# Patient Record
Sex: Male | Born: 1950 | Race: White | Hispanic: No | Marital: Married | Smoking: Never smoker
Health system: Southern US, Community
[De-identification: ages and names within clinical notes are randomized; demographics above are authoritative.]

## PROBLEM LIST (undated history)

## (undated) DIAGNOSIS — K4090 Unilateral inguinal hernia, without obstruction or gangrene, not specified as recurrent: Secondary | ICD-10-CM

## (undated) DIAGNOSIS — N401 Enlarged prostate with lower urinary tract symptoms: Secondary | ICD-10-CM

## (undated) DIAGNOSIS — H5213 Myopia, bilateral: Secondary | ICD-10-CM

## (undated) DIAGNOSIS — Z8669 Personal history of other diseases of the nervous system and sense organs: Secondary | ICD-10-CM

## (undated) DIAGNOSIS — F32A Depression, unspecified: Secondary | ICD-10-CM

## (undated) DIAGNOSIS — F329 Major depressive disorder, single episode, unspecified: Secondary | ICD-10-CM

## (undated) DIAGNOSIS — Z981 Arthrodesis status: Secondary | ICD-10-CM

## (undated) DIAGNOSIS — K635 Polyp of colon: Secondary | ICD-10-CM

## (undated) DIAGNOSIS — J309 Allergic rhinitis, unspecified: Secondary | ICD-10-CM

## (undated) DIAGNOSIS — K219 Gastro-esophageal reflux disease without esophagitis: Secondary | ICD-10-CM

## (undated) DIAGNOSIS — T7840XA Allergy, unspecified, initial encounter: Secondary | ICD-10-CM

## (undated) DIAGNOSIS — Z800 Family history of malignant neoplasm of digestive organs: Secondary | ICD-10-CM

## (undated) DIAGNOSIS — E785 Hyperlipidemia, unspecified: Secondary | ICD-10-CM

## (undated) DIAGNOSIS — K649 Unspecified hemorrhoids: Secondary | ICD-10-CM

## (undated) DIAGNOSIS — I219 Acute myocardial infarction, unspecified: Secondary | ICD-10-CM

## (undated) DIAGNOSIS — H52203 Unspecified astigmatism, bilateral: Secondary | ICD-10-CM

## (undated) DIAGNOSIS — I2510 Atherosclerotic heart disease of native coronary artery without angina pectoris: Secondary | ICD-10-CM

## (undated) DIAGNOSIS — Z8739 Personal history of other diseases of the musculoskeletal system and connective tissue: Secondary | ICD-10-CM

## (undated) DIAGNOSIS — I4510 Unspecified right bundle-branch block: Secondary | ICD-10-CM

## (undated) DIAGNOSIS — D649 Anemia, unspecified: Secondary | ICD-10-CM

## (undated) DIAGNOSIS — Z961 Presence of intraocular lens: Secondary | ICD-10-CM

## (undated) DIAGNOSIS — N138 Other obstructive and reflux uropathy: Secondary | ICD-10-CM

## (undated) DIAGNOSIS — R972 Elevated prostate specific antigen [PSA]: Secondary | ICD-10-CM

## (undated) DIAGNOSIS — M1990 Unspecified osteoarthritis, unspecified site: Secondary | ICD-10-CM

## (undated) DIAGNOSIS — H269 Unspecified cataract: Secondary | ICD-10-CM

## (undated) DIAGNOSIS — D689 Coagulation defect, unspecified: Secondary | ICD-10-CM

## (undated) DIAGNOSIS — G43909 Migraine, unspecified, not intractable, without status migrainosus: Secondary | ICD-10-CM

## (undated) DIAGNOSIS — R7303 Prediabetes: Secondary | ICD-10-CM

## (undated) HISTORY — DX: Hyperlipidemia, unspecified: E78.5

## (undated) HISTORY — DX: Personal history of other diseases of the musculoskeletal system and connective tissue: Z87.39

## (undated) HISTORY — DX: Benign prostatic hyperplasia with lower urinary tract symptoms: N40.1

## (undated) HISTORY — PX: EYE SURGERY: SHX253

## (undated) HISTORY — PX: HERNIA REPAIR: SHX51

## (undated) HISTORY — PX: COLONOSCOPY: SHX174

## (undated) HISTORY — DX: Presence of intraocular lens: Z96.1

## (undated) HISTORY — DX: Myopia, bilateral: H52.13

## (undated) HISTORY — DX: Unspecified hemorrhoids: K64.9

## (undated) HISTORY — DX: Polyp of colon: K63.5

## (undated) HISTORY — DX: Atherosclerotic heart disease of native coronary artery without angina pectoris: I25.10

## (undated) HISTORY — DX: Unspecified right bundle-branch block: I45.10

## (undated) HISTORY — DX: Arthrodesis status: Z98.1

## (undated) HISTORY — DX: Allergic rhinitis, unspecified: J30.9

## (undated) HISTORY — PX: CORONARY STENT PLACEMENT: SHX1402

## (undated) HISTORY — DX: Unspecified cataract: H26.9

## (undated) HISTORY — DX: Depression, unspecified: F32.A

## (undated) HISTORY — DX: Unspecified osteoarthritis, unspecified site: M19.90

## (undated) HISTORY — DX: Acute myocardial infarction, unspecified: I21.9

## (undated) HISTORY — DX: Unilateral inguinal hernia, without obstruction or gangrene, not specified as recurrent: K40.90

## (undated) HISTORY — PX: SPINE SURGERY: SHX786

## (undated) HISTORY — DX: Unspecified astigmatism, bilateral: H52.203

## (undated) HISTORY — PX: HEMORRHOID SURGERY: SHX153

## (undated) HISTORY — DX: Major depressive disorder, single episode, unspecified: F32.9

## (undated) HISTORY — PX: POLYPECTOMY: SHX149

## (undated) HISTORY — DX: Prediabetes: R73.03

## (undated) HISTORY — DX: Anemia, unspecified: D64.9

## (undated) HISTORY — DX: Allergy, unspecified, initial encounter: T78.40XA

## (undated) HISTORY — DX: Personal history of other diseases of the nervous system and sense organs: Z86.69

## (undated) HISTORY — DX: Benign prostatic hyperplasia with lower urinary tract symptoms: N13.8

## (undated) HISTORY — PX: PROSTATE BIOPSY: SHX241

## (undated) HISTORY — PX: CATARACT EXTRACTION, BILATERAL: SHX1313

## (undated) HISTORY — PX: COLON SURGERY: SHX602

## (undated) HISTORY — DX: Coagulation defect, unspecified: D68.9

## (undated) HISTORY — DX: Family history of malignant neoplasm of digestive organs: Z80.0

## (undated) HISTORY — DX: Migraine, unspecified, not intractable, without status migrainosus: G43.909

## (undated) HISTORY — DX: Elevated prostate specific antigen (PSA): R97.20

## (undated) HISTORY — PX: RETINAL DETACHMENT SURGERY: SHX105

---
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
## 2000-05-27 ENCOUNTER — Ambulatory Visit (HOSPITAL_COMMUNITY): Admission: RE | Admit: 2000-05-27 | Discharge: 2000-05-27 | Payer: Self-pay | Admitting: Gastroenterology

## 2001-11-15 ENCOUNTER — Encounter: Payer: Self-pay | Admitting: Cardiovascular Disease

## 2001-11-15 ENCOUNTER — Observation Stay (HOSPITAL_COMMUNITY): Admission: EM | Admit: 2001-11-15 | Discharge: 2001-11-15 | Payer: Self-pay | Admitting: Emergency Medicine

## 2001-11-15 ENCOUNTER — Encounter: Payer: Self-pay | Admitting: Emergency Medicine

## 2002-07-20 DIAGNOSIS — D689 Coagulation defect, unspecified: Secondary | ICD-10-CM

## 2002-07-20 HISTORY — DX: Coagulation defect, unspecified: D68.9

## 2002-07-20 HISTORY — PX: CORONARY ARTERY BYPASS GRAFT: SHX141

## 2002-07-20 HISTORY — PX: CHOLECYSTECTOMY: SHX55

## 2003-01-07 ENCOUNTER — Encounter: Payer: Self-pay | Admitting: Emergency Medicine

## 2003-01-07 ENCOUNTER — Inpatient Hospital Stay (HOSPITAL_COMMUNITY): Admission: EM | Admit: 2003-01-07 | Discharge: 2003-01-17 | Payer: Self-pay | Admitting: Emergency Medicine

## 2003-01-09 ENCOUNTER — Encounter: Payer: Self-pay | Admitting: Surgery

## 2003-01-11 ENCOUNTER — Encounter: Payer: Self-pay | Admitting: Surgery

## 2003-01-12 ENCOUNTER — Encounter: Payer: Self-pay | Admitting: Surgery

## 2003-01-13 ENCOUNTER — Encounter: Payer: Self-pay | Admitting: Surgery

## 2003-03-24 ENCOUNTER — Encounter: Payer: Self-pay | Admitting: Emergency Medicine

## 2003-03-24 ENCOUNTER — Emergency Department (HOSPITAL_COMMUNITY): Admission: EM | Admit: 2003-03-24 | Discharge: 2003-03-24 | Payer: Self-pay | Admitting: Emergency Medicine

## 2003-03-30 ENCOUNTER — Ambulatory Visit (HOSPITAL_COMMUNITY): Admission: RE | Admit: 2003-03-30 | Discharge: 2003-03-31 | Payer: Self-pay | Admitting: General Surgery

## 2003-03-30 ENCOUNTER — Encounter: Payer: Self-pay | Admitting: General Surgery

## 2004-07-20 HISTORY — PX: CERVICAL FUSION: SHX112

## 2005-03-19 ENCOUNTER — Ambulatory Visit (HOSPITAL_COMMUNITY): Admission: RE | Admit: 2005-03-19 | Discharge: 2005-03-19 | Payer: Self-pay | Admitting: Gastroenterology

## 2005-03-25 ENCOUNTER — Encounter: Admission: RE | Admit: 2005-03-25 | Discharge: 2005-03-25 | Payer: Self-pay | Admitting: Gastroenterology

## 2005-06-29 ENCOUNTER — Encounter: Admission: RE | Admit: 2005-06-29 | Discharge: 2005-06-29 | Payer: Self-pay | Admitting: Gastroenterology

## 2006-02-03 ENCOUNTER — Encounter: Admission: RE | Admit: 2006-02-03 | Discharge: 2006-02-03 | Payer: Self-pay | Admitting: Gastroenterology

## 2006-04-01 ENCOUNTER — Ambulatory Visit (HOSPITAL_COMMUNITY): Admission: RE | Admit: 2006-04-01 | Discharge: 2006-04-02 | Payer: Self-pay | Admitting: Neurological Surgery

## 2006-04-19 ENCOUNTER — Encounter: Admission: RE | Admit: 2006-04-19 | Discharge: 2006-04-19 | Payer: Self-pay | Admitting: Neurological Surgery

## 2006-07-19 ENCOUNTER — Encounter: Admission: RE | Admit: 2006-07-19 | Discharge: 2006-07-19 | Payer: Self-pay | Admitting: Neurological Surgery

## 2010-09-19 DIAGNOSIS — K635 Polyp of colon: Secondary | ICD-10-CM

## 2010-09-19 HISTORY — DX: Polyp of colon: K63.5

## 2011-07-21 HISTORY — PX: CARDIOVASCULAR STRESS TEST: SHX262

## 2012-12-01 LAB — LIPID PANEL
Cholesterol: 156 (ref 0–200)
HDL: 49 (ref 35–70)
LDL Cholesterol: 82
Triglycerides: 124 (ref 40–160)

## 2012-12-01 LAB — BASIC METABOLIC PANEL
BUN: 17 (ref 4–21)
Creatinine: 1.1 (ref <=1.3)
Glucose: 94
Potassium: 4.6 (ref 3.4–5.3)
Sodium: 140 (ref 137–147)

## 2012-12-01 LAB — HEPATIC FUNCTION PANEL: ALT: 23 (ref 10–40)

## 2013-11-02 LAB — HM COLONOSCOPY

## 2016-05-08 LAB — HEMOGLOBIN A1C: Hemoglobin A1C: 5.8

## 2018-02-07 DIAGNOSIS — M2042 Other hammer toe(s) (acquired), left foot: Secondary | ICD-10-CM | POA: Diagnosis not present

## 2018-02-07 DIAGNOSIS — M205X1 Other deformities of toe(s) (acquired), right foot: Secondary | ICD-10-CM | POA: Diagnosis not present

## 2018-02-07 DIAGNOSIS — M205X2 Other deformities of toe(s) (acquired), left foot: Secondary | ICD-10-CM | POA: Diagnosis not present

## 2018-02-07 DIAGNOSIS — M79671 Pain in right foot: Secondary | ICD-10-CM | POA: Diagnosis not present

## 2018-02-07 DIAGNOSIS — M2041 Other hammer toe(s) (acquired), right foot: Secondary | ICD-10-CM | POA: Diagnosis not present

## 2018-02-07 DIAGNOSIS — L603 Nail dystrophy: Secondary | ICD-10-CM | POA: Diagnosis not present

## 2018-02-07 DIAGNOSIS — M79672 Pain in left foot: Secondary | ICD-10-CM | POA: Diagnosis not present

## 2018-02-11 ENCOUNTER — Encounter: Payer: Self-pay | Admitting: *Deleted

## 2018-02-14 ENCOUNTER — Telehealth: Payer: Self-pay | Admitting: *Deleted

## 2018-02-14 ENCOUNTER — Ambulatory Visit (INDEPENDENT_AMBULATORY_CARE_PROVIDER_SITE_OTHER): Payer: Medicare HMO | Admitting: Family Medicine

## 2018-02-14 ENCOUNTER — Encounter: Payer: Self-pay | Admitting: Family Medicine

## 2018-02-14 VITALS — BP 114/71 | HR 76 | Temp 98.5°F | Resp 16 | Ht 73.0 in | Wt 219.1 lb

## 2018-02-14 DIAGNOSIS — I252 Old myocardial infarction: Secondary | ICD-10-CM

## 2018-02-14 DIAGNOSIS — Z951 Presence of aortocoronary bypass graft: Secondary | ICD-10-CM | POA: Diagnosis not present

## 2018-02-14 DIAGNOSIS — Z800 Family history of malignant neoplasm of digestive organs: Secondary | ICD-10-CM

## 2018-02-14 DIAGNOSIS — Z86010 Personal history of colonic polyps: Secondary | ICD-10-CM | POA: Diagnosis not present

## 2018-02-14 DIAGNOSIS — Z860101 Personal history of adenomatous and serrated colon polyps: Secondary | ICD-10-CM

## 2018-02-14 NOTE — Progress Notes (Signed)
Office Note 02/14/2018  CC:  Chief Complaint  Patient presents with  . Establish Care    moved here 2-3 weeks ago from [place]   HPI:  Gavin [name] is a 67 y.o. male who is here to establish care. Lives in [place]. Went to [place] x 89yrs.  Then [place] x 6 yrs.  Has been 5-6 years since last stress test--done in [place].  This was a "follow up" stress test--he was asymptomatic. No hx of further cardiac sx's, LV function intact.  Exercise: walks when he can, active, no specific exercise routine. R foot pain, saw podiatrist last week, may need surgery on 2nd toe.  CPE with blood work done about 15 mo ago.   Past Medical History:  Diagnosis Date  . Allergic rhinitis   . CAD (coronary artery disease)    CABG x 4 2004 [facility]).  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  . Cataract    Bilat: has lens implants bilat.  . Colon polyp   . Depression   . Family history of colon cancer    Brother: in his 40s.  Marland Kitchen History of retinal detachment    OU  . Migraines   . Myocardial infarct ([place])    2004, when pt's stent clotted.    Past Surgical History:  Procedure Laterality Date  . CERVICAL FUSION  2006   C5/6  . CHOLECYSTECTOMY  2004  . COLONOSCOPY  2014   Repeat 5 yrs per pt report (hx of polyps).  . CORONARY ARTERY BYPASS GRAFT  2004   4  . TONSILLECTOMY  1956    Family History  Problem Relation Age of Onset  . Arthritis Mother   . Diabetes Mother   . Depression Father   . Heart attack Father   . Heart disease Father   . Hyperlipidemia Father   . Hypertension Father   . Stroke Father   . Cancer Brother   . Heart disease Brother   . Hyperlipidemia Brother   . Hypertension Brother   . Diabetes Maternal Grandmother   . Early death Paternal Grandfather     Social History   Socioeconomic History  . Marital status: Married    Spouse name: Not on file  . Number of children: Not on file  . Years of education:  Not on file  . Highest education level: Not on file  Occupational History  . Not on file  Social Needs  . Financial resource strain: Not on file  . Food insecurity:    Worry: Not on file    Inability: Not on file  . Transportation needs:    Medical: Not on file    Non-medical: Not on file  Tobacco Use  . Smoking status: Never Smoker  . Smokeless tobacco: Never Used  Substance and Sexual Activity  . Alcohol use: Yes  . Drug use: Never  . Sexual activity: Yes  Lifestyle  . Physical activity:    Days per week: Not on file    Minutes per session: Not on file  . Stress: Not on file  Relationships  . Social connections:    Talks on phone: Not on file    Gets together: Not on file    Attends religious service: Not on file    Active member of club or organization: Not on file    Attends meetings of clubs or organizations: Not on file    Relationship status: Not on file  .  Intimate partner violence:    Fear of current or ex partner: Not on file    Emotionally abused: Not on file    Physically abused: Not on file    Forced sexual activity: Not on file  Other Topics Concern  . Not on file  Social History Narrative   Married, 2 grown children.  No grandchildren.   Masters Degree: Marketing executive.   [name]; retired Chief Financial Officer.   Alc: 2 drinks per week.   No tob.    Outpatient Medications Prior to Visit  Medication Sig Dispense Refill  . aspirin EC 81 MG tablet Take 81 mg by mouth daily.    Marland Kitchen atorvastatin (LIPITOR) 40 MG tablet Take 40 mg by mouth daily.    . Cholecalciferol (VITAMIN D PO) Take 1 capsule by mouth daily.    . citalopram (CELEXA) 20 MG tablet Take 20 mg by mouth daily.    . Coenzyme Q10 (COQ-10) 200 MG CAPS Take 1 capsule by mouth daily.     No facility-administered medications prior to visit.     Allergies  Allergen Reactions  . Penicillins Anaphylaxis    ROS Review of Systems  Constitutional: Negative for fatigue and fever.  HENT: Negative for congestion  and sore throat.   Eyes: Negative for visual disturbance.  Respiratory: Negative for cough and chest tightness.   Cardiovascular: Negative for chest pain, palpitations and leg swelling.  Gastrointestinal: Negative for abdominal pain and nausea.  Genitourinary: Negative for dysuria.  Musculoskeletal: Negative for back pain and joint swelling.  Skin: Negative for rash.  Neurological: Negative for weakness and headaches.  Hematological: Negative for adenopathy.    PE; Blood pressure 114/71, pulse 76, temperature 98.5 F (36.9 C), temperature source Oral, resp. rate 16, height 6\' 1"  (1.854 m), weight 219 lb 2 oz (99.4 kg), SpO2 97 %. Gen: Alert, well appearing.  Patient is oriented to person, place, time, and situation. AFFECT: pleasant, lucid thought and speech. CV: RRR, no m/r/g.   LUNGS: CTA bilat, nonlabored resps, good aeration in all lung fields. ABD: soft, NT/ND EXT: no clubbing, cyanosis, or edema.   Pertinent labs:  No results found for: TSH No results found for: WBC, HGB, HCT, MCV, PLT No results found for: CREATININE, BUN, NA, K, CL, CO2 No results found for: ALT, AST, GGT, ALKPHOS, BILITOT No results found for: CHOL No results found for: HDL No results found for: LDLCALC No results found for: TRIG No results found for: CHOLHDL No results found for: PSA No results found for: HGBA1C   ASSESSMENT AND PLAN:   New pt; will obtain pertinent old records.  1) Coronary artery anomoly/anatomic variant?--pt ended up having an MI and then getting CABG after a stent occluded. Has been asymptomatic since that time (2007). Last stress test approx 5 yrs ago in [place]--he was asymptomatic and pt reports stress test was normal. Refer to local cardiologist: pt is interested to see if any f/u stress testing or echo is needed at this time. He takes ASA and statin as secondary prevention.  2) FH of colon ca in brother (in his 53s), with personal hx of colon polyps: last  colonoscopy 2014--he is due for repeat. Refer to local GI. Needs colonoscopy this year.  An After Visit Summary was printed and given to the patient.  FOLLOW UP:  Return in about 3 months (around 05/17/2018) for annual CPE (fasting).  Signed:  Crissie Sickles, MD           02/14/2018

## 2018-02-14 NOTE — Telephone Encounter (Signed)
Received medical records from [facility].   I reviewed records and abstracted information into pts chart.   Records have been placed on Dr. Idelle Leech desk for review.

## 2018-02-18 ENCOUNTER — Telehealth: Payer: Self-pay | Admitting: Internal Medicine

## 2018-02-18 DIAGNOSIS — L603 Nail dystrophy: Secondary | ICD-10-CM | POA: Diagnosis not present

## 2018-02-22 ENCOUNTER — Ambulatory Visit: Payer: Medicare HMO | Admitting: Cardiovascular Disease

## 2018-02-22 ENCOUNTER — Encounter: Payer: Self-pay | Admitting: Cardiovascular Disease

## 2018-02-22 DIAGNOSIS — E785 Hyperlipidemia, unspecified: Secondary | ICD-10-CM | POA: Insufficient documentation

## 2018-02-22 DIAGNOSIS — E7800 Pure hypercholesterolemia, unspecified: Secondary | ICD-10-CM

## 2018-02-22 DIAGNOSIS — I2510 Atherosclerotic heart disease of native coronary artery without angina pectoris: Secondary | ICD-10-CM | POA: Insufficient documentation

## 2018-02-22 LAB — LIPID PANEL
Chol/HDL Ratio: 2.4 ratio (ref 0.0–5.0)
Cholesterol, Total: 114 mg/dL (ref 100–199)
HDL: 47 mg/dL (ref >39)
LDL Calculated: 44 mg/dL (ref 0–99)
Triglycerides: 113 mg/dL (ref 0–149)
VLDL Cholesterol Cal: 23 mg/dL (ref 5–40)

## 2018-02-22 LAB — HEPATIC FUNCTION PANEL
ALT: 29 IU/L (ref 0–44)
AST: 32 IU/L (ref 0–40)
Albumin: 4.2 g/dL (ref 3.6–4.8)
Alkaline Phosphatase: 77 IU/L (ref 39–117)
Bilirubin Total: 1.3 mg/dL — ABNORMAL HIGH (ref 0.0–1.2)
Bilirubin, Direct: 0.35 mg/dL (ref 0.00–0.40)
Total Protein: 6.1 g/dL (ref 6.0–8.5)

## 2018-02-22 NOTE — Progress Notes (Signed)
02/22/2018 Gavin [name]   [birth year]  [id number]  Primary Physician McGowen, Gavin Blackwater, MD Primary Cardiologist: Lorretta Harp MD Gavin [name], [place]  HPI:  Gavin [name] is a 67 y.o. mildly overweight married Caucasian male father 2 children their 79s referred by Dr. Anitra [name] to be established in my practice for ongoing care because of known disease.  He is a retired Solicitor.  His risk factors include treated hyperlipidemia as well as family history with a brother who had a myocardial infarction.  He had a stent placed in [place] work on his LAD in 1994.  He had coronary artery bypass grafting x4 in 2004 at [facility] after myocardial infarction.  He had a stress test along the way most recently at [facility] in 2013 which was nonischemic with an EF of 70%.  He is otherwise asymptomatic.   Current Meds  Medication Sig  . aspirin EC 81 MG tablet Take 81 mg by mouth daily.  Marland Kitchen atorvastatin (LIPITOR) 40 MG tablet Take 40 mg by mouth daily.  . Cholecalciferol (VITAMIN D PO) Take 1 capsule by mouth daily.  . citalopram (CELEXA) 20 MG tablet Take 20 mg by mouth daily.  . Coenzyme Q10 (COQ-10) 200 MG CAPS Take 1 capsule by mouth daily.     Allergies  Allergen Reactions  . Penicillins Anaphylaxis    Social History   Socioeconomic History  . Marital status: Married    Spouse name: Not on file  . Number of children: Not on file  . Years of education: Not on file  . Highest education level: Not on file  Occupational History  . Not on file  Social Needs  . Financial resource strain: Not on file  . Food insecurity:    Worry: Not on file    Inability: Not on file  . Transportation needs:    Medical: Not on file    Non-medical: Not on file  Tobacco Use  . Smoking status: Never Smoker  . Smokeless tobacco: Never Used  Substance and Sexual Activity  . Alcohol use: Yes  . Drug use: Never  . Sexual activity: Yes  Lifestyle  .  Physical activity:    Days per week: Not on file    Minutes per session: Not on file  . Stress: Not on file  Relationships  . Social connections:    Talks on phone: Not on file    Gets together: Not on file    Attends religious service: Not on file    Active member of club or organization: Not on file    Attends meetings of clubs or organizations: Not on file    Relationship status: Not on file  . Intimate partner violence:    Fear of current or ex partner: Not on file    Emotionally abused: Not on file    Physically abused: Not on file    Forced sexual activity: Not on file  Other Topics Concern  . Not on file  Social History Narrative   Married, 2 grown children.  No grandchildren.   Masters Degree: Marketing executive.   [name]; retired Chief Financial Officer.   Alc: 2 drinks per week.   No tob.     Review of Systems: General: negative for chills, fever, night sweats or weight changes.  Cardiovascular: negative for chest pain, dyspnea on exertion, edema, orthopnea, palpitations, paroxysmal nocturnal dyspnea or shortness of breath Dermatological: negative for rash Respiratory: negative for cough or wheezing Urologic: negative  for hematuria Abdominal: negative for nausea, vomiting, diarrhea, bright red blood per rectum, melena, or hematemesis Neurologic: negative for visual changes, syncope, or dizziness All other systems reviewed and are otherwise negative except as noted above.    Blood pressure 120/62, pulse 73, height 6\' 1"  (1.854 m), weight 215 lb (97.5 kg).  General appearance: alert and no distress Neck: no adenopathy, no carotid bruit, no JVD, supple, symmetrical, trachea midline and thyroid not enlarged, symmetric, no tenderness/mass/nodules Lungs: clear to auscultation bilaterally Heart: regular rate and rhythm, S1, S2 normal, no murmur, click, rub or gallop Extremities: extremities normal, atraumatic, no cyanosis or edema Pulses: 2+ and symmetric Skin: Skin color, texture, turgor  normal. No rashes or lesions Neurologic: Alert and oriented X 3, normal strength and tone. Normal symmetric reflexes. Normal coordination and gait  EKG sinus rhythm at 73 with right bundle branch block.  I personally reviewed this EKG  ASSESSMENT AND PLAN:   Hyperlipidemia History of hyperlipidemia on statin therapy.  We will recheck a lipid liver profile.  Coronary artery disease History of CAD status post LAD stenting in [place] in 1994 with a bare-metal stent.  He had a myocardial infarction in 2004 at [facility] and underwent coronary artery bypass grafting x4 at that time.  He said stress test along the way since that time most recently in 2013 at [facility] which was nonischemic with an EF of 70%.  He is on low-dose aspirin and denies chest pain or shortness of breath.      Lorretta Harp MD FACP,FACC,FAHA, [facility] 02/22/2018 10:54 AM

## 2018-02-22 NOTE — Patient Instructions (Signed)
Medication Instructions:  Your physician recommends that you continue on your current medications as directed. Please refer to the Current Medication list given to you today.   Labwork: Your physician recommends that you return for lab work in: FASTING   Testing/Procedures: none  Follow-Up: Your physician wants you to follow-up in: 12 months with Dr. Gwenlyn Found. You will receive a reminder letter in the mail two months in advance. If you don't receive a letter, please call our office to schedule the follow-up appointment.   Any Other Special Instructions Will Be Listed Below (If Applicable).     If you need a refill on your cardiac medications before your next appointment, please call your pharmacy.

## 2018-02-22 NOTE — Assessment & Plan Note (Signed)
History of CAD status post LAD stenting in [place] in 1994 with a bare-metal stent.  He had a myocardial infarction in 2004 at [facility] and underwent coronary artery bypass grafting x4 at that time.  He said stress test along the way since that time most recently in 2013 at [facility] which was nonischemic with an EF of 70%.  He is on low-dose aspirin and denies chest pain or shortness of breath.

## 2018-02-22 NOTE — Assessment & Plan Note (Signed)
History of hyperlipidemia on statin therapy.  We will recheck a lipid liver profile 

## 2018-02-24 ENCOUNTER — Encounter: Payer: Self-pay | Admitting: *Deleted

## 2018-03-03 ENCOUNTER — Encounter: Payer: Self-pay | Admitting: Internal Medicine

## 2018-03-06 ENCOUNTER — Encounter: Payer: Self-pay | Admitting: Family Medicine

## 2018-03-07 ENCOUNTER — Encounter: Payer: Self-pay | Admitting: Family Medicine

## 2018-03-10 ENCOUNTER — Encounter: Payer: Self-pay | Admitting: Family Medicine

## 2018-03-10 MED ORDER — ATORVASTATIN CALCIUM 40 MG PO TABS: 40.0000 mg | ORAL_TABLET | Freq: Every day | ORAL | 1 refills | Status: DC

## 2018-03-10 MED ORDER — CITALOPRAM HYDROBROMIDE 20 MG PO TABS
20.0000 mg | ORAL_TABLET | Freq: Every day | ORAL | 1 refills | Status: DC
Start: 2018-03-10 — End: 2018-09-08

## 2018-03-10 NOTE — Telephone Encounter (Signed)
New pt, we have not filled these medications yet.   Please advise. Thanks.

## 2018-03-10 NOTE — Telephone Encounter (Signed)
Cital and atorva eRx'd.

## 2018-03-24 ENCOUNTER — Encounter: Payer: Self-pay | Admitting: Family Medicine

## 2018-04-05 DIAGNOSIS — Z8249 Family history of ischemic heart disease and other diseases of the circulatory system: Secondary | ICD-10-CM | POA: Diagnosis not present

## 2018-04-05 DIAGNOSIS — I2510 Atherosclerotic heart disease of native coronary artery without angina pectoris: Secondary | ICD-10-CM | POA: Diagnosis not present

## 2018-04-05 DIAGNOSIS — Z809 Family history of malignant neoplasm, unspecified: Secondary | ICD-10-CM | POA: Diagnosis not present

## 2018-04-05 DIAGNOSIS — I252 Old myocardial infarction: Secondary | ICD-10-CM | POA: Diagnosis not present

## 2018-04-05 DIAGNOSIS — N529 Male erectile dysfunction, unspecified: Secondary | ICD-10-CM | POA: Diagnosis not present

## 2018-04-05 DIAGNOSIS — Z7982 Long term (current) use of aspirin: Secondary | ICD-10-CM | POA: Diagnosis not present

## 2018-04-05 DIAGNOSIS — E785 Hyperlipidemia, unspecified: Secondary | ICD-10-CM | POA: Diagnosis not present

## 2018-04-05 DIAGNOSIS — R69 Illness, unspecified: Secondary | ICD-10-CM | POA: Diagnosis not present

## 2018-04-05 DIAGNOSIS — Z880 Allergy status to penicillin: Secondary | ICD-10-CM | POA: Diagnosis not present

## 2018-04-05 DIAGNOSIS — Z823 Family history of stroke: Secondary | ICD-10-CM | POA: Diagnosis not present

## 2018-04-07 ENCOUNTER — Encounter: Payer: Self-pay | Admitting: Internal Medicine

## 2018-04-07 ENCOUNTER — Ambulatory Visit (AMBULATORY_SURGERY_CENTER): Payer: Self-pay | Admitting: *Deleted

## 2018-04-07 ENCOUNTER — Ambulatory Visit (INDEPENDENT_AMBULATORY_CARE_PROVIDER_SITE_OTHER): Payer: Medicare HMO

## 2018-04-07 VITALS — Ht 72.5 in | Wt 219.0 lb

## 2018-04-07 DIAGNOSIS — Z23 Encounter for immunization: Secondary | ICD-10-CM | POA: Diagnosis not present

## 2018-04-07 DIAGNOSIS — Z86010 Personal history of colonic polyps: Secondary | ICD-10-CM

## 2018-04-07 DIAGNOSIS — Z800 Family history of malignant neoplasm of digestive organs: Secondary | ICD-10-CM

## 2018-04-07 MED ORDER — PEG 3350-KCL-NA BICARB-NACL 420 G PO SOLR: 4000.0000 mL | Freq: Once | ORAL | 0 refills | Status: AC

## 2018-04-07 NOTE — Progress Notes (Signed)
No egg or soy allergy known to patient  No issues with past sedation with any surgeries  or procedures, no intubation problems  No diet pills per patient No home 02 use per patient  No blood thinners per patient  Pt denies issues with constipation  No A fib or A flutter  EMMI video sent to pt's e mail pt declined   

## 2018-04-20 ENCOUNTER — Ambulatory Visit: Payer: Self-pay

## 2018-04-20 ENCOUNTER — Encounter: Payer: Self-pay | Admitting: Family Medicine

## 2018-04-20 NOTE — Telephone Encounter (Signed)
  Reason for Disposition . Ear congestion present > 48 hours  Answer Assessment - Initial Assessment Questions 1. LOCATION: "Which ear is involved?"       Left ear 2. SENSATION: "Describe how the ear feels."      Feels stopped up; if pinches nose and forcing to blow out, can hear fluid in ear 3. ONSET:  "When did the ear symptoms start?"      about 3 weeks; peristent 4. PAIN: "Do you also have an earache?" If so, ask: "How bad is it?" (Scale 1-10; or mild, moderate, severe)    Denied pain in ear; intermittent headache that is worse at night 5. CAUSE: "What do you think is causing the ear congestion?"     Unsure 6. URI: "Do you have a runny nose or cough?"      Some runny nose 7. NASAL ALLERGIES: "Are there symptoms of hay fever, such as sneezing or a clear nasal discharge?"     Sneezing; clear nasal drainage; itchy watery eyes, and some cough intermittent; denied fever/ chills, intermittent dizziness  Protocols used: EAR - CONGESTION-A-AH

## 2018-04-20 NOTE — Telephone Encounter (Signed)
Phone call to pt. based on request for appt. through [facility], for c/o fluid in ear and dizziness.  Reported he has had fluid in his (L) ear for about 3  weeks.  C/o intermittent headache, and intermittent dizziness, that is worse at night.  Stated the dizziness is worse with changing position from laying to standing.  Reported he has feeling like his eyes are spinning in his head at times.  Denied feeling faint.  Stated his headache feels sharp at times, "like a caffeine headache." Reported he can hear fluid in left ear with yawning, and pinching nose, and forcing pressure by blowing his nose. Denied any earache, fever/ chills.  Reported intermittent clear nasal drainage.  Stated he does not feel like his sinuses are congested.  Reported intermittent sneezing, itchy/ watery eyes, and cough.  Has been taking "Mucinex" and "Aller-clear" for his symptoms.  Stated "I'm prone to Sinusitis, but I don't have the pressure in my cheeks that I usually get with it."  Appt. scheduled on 04/21/18 with PCP.  Care advice given per protocol.  (note that Triage call was interrupted by computer locking up; returned call to pt. to complete Triage process)    Message   Please contact pt for triage.    ----- Message -----  From: [name]  Sent: 04/20/2018 11:31 AM EDT  To: Madelyn Brunner Pool  Subject: Appointment Request                 Appointment Request From: [name]    With Provider: Tammi Sou, MD [facility]]    Preferred Date Range: Any    Preferred Times: Any time    Reason for visit: Request an Appointment    Comments:  Fluid in ear. Dizziness

## 2018-04-21 ENCOUNTER — Ambulatory Visit: Payer: Medicare HMO | Admitting: Family Medicine

## 2018-04-22 ENCOUNTER — Ambulatory Visit (INDEPENDENT_AMBULATORY_CARE_PROVIDER_SITE_OTHER): Payer: Medicare HMO | Admitting: Family Medicine

## 2018-04-22 ENCOUNTER — Encounter: Payer: Self-pay | Admitting: Family Medicine

## 2018-04-22 VITALS — BP 100/63 | HR 72 | Temp 98.4°F | Resp 16 | Ht 73.0 in | Wt 220.5 lb

## 2018-04-22 DIAGNOSIS — H6982 Other specified disorders of Eustachian tube, left ear: Secondary | ICD-10-CM | POA: Diagnosis not present

## 2018-04-22 MED ORDER — FLUTICASONE PROPIONATE 50 MCG/ACT NA SUSP: 2.0000 | Freq: Every day | NASAL | 3 refills | Status: DC

## 2018-04-22 NOTE — Patient Instructions (Signed)
Use otc generic saline nasal spray 2-3 times per day to irrigate/moisturize your nasal passages.   

## 2018-04-22 NOTE — Progress Notes (Signed)
OFFICE VISIT  04/22/2018   CC:  Chief Complaint  Patient presents with  . Ear Problem    w/ dizziness    HPI:    Patient is a 67 y.o. Caucasian male who presents for ear complaint. Left ear feels full.  Some popping sensation/dulling sensation.  Forced air against closed nose makes things normal. Also if he yawns it clears it up.  No hearing impairment.  No fevers, no ear drainage. No recent URI sx's.  Intermittent nasal drainage with some occ cough---says this is not new.  This has provoked some mild, brief recurrences of some BPPV he has had for a long time intermittently, mild nausea at times with this.  No HAs.  Past Medical History:  Diagnosis Date  . Allergic rhinitis   . Allergy   . Anemia    30+ yrs ago   . 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 Astigmatism, bilateral   . Bilateral myopia   . CAD (coronary artery disease)    CABG x 4 2004 [facility]).  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  . Cataract    Bilat: has lens implants bilat.  . Clotting disorder ([place]) 2004  . Colon polyp 09/19/2010   adenomas, recall 3 yrs.  . Depression   . Family history of colon cancer    Brother: in his 82s.  . Hemorrhoids   . History of fusion of cervical spine   . History of left lateral epicondylitis   . History of retinal detachment    OU  . Migraines   . Myocardial infarct ([place])    2004, when pt's stent clotted.  He got CABG at [facility] at that time.  . Prediabetes   . Pseudophakia   . RBBB     Past Surgical History:  Procedure Laterality Date  . CARDIOVASCULAR STRESS TEST  2013   No ischemia.  EF 70%.  Marland Kitchen CATARACT EXTRACTION, BILATERAL     with lens implants  . CERVICAL FUSION  2006   C5/6  . CHOLECYSTECTOMY  2004  . COLONOSCOPY  11/02/2013   Repeat 3 yrs, adenomatouse polyps  . COLONOSCOPY  09/19/2010   repeat 3 yrs, benign adanomas  . CORONARY ARTERY BYPASS GRAFT  2004   CABGx4  . CORONARY STENT PLACEMENT     BMS in 1990s.  Marland Kitchen  POLYPECTOMY    . RETINAL DETACHMENT SURGERY N/A    2011 & 2013 both eyes affected  . TONSILLECTOMY  1956    Outpatient Medications Prior to Visit  Medication Sig Dispense Refill  . aspirin EC 81 MG tablet Take 81 mg by mouth daily.    Marland Kitchen atorvastatin (LIPITOR) 40 MG tablet Take 1 tablet (40 mg total) by mouth daily. 90 tablet 1  . Cholecalciferol (VITAMIN D PO) Take 1 capsule by mouth daily.    . citalopram (CELEXA) 20 MG tablet Take 1 tablet (20 mg total) by mouth daily. 90 tablet 1  . Coenzyme Q10 (COQ-10) 200 MG CAPS Take 1 capsule by mouth daily.     No facility-administered medications prior to visit.     Allergies  Allergen Reactions  . Penicillins Anaphylaxis    ROS As per HPI  PE: Blood pressure 100/63, pulse 72, temperature 98.4 F (36.9 C), temperature source Oral, resp. rate 16, height 6\' 1"  (1.854 m), weight 220 lb 8 oz (100 kg), SpO2 98 %. ZOX:WRUE: no injection, icteris, swelling, or exudate.  EOMI, PERRLA.  No nystagmus. Mouth: lips without lesion/swelling.  Oral mucosa pink and moist. Oropharynx without erythema, exudate, or swelling.  Nose: pretty clear on R, mild congestion/obstruction when doing deep nasal inhalation on L.  EACs clear/normal. TMs both normal, neither of them are mobile when pt blows forcefully against closed nostrils.   LABS:    Chemistry      Component Value Date/Time   NA 140 12/01/2012   K 4.6 12/01/2012   BUN 17 12/01/2012   CREATININE 1.1 12/01/2012   GLU 94 12/01/2012      Component Value Date/Time   ALKPHOS 77 02/22/2018 1112   AST 32 02/22/2018 1112   ALT 29 02/22/2018 1112   BILITOT 1.3 (H) 02/22/2018 1112     No results found for: PSA   IMPRESSION AND PLAN:  Left eustachian tube dysfunction: flonase 2 sprays each nostril qd--rx given. Use otc generic saline nasal spray 2-3 times per day to irrigate/moisturize your nasal passages.  An After Visit Summary was printed and given to the patient.  FOLLOW UP: Return if  symptoms worsen or fail to improve.  Signed:  Crissie Sickles, MD           04/22/2018

## 2018-04-28 ENCOUNTER — Ambulatory Visit (AMBULATORY_SURGERY_CENTER): Payer: Medicare HMO | Admitting: Internal Medicine

## 2018-04-28 ENCOUNTER — Encounter: Payer: Self-pay | Admitting: Internal Medicine

## 2018-04-28 VITALS — BP 105/68 | HR 56 | Temp 97.3°F | Resp 20 | Ht 73.0 in | Wt 220.0 lb

## 2018-04-28 DIAGNOSIS — Z1211 Encounter for screening for malignant neoplasm of colon: Secondary | ICD-10-CM | POA: Diagnosis not present

## 2018-04-28 DIAGNOSIS — D122 Benign neoplasm of ascending colon: Secondary | ICD-10-CM | POA: Diagnosis not present

## 2018-04-28 DIAGNOSIS — K635 Polyp of colon: Secondary | ICD-10-CM

## 2018-04-28 DIAGNOSIS — Z86010 Personal history of colonic polyps: Secondary | ICD-10-CM | POA: Diagnosis not present

## 2018-04-28 DIAGNOSIS — D124 Benign neoplasm of descending colon: Secondary | ICD-10-CM

## 2018-04-28 DIAGNOSIS — D120 Benign neoplasm of cecum: Secondary | ICD-10-CM | POA: Diagnosis not present

## 2018-04-28 DIAGNOSIS — Z800 Family history of malignant neoplasm of digestive organs: Secondary | ICD-10-CM | POA: Diagnosis not present

## 2018-04-28 MED ORDER — SODIUM CHLORIDE 0.9 % IV SOLN: 500.0000 mL | Freq: Once | INTRAVENOUS | Status: DC

## 2018-04-28 NOTE — Patient Instructions (Signed)
Handouts given on polyps and hemorrhoids   YOU HAD AN ENDOSCOPIC PROCEDURE TODAY AT THE [facility]:   Refer to the procedure report that was given to you for any specific questions about what was found during the examination.  If the procedure report does not answer your questions, please call your gastroenterologist to clarify.  If you requested that your care partner not be given the details of your procedure findings, then the procedure report has been included in a sealed envelope for you to review at your convenience later.  YOU SHOULD EXPECT: Some feelings of bloating in the abdomen. Passage of more gas than usual.  Walking can help get rid of the air that was put into your GI tract during the procedure and reduce the bloating. If you had a lower endoscopy (such as a colonoscopy or flexible sigmoidoscopy) you may notice spotting of blood in your stool or on the toilet paper. If you underwent a bowel prep for your procedure, you may not have a normal bowel movement for a few days.  Please Note:  You might notice some irritation and congestion in your nose or some drainage.  This is from the oxygen used during your procedure.  There is no need for concern and it should clear up in a day or so.  SYMPTOMS TO REPORT IMMEDIATELY:   Following lower endoscopy (colonoscopy or flexible sigmoidoscopy):  Excessive amounts of blood in the stool  Significant tenderness or worsening of abdominal pains  Swelling of the abdomen that is new, acute  Fever of 100F or higher    For urgent or emergent issues, a gastroenterologist can be reached at any hour by calling [phone].   DIET:  We do recommend a small meal at first, but then you may proceed to your regular diet.  Drink plenty of fluids but you should avoid alcoholic beverages for 24 hours.  ACTIVITY:  You should plan to take it easy for the rest of today and you should NOT DRIVE or use heavy machinery until tomorrow (because of  the sedation medicines used during the test).    FOLLOW UP: Our staff will call the number listed on your records the next business day following your procedure to check on you and address any questions or concerns that you may have regarding the information given to you following your procedure. If we do not reach you, we will leave a message.  However, if you are feeling well and you are not experiencing any problems, there is no need to return our call.  We will assume that you have returned to your regular daily activities without incident.  If any biopsies were taken you will be contacted by phone or by letter within the next 1-3 weeks.  Please call us at [phone] if you have not heard about the biopsies in 3 weeks.    SIGNATURES/CONFIDENTIALITY: You and/or your care partner have signed paperwork which will be entered into your electronic medical record.  These signatures attest to the fact that that the information above on your After Visit Summary has been reviewed and is understood.  Full responsibility of the confidentiality of this discharge information lies with you and/or your care-partner. 

## 2018-04-28 NOTE — Op Note (Signed)
[facility] Patient Name: Gavin [name] Procedure Date: 04/28/2018 11:08 AM MRN: [id number] Endoscopist: Jerene Bears , MD Age: 67 Referring MD:  Date of Birth: Nov 06, 1950 Gender: Male Account #: [id number] Procedure:                Colonoscopy Indications:              High risk colon cancer surveillance: Personal                            history of multiple (3 or more) adenomas, Family                            history of colon cancer in a first-degree relative,                            Last colonoscopy: 2015 Medicines:                Monitored Anesthesia Care Procedure:                Pre-Anesthesia Assessment:                           - Prior to the procedure, a History and Physical                            was performed, and patient medications and                            allergies were reviewed. The patient's tolerance of                            previous anesthesia was also reviewed. The risks                            and benefits of the procedure and the sedation                            options and risks were discussed with the patient.                            All questions were answered, and informed consent                            was obtained. Prior Anticoagulants: The patient has                            taken no previous anticoagulant or antiplatelet                            agents. ASA Grade Assessment: III - A patient with                            severe systemic disease. After reviewing the risks  and benefits, the patient was deemed in                            satisfactory condition to undergo the procedure.                           After obtaining informed consent, the colonoscope                            was passed under direct vision. Throughout the                            procedure, the patient's blood pressure, pulse, and                            oxygen saturations were monitored  continuously. The                            Model CF-HQ190L [id number] scope was introduced                            through the anus and advanced to the cecum,                            identified by appendiceal orifice and ileocecal                            valve. The colonoscopy was performed without                            difficulty. The patient tolerated the procedure                            well. The quality of the bowel preparation was                            good. The ileocecal valve, appendiceal orifice, and                            rectum were photographed. Scope In: 11:16:34 AM Scope Out: 11:38:48 AM Scope Withdrawal Time: 0 hours 17 minutes 49 seconds  Total Procedure Duration: 0 hours 22 minutes 14 seconds  Findings:                 Skin tags were found on perianal exam.                           A 6 mm polyp was found in the cecum. The polyp was                            sessile. The polyp was removed with a cold snare.                            Resection and retrieval were complete.  Three sessile polyps were found in the ascending                            colon. The polyps were 3 to 5 mm in size. These                            polyps were removed with a cold snare. Resection                            and retrieval were complete.                           Three sessile polyps were found in the descending                            colon. The polyps were 1 to 2 mm in size. These                            polyps were removed with a cold biopsy forceps.                            Resection and retrieval were complete.                           Internal hemorrhoids were found during                            retroflexion. The hemorrhoids were small. Complications:            No immediate complications. Estimated Blood Loss:     Estimated blood loss was minimal. Impression:               - One 6 mm polyp in the cecum,  removed with a cold                            snare. Resected and retrieved.                           - Three 3 to 5 mm polyps in the ascending colon,                            removed with a cold snare. Resected and retrieved.                           - Three 1 to 2 mm polyps in the descending colon,                            removed with a cold biopsy forceps. Resected and                            retrieved.                           - Small  internal hemorrhoids. Recommendation:           - Patient has a contact number available for                            emergencies. The signs and symptoms of potential                            delayed complications were discussed with the                            patient. Return to normal activities tomorrow.                            Written discharge instructions were provided to the                            patient.                           - Resume previous diet.                           - Continue present medications.                           - Await pathology results.                           - Repeat colonoscopy is recommended for                            surveillance. The colonoscopy date will be                            determined after pathology results from today's                            exam become available for review. Jerene Bears, MD 04/28/2018 11:42:51 AM This report has been signed electronically.

## 2018-04-28 NOTE — Progress Notes (Signed)
Pt's states no medical or surgical changes since previsit or office visit. 

## 2018-04-28 NOTE — Progress Notes (Signed)
A/ox3 pleased with MAC, report to RN 

## 2018-04-28 NOTE — Progress Notes (Signed)
Called to room to assist during endoscopic procedure.  Patient ID and intended procedure confirmed with present staff. Received instructions for my participation in the procedure from the performing physician.  

## 2018-04-29 ENCOUNTER — Telehealth: Payer: Self-pay

## 2018-04-29 ENCOUNTER — Telehealth: Payer: Self-pay | Admitting: *Deleted

## 2018-04-29 NOTE — Telephone Encounter (Signed)
Unable to reach.

## 2018-05-04 ENCOUNTER — Encounter: Payer: Self-pay | Admitting: Internal Medicine

## 2018-05-09 DIAGNOSIS — Z0100 Encounter for examination of eyes and vision without abnormal findings: Secondary | ICD-10-CM | POA: Diagnosis not present

## 2018-05-11 ENCOUNTER — Encounter: Payer: Self-pay | Admitting: Family Medicine

## 2018-05-20 ENCOUNTER — Encounter: Payer: Self-pay | Admitting: Family Medicine

## 2018-05-20 ENCOUNTER — Ambulatory Visit (INDEPENDENT_AMBULATORY_CARE_PROVIDER_SITE_OTHER): Payer: Medicare HMO | Admitting: Family Medicine

## 2018-05-20 VITALS — BP 116/62 | HR 60 | Temp 98.4°F | Resp 16 | Ht 73.0 in | Wt 218.6 lb

## 2018-05-20 DIAGNOSIS — I2510 Atherosclerotic heart disease of native coronary artery without angina pectoris: Secondary | ICD-10-CM | POA: Diagnosis not present

## 2018-05-20 DIAGNOSIS — E663 Overweight: Secondary | ICD-10-CM

## 2018-05-20 DIAGNOSIS — R7303 Prediabetes: Secondary | ICD-10-CM | POA: Diagnosis not present

## 2018-05-20 DIAGNOSIS — Z125 Encounter for screening for malignant neoplasm of prostate: Secondary | ICD-10-CM

## 2018-05-20 DIAGNOSIS — Z0000 Encounter for general adult medical examination without abnormal findings: Secondary | ICD-10-CM

## 2018-05-20 LAB — PSA, MEDICARE: PSA: 1.18 ng/ml (ref 0.10–4.00)

## 2018-05-20 MED ORDER — ZOSTER VAC RECOMB ADJUVANTED 50 MCG/0.5ML IM SUSR: 0.5000 mL | Freq: Once | INTRAMUSCULAR | 1 refills | Status: AC

## 2018-05-20 NOTE — Progress Notes (Signed)
Office Note 05/20/2018  CC:  Chief Complaint  Patient presents with  . Annual Exam    Pt is fasting.     HPI:  Gavin [name] is a 67 y.o. White male who is here for annual health maintenance exam.  He voices concern about his bp being low today. Feels a little bit orthostatic dizzy at times but this has cleared up the last 2 days.  NO SOB, DOE, or CP.  No palpitations or presyncope. He eats plenty of sodium.  Says he eats and drinks well.  Says his normal resting bp is 105/60s.   Past Medical History:  Diagnosis Date  . Allergic rhinitis   . Allergy   . Anemia    30+ yrs ago   . 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 Astigmatism, bilateral   . Bilateral myopia   . CAD (coronary artery disease)    CABG x 4 2004 [facility]).  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  . Cataract    Bilat: has lens implants bilat.  . Clotting disorder ([place]) 2004  . Colon polyp 09/19/2010   adenomas, recall 3 yrs.  . Depression   . Family history of colon cancer    Brother: in his 29s.  . Hemorrhoids   . History of fusion of cervical spine   . History of left lateral epicondylitis   . History of retinal detachment    OU  . Migraines   . Myocardial infarct ([place])    2004, when pt's stent clotted.  He got CABG at [facility] at that time.  . Prediabetes   . Pseudophakia   . RBBB     Past Surgical History:  Procedure Laterality Date  . CARDIOVASCULAR STRESS TEST  2013   No ischemia.  EF 70%.  Marland Kitchen CATARACT EXTRACTION, BILATERAL     with lens implants  . CERVICAL FUSION  2006   C5/6  . CHOLECYSTECTOMY  2004  . COLONOSCOPY  11/02/2013   Repeat 3 yrs, adenomatouse polyps  . COLONOSCOPY  09/19/2010; 11/02/13; 04/28/18   2012: repeat 3 yrs, benign adanomas.  2015 same.  2019 same.  Recall 3 yrs.  . CORONARY ARTERY BYPASS GRAFT  2004   CABGx4  . CORONARY STENT PLACEMENT     BMS in 1990s.  Marland Kitchen POLYPECTOMY    . RETINAL DETACHMENT SURGERY N/A    2011 & 2013  both eyes affected  . TONSILLECTOMY  1956    Family History  Problem Relation Age of Onset  . Arthritis Mother   . Diabetes Mother   . Osteoporosis Mother   . Depression Father   . Heart attack Father   . Heart disease Father   . Hyperlipidemia Father   . Hypertension Father   . Stroke Father   . Aortic aneurysm Father   . Cancer Brother   . Heart disease Brother   . Hyperlipidemia Brother   . Hypertension Brother   . Colon cancer Brother 55  . Colon polyps Brother   . Diabetes Maternal Grandmother   . Early death Paternal Grandfather   . Colon cancer Brother   . Esophageal cancer Neg Hx   . Rectal cancer Neg Hx   . Stomach cancer Neg Hx     Social History   Socioeconomic History  . Marital status: Married    Spouse name: Not on file  . Number of children: Not on file  . Years of education:  Not on file  . Highest education level: Not on file  Occupational History  . Not on file  Social Needs  . Financial resource strain: Not on file  . Food insecurity:    Worry: Not on file    Inability: Not on file  . Transportation needs:    Medical: Not on file    Non-medical: Not on file  Tobacco Use  . Smoking status: Never Smoker  . Smokeless tobacco: Never Used  Substance and Sexual Activity  . Alcohol use: Yes    Alcohol/week: 5.0 standard drinks    Types: 5 Glasses of wine per week    Comment: 1 glass a wine a day   . Drug use: Never  . Sexual activity: Yes  Lifestyle  . Physical activity:    Days per week: Not on file    Minutes per session: Not on file  . Stress: Not on file  Relationships  . Social connections:    Talks on phone: Not on file    Gets together: Not on file    Attends religious service: Not on file    Active member of club or organization: Not on file    Attends meetings of clubs or organizations: Not on file    Relationship status: Not on file  . Intimate partner violence:    Fear of current or ex partner: Not on file    Emotionally  abused: Not on file    Physically abused: Not on file    Forced sexual activity: Not on file  Other Topics Concern  . Not on file  Social History Narrative   Married, 2 grown children.  No grandchildren.   Masters Degree: Marketing executive.   [name]; retired Chief Financial Officer.   Alc: 2 drinks per week.   No tob.    Outpatient Medications Prior to Visit  Medication Sig Dispense Refill  . aspirin EC 81 MG tablet Take 81 mg by mouth daily.    Marland Kitchen atorvastatin (LIPITOR) 40 MG tablet Take 1 tablet (40 mg total) by mouth daily. 90 tablet 1  . Cholecalciferol (VITAMIN D PO) Take 1 capsule by mouth daily.    . citalopram (CELEXA) 20 MG tablet Take 1 tablet (20 mg total) by mouth daily. 90 tablet 1  . Coenzyme Q10 (COQ-10) 200 MG CAPS Take 1 capsule by mouth daily.    . fluticasone (FLONASE) 50 MCG/ACT nasal spray Place 2 sprays into both nostrils daily. 16 g 3   No facility-administered medications prior to visit.     Allergies  Allergen Reactions  . Penicillins Anaphylaxis    ROS Review of Systems  Constitutional: Negative for appetite change, chills, fatigue and fever.  HENT: Negative for congestion, dental problem, ear pain and sore throat.   Eyes: Negative for discharge, redness and visual disturbance.  Respiratory: Negative for cough, chest tightness, shortness of breath and wheezing.   Cardiovascular: Negative for chest pain, palpitations and leg swelling.  Gastrointestinal: Negative for abdominal pain, blood in stool, diarrhea, nausea and vomiting.  Genitourinary: Negative for difficulty urinating, dysuria, flank pain, frequency, hematuria and urgency.  Musculoskeletal: Negative for arthralgias, back pain, joint swelling, myalgias and neck stiffness.  Skin: Negative for pallor and rash.  Neurological: Negative for dizziness, speech difficulty, weakness and headaches.  Hematological: Negative for adenopathy. Does not bruise/bleed easily.  Psychiatric/Behavioral: Negative for confusion and sleep  disturbance. The patient is not nervous/anxious.     PE; initial bp 91/55, repeat manually 116/62. Blood pressure 116/62, pulse 60,  temperature 98.4 F (36.9 C), temperature source Oral, resp. rate 16, height 6\' 1"  (1.854 m), weight 218 lb 9 oz (99.1 kg), SpO2 97 %. Body mass index is 28.84 kg/m.   Gen: Alert, well appearing.  Patient is oriented to person, place, time, and situation. AFFECT: pleasant, lucid thought and speech. ENT: Ears: EACs clear, normal epithelium.  TMs with good light reflex and landmarks bilaterally.  Eyes: no injection, icteris, swelling, or exudate.  EOMI, PERRLA. Nose: no drainage or turbinate edema/swelling.  No injection or focal lesion.  Mouth: lips without lesion/swelling.  Oral mucosa pink and moist.  Dentition intact and without obvious caries or gingival swelling.  Oropharynx without erythema, exudate, or swelling.  Neck: supple/nontender.  No LAD, mass, or TM.  Carotid pulses 2+ bilaterally, without bruits. CV: RRR, no m/r/g.   LUNGS: CTA bilat, nonlabored resps, good aeration in all lung fields. ABD: soft, NT, ND, BS normal.  No hepatospenomegaly or mass.  No bruits. EXT: no clubbing, cyanosis, or edema.  Musculoskeletal: no joint swelling, erythema, warmth, or tenderness.  ROM of all joints intact. Skin - no sores or suspicious lesions or rashes or color changes Rectal exam: negative without mass, lesions or tenderness, PROSTATE EXAM: smooth and symmetric without nodules or tenderness.   Pertinent labs:  No results found for: TSH No results found for: WBC, HGB, HCT, MCV, PLT Lab Results  Component Value Date   CREATININE 1.1 12/01/2012   BUN 17 12/01/2012   NA 140 12/01/2012   K 4.6 12/01/2012   Lab Results  Component Value Date   ALT 29 02/22/2018   AST 32 02/22/2018   ALKPHOS 77 02/22/2018   BILITOT 1.3 (H) 02/22/2018   Lab Results  Component Value Date   CHOL 114 02/22/2018   Lab Results  Component Value Date   HDL 47 02/22/2018    Lab Results  Component Value Date   LDLCALC 44 02/22/2018   Lab Results  Component Value Date   TRIG 113 02/22/2018   Lab Results  Component Value Date   CHOLHDL 2.4 02/22/2018   Lab Results  Component Value Date   HGBA1C 5.8 05/08/2016    ASSESSMENT AND PLAN:   Health maintenance exam: Reviewed age and gender appropriate health maintenance issues (prudent diet, regular exercise, health risks of tobacco and excessive alcohol, use of seatbelts, fire alarms in home, use of sunscreen).  Also reviewed age and gender appropriate health screening as well as vaccine recommendations. Vaccines:  Shingrix-->rx printed for pt to take to pharmacy.   All UTD. Labs: CBC, BMET, HbA1c (prediabetes), PSA. Prostate ca screening:  DRE normal , PSA. Colon ca screening: next colonoscopy due 04/2021. Reassured pt regarding his low normal bp--no sign of illness, CV, or pulm problems.  This is simply his baseline.  An After Visit Summary was printed and given to the patient.  FOLLOW UP:  Return in about 6 months (around 11/18/2018) for routine chronic illness f/u.  Signed:  Crissie Sickles, MD           05/20/2018

## 2018-05-20 NOTE — Patient Instructions (Signed)

## 2018-05-21 LAB — CBC WITH DIFFERENTIAL/PLATELET
Basophils Absolute: 19 cells/uL (ref 0–200)
Basophils Relative: 0.3 %
Eosinophils Absolute: 122 cells/uL (ref 15–500)
Eosinophils Relative: 1.9 %
HCT: 45.2 % (ref 38.5–50.0)
Hemoglobin: 15.5 g/dL (ref 13.2–17.1)
Lymphs Abs: 1491 cells/uL (ref 850–3900)
MCH: 29.2 pg (ref 27.0–33.0)
MCHC: 34.3 g/dL (ref 32.0–36.0)
MCV: 85.3 fL (ref 80.0–100.0)
MPV: 10.6 fL (ref 7.5–12.5)
Monocytes Relative: 10.7 %
Neutro Abs: 4083 cells/uL (ref 1500–7800)
Neutrophils Relative %: 63.8 %
Platelets: 234 10*3/uL (ref 140–400)
RBC: 5.3 10*6/uL (ref 4.20–5.80)
RDW: 12.5 % (ref 11.0–15.0)
Total Lymphocyte: 23.3 %
WBC mixed population: 685 cells/uL (ref 200–950)
WBC: 6.4 10*3/uL (ref 3.8–10.8)

## 2018-05-21 LAB — BASIC METABOLIC PANEL
BUN: 18 mg/dL (ref 7–25)
CO2: 26 mmol/L (ref 20–32)
Calcium: 9.2 mg/dL (ref 8.6–10.3)
Chloride: 104 mmol/L (ref 98–110)
Creat: 1.25 mg/dL (ref 0.70–1.25)
Glucose, Bld: 106 mg/dL — ABNORMAL HIGH (ref 65–99)
Potassium: 4.6 mmol/L (ref 3.5–5.3)
Sodium: 140 mmol/L (ref 135–146)

## 2018-05-21 LAB — HEMOGLOBIN A1C
Hgb A1c MFr Bld: 5.8 % of total Hgb — ABNORMAL HIGH (ref <5.7)
Mean Plasma Glucose: 120 (calc)
eAG (mmol/L): 6.6 (calc)

## 2018-05-22 ENCOUNTER — Encounter: Payer: Self-pay | Admitting: Family Medicine

## 2018-09-08 ENCOUNTER — Other Ambulatory Visit: Payer: Self-pay | Admitting: Family Medicine

## 2018-11-18 ENCOUNTER — Ambulatory Visit: Payer: Medicare HMO | Admitting: Family Medicine

## 2018-11-18 ENCOUNTER — Encounter: Payer: Self-pay | Admitting: Family Medicine

## 2018-11-18 ENCOUNTER — Ambulatory Visit (INDEPENDENT_AMBULATORY_CARE_PROVIDER_SITE_OTHER): Payer: Medicare HMO | Admitting: Family Medicine

## 2018-11-18 VITALS — BP 132/70 | HR 56

## 2018-11-18 DIAGNOSIS — E7800 Pure hypercholesterolemia, unspecified: Secondary | ICD-10-CM | POA: Diagnosis not present

## 2018-11-18 DIAGNOSIS — I2510 Atherosclerotic heart disease of native coronary artery without angina pectoris: Secondary | ICD-10-CM

## 2018-11-18 DIAGNOSIS — Z8659 Personal history of other mental and behavioral disorders: Secondary | ICD-10-CM | POA: Diagnosis not present

## 2018-11-18 DIAGNOSIS — R69 Illness, unspecified: Secondary | ICD-10-CM | POA: Diagnosis not present

## 2018-11-18 NOTE — Progress Notes (Signed)
Virtual Visit via Video Note  I connected with Gavin [name] on 11/18/18 at  9:40 AM EDT by a video enabled telemedicine application and verified that I am speaking with the correct person using two identifiers.  Location patient: home Location provider:work or home office Persons participating in the virtual visit: patient, provider  I discussed the limitations of evaluation and management by telemedicine and the availability of in person appointments. The patient expressed understanding and agreed to proceed.  Telemedicine visit is a necessity given the COVID-19 restrictions in place at the current time.  Patient Care Team    Relationship Specialty Notifications Start End  Tammi Sou, MD PCP - General Family Medicine  02/14/18   Lorretta Harp, MD Consulting Physician Cardiology  03/02/18   Jerene Bears, MD Consulting Physician Gastroenterology  05/11/18    HPI: 68 y/o WM here for 6 mo f/u hypercholesterolemia, CAD (remote hx of CABG), and hx of MDD. Last visit his A1c was stable compared to 2 yrs ago (<6%).  Lytes/renal function normal and lipids excellent at that time.  Says he is doing pretty well. No complaints. Adapting to being on covid restrictions. Home bp's usually <130/80. No fevers. Wt monitoring: last wt was 222.  Was 218.5 last visit 6 mo ago. Eating higher carb diet lately. Exercise: Mt biking 2-4 miles on hills.  Compliant with statin every day.  Hx of some extended periods of problems with muscle cramps.  Mood is stable, sleep is good, appetite good. Some mild irritability since on covid restrictions but nothing unusual. Tolerating citalopram well.    ROS: no CP, no SOB, no wheezing, no cough, no dizziness, no HAs, no rashes, no melena/hematochezia.  No polyuria or polydipsia.  No myalgias or arthralgias.   Past Medical History:  Diagnosis Date  . Allergic rhinitis   . Allergy   . Anemia    30+ yrs ago   . 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 Astigmatism,  bilateral   . Bilateral myopia   . CAD (coronary artery disease)    CABG x 4 2004 [facility]).  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  . Cataract    Bilat: has lens implants bilat.  . Clotting disorder ([place]) 2004  . Colon polyp 09/19/2010   adenomas, recall 3 yrs.  . Depression   . Family history of colon cancer    Brother: in his 87s.  . Hemorrhoids   . History of fusion of cervical spine   . History of left lateral epicondylitis   . History of retinal detachment    OU  . Migraines   . Myocardial infarct ([place])    2004, when Gavin [name]'s stent clotted.  He got CAB[facility] at that time.  . Prediabetes    05/2018-->HbA1c 5.8% (unchanged compared to 2 yrs prior).  . Pseudophakia   . RBBB     Past Surgical History:  Procedure Laterality Date  . CARDIOVASCULAR STRESS TEST  2013   No ischemia.  EF 70%.  Marland Kitchen CATARACT EXTRACTION, BILATERAL     with lens implants  . CERVICAL FUSION  2006   C5/6  . CHOLECYSTECTOMY  2004  . COLONOSCOPY  09/19/2010; 11/02/13; 04/28/18   2012: repeat 3 yrs, benign adanomas.  2015 same.  2019 same.  Recall 3 yrs.  . CORONARY ARTERY BYPASS GRAFT  2004   CABGx4  . CORONARY STENT PLACEMENT     BMS in 1990s.  Marland Kitchen  POLYPECTOMY    . RETINAL DETACHMENT SURGERY N/A    2011 & 2013 both eyes affected  . TONSILLECTOMY  1956    Family History  Problem Relation Age of Onset  . Arthritis Mother   . Diabetes Mother   . Osteoporosis Mother   . Depression Father   . Heart attack Father   . Heart disease Father   . Hyperlipidemia Father   . Hypertension Father   . Stroke Father   . Aortic aneurysm Father   . Cancer Brother   . Heart disease Brother   . Hyperlipidemia Brother   . Hypertension Brother   . Colon cancer Brother 38  . Colon polyps Brother   . Diabetes Maternal Grandmother   . Early death Paternal Grandfather   . Colon cancer Brother   . Esophageal cancer Neg Hx   . Rectal cancer Neg Hx   . Stomach cancer  Neg Hx     SOCIAL HX: Married.  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   Current Outpatient Medications:  .  aspirin EC 81 MG tablet, Take 81 mg by mouth daily., Disp: , Rfl:  .  atorvastatin (LIPITOR) 40 MG tablet, TAKE 1 TABLET BY MOUTH EVERY DAY, Disp: 90 tablet, Rfl: 1 .  Cholecalciferol (VITAMIN D PO), Take 1 capsule by mouth daily., Disp: , Rfl:  .  citalopram (CELEXA) 20 MG tablet, TAKE 1 TABLET BY MOUTH EVERY DAY, Disp: 90 tablet, Rfl: 1 .  Coenzyme Q10 (COQ-10) 200 MG CAPS, Take 1 capsule by mouth daily., Disp: , Rfl:  .  fluticasone (FLONASE) 50 MCG/ACT nasal spray, Place 2 sprays into both nostrils daily., Disp: 16 g, Rfl: 3  EXAM:  VITALS per patient if applicable: BP 308/65 (BP Location: Left Arm, Patient Position: Sitting, Cuff Size: Normal)   Pulse (!) 56    GENERAL: alert, oriented, appears well and in no acute distress  HEENT: atraumatic, conjunttiva clear, no obvious abnormalities on inspection of external nose and ears  NECK: normal movements of the head and neck  LUNGS: on inspection no signs of respiratory distress, breathing rate appears normal, no obvious gross SOB, gasping or wheezing  CV: no obvious cyanosis  MS: moves all visible extremities without noticeable abnormality  PSYCH/NEURO: pleasant and cooperative, no obvious depression or anxiety, speech and thought processing grossly intact  LABS: none today    Chemistry      Component Value Date/Time   NA 140 05/20/2018 0840   NA 140 12/01/2012   K 4.6 05/20/2018 0840   CL 104 05/20/2018 0840   CO2 26 05/20/2018 0840   BUN 18 05/20/2018 0840   BUN 17 12/01/2012   CREATININE 1.25 05/20/2018 0840   GLU 94 12/01/2012      Component Value Date/Time   CALCIUM 9.2 05/20/2018 0840   ALKPHOS 77 02/22/2018 1112   AST 32 02/22/2018 1112   ALT 29 02/22/2018 1112   BILITOT 1.3 (H) 02/22/2018 1112     Lab Results  Component Value Date   HGBA1C 5.8 (H) 05/20/2018   Lab Results  Component  Value Date   WBC 6.4 05/20/2018   HGB 15.5 05/20/2018   HCT 45.2 05/20/2018   MCV 85.3 05/20/2018   PLT 234 05/20/2018   Lab Results  Component Value Date   CHOL 114 02/22/2018   HDL 47 02/22/2018   LDLCALC 44 02/22/2018   TRIG 113 02/22/2018   CHOLHDL 2.4 02/22/2018   Lab Results  Component Value Date  PSA 1.18 05/20/2018    ASSESSMENT AND PLAN:  Discussed the following assessment and plan:  1) CAD: hx of CABG. Asymptomatic. He exercises vigorously w/out any problems at all. Compliant with ASA and statin.  2) Hyperchol: tolerating statin.  I don't think his muscle cramping in the past is from his statin.  3) Hist of MDD: The current medical regimen is effective;  continue present plan and medications.  No labs are essential at this time.  I discussed the assessment and treatment plan with the patient. The patient was provided an opportunity to ask questions and all were answered. The patient agreed with the plan and demonstrated an understanding of the instructions.   The patient was advised to call back or seek an in-person evaluation if the symptoms worsen or if the condition fails to improve as anticipated.  F/u: CPE 6 mo  Signed:  Crissie Sickles, MD           11/18/2018

## 2019-01-02 ENCOUNTER — Telehealth: Payer: Self-pay | Admitting: *Deleted

## 2019-01-02 NOTE — Telephone Encounter (Signed)
No answer, busy

## 2019-02-17 ENCOUNTER — Telehealth: Payer: Self-pay | Admitting: *Deleted

## 2019-02-17 NOTE — Telephone Encounter (Signed)

## 2019-02-21 ENCOUNTER — Other Ambulatory Visit: Payer: Self-pay | Admitting: Family Medicine

## 2019-02-22 ENCOUNTER — Telehealth: Payer: Self-pay

## 2019-02-22 ENCOUNTER — Other Ambulatory Visit: Payer: Self-pay

## 2019-02-22 ENCOUNTER — Encounter: Payer: Self-pay | Admitting: Cardiovascular Disease

## 2019-02-22 ENCOUNTER — Telehealth (INDEPENDENT_AMBULATORY_CARE_PROVIDER_SITE_OTHER): Payer: Medicare HMO | Admitting: Cardiovascular Disease

## 2019-02-22 DIAGNOSIS — E782 Mixed hyperlipidemia: Secondary | ICD-10-CM

## 2019-02-22 DIAGNOSIS — I2510 Atherosclerotic heart disease of native coronary artery without angina pectoris: Secondary | ICD-10-CM | POA: Diagnosis not present

## 2019-02-22 NOTE — Progress Notes (Signed)
Virtual Visit via Video Note   This visit type was conducted due to national recommendations for restrictions regarding the COVID-19 Pandemic (e.g. social distancing) in an effort to limit this patient's exposure and mitigate transmission in our community.  Due to his co-morbid illnesses, this patient is at least at moderate risk for complications without adequate follow up.  This format is felt to be most appropriate for this patient at this time.  All issues noted in this document were discussed and addressed.  A limited physical exam was performed with this format.  Please refer to the patient's chart for his consent to telehealth for [facility].   Date:  02/22/2019   ID:  Gavin [name], DOB 05-19-1951, MRN [id number]  Patient Location: Home Provider Location: Home  PCP:  Tammi Sou, MD  Cardiologist: Dr. Quay Burow Electrophysiologist:  None   Evaluation Performed:  Follow-Up Visit  Chief Complaint: Follow-up CAD  History of Present Illness:    Gavin [name] is a 65 I last saw him in the office 02/22/2018.y.o. mildly overweight married Caucasian male father 2 children their 70s referred by Dr. Anitra Lauth to be established in my practice for ongoing care because of known disease.  He is a retired Solicitor.  His risk factors include treated hyperlipidemia as well as family history with a brother who had a myocardial infarction.  He had a stent placed in [place] work on his LAD in 1994.  He had coronary artery bypass grafting x4 in 2004 at [facility] after myocardial infarction.  He had a stress test along the way most recently at [facility] in 2013 which was nonischemic with an EF of 70%.    Since I saw him in the office a year ago he continues to do well.  He is completely asymptomatic.  Prior to COVID-19 and the hot weather he was bicycling fairly frequently without symptoms.  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  He currently is flying gliders..  The patient does not have symptoms concerning for COVID-19 infection (fever, chills, cough, or new shortness of breath).    Past Medical History:  Diagnosis Date  . Allergic rhinitis   . Allergy   . Anemia    30+ yrs ago   . 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 Astigmatism, bilateral   . Bilateral myopia   . CAD (coronary artery disease)    CABG x 4 2004 [facility]).  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  . Cataract    Bilat: has lens implants bilat.  . Clotting disorder ([place]) 2004  . Colon polyp 09/19/2010   adenomas, recall 3 yrs.  . Depression   . Family history of colon cancer    Brother: in his 36s.  . Hemorrhoids   . History of fusion of cervical spine   . History of left lateral epicondylitis   . History of retinal detachment    OU  . Migraines   . Myocardial infarct ([place])    2004, when pt's stent clotted.  He got CABG at [facility] at that time.  . Prediabetes    05/2018-->HbA1c 5.8% (unchanged compared to 2 yrs prior).  . Pseudophakia   . RBBB    Past Surgical History:  Procedure Laterality Date  . CARDIOVASCULAR STRESS TEST  2013   No ischemia.  EF  70%.  Marland Kitchen CATARACT EXTRACTION, BILATERAL     with lens implants  . CERVICAL FUSION  2006   C5/6  . CHOLECYSTECTOMY  2004  . COLONOSCOPY  09/19/2010; 11/02/13; 04/28/18   2012: repeat 3 yrs, benign adanomas.  2015 same.  2019 same.  Recall 3 yrs.  . CORONARY ARTERY BYPASS GRAFT  2004   CABGx4  . CORONARY STENT PLACEMENT     BMS in 1990s.  Marland Kitchen POLYPECTOMY    . RETINAL DETACHMENT SURGERY N/A    2011 & 2013 both eyes affected  . TONSILLECTOMY  1956     No outpatient medications have been marked as taking for the 02/22/19 encounter (Appointment) with Lorretta Harp, MD.     Allergies:   Penicillins   Social History   Tobacco Use  . Smoking status: Never Smoker  . Smokeless tobacco: Never Used   Substance Use Topics  . Alcohol use: Yes    Alcohol/week: 5.0 standard drinks    Types: 5 Glasses of wine per week    Comment: 1 glass a wine a day   . Drug use: Never     Family Hx: The patient's family history includes Aortic aneurysm in his father; Arthritis in his mother; Cancer in his brother; Colon cancer in his brother; Colon cancer (age of onset: 36) in his brother; Colon polyps in his brother; Depression in his father; Diabetes in his maternal grandmother and mother; Early death in his paternal grandfather; Heart attack in his father; Heart disease in his brother and father; Hyperlipidemia in his brother and father; Hypertension in his brother and father; Osteoporosis in his mother; Stroke in his father. There is no history of Esophageal cancer, Rectal cancer, or Stomach cancer.  ROS:   Please see the history of present illness.     All other systems reviewed and are negative.   Prior CV studies:   The following studies were reviewed today:  None  Labs/Other Tests and Data Reviewed:    EKG:  No ECG reviewed.  Recent Labs: 02/22/2018: ALT 29 05/20/2018: BUN 18; Creat 1.25; Hemoglobin 15.5; Platelets 234; Potassium 4.6; Sodium 140   Recent Lipid Panel Lab Results  Component Value Date/Time   CHOL 114 02/22/2018 11:12 AM   TRIG 113 02/22/2018 11:12 AM   HDL 47 02/22/2018 11:12 AM   CHOLHDL 2.4 02/22/2018 11:12 AM   LDLCALC 44 02/22/2018 11:12 AM    Wt Readings from Last 3 Encounters:  05/20/18 218 lb 9 oz (99.1 kg)  04/28/18 220 lb (99.8 kg)  04/22/18 220 lb 8 oz (100 kg)     Objective:    Vital Signs:  There were no vitals taken for this visit.   VITAL SIGNS:  reviewed GEN:  no acute distress RESPIRATORY:  normal respiratory effort, symmetric expansion MUSCULOSKELETAL:  no obvious deformities. NEURO:  alert and oriented x 3, no obvious focal deficit PSYCH:  normal affect  ASSESSMENT & PLAN:    1. Coronary artery disease- history of CAD status post LAD  stenting in 1994 in [place] and subsequent CABG x4 at [facility] after a myocardial infarction in 2004.  His last stress test was in 2012 and was normal.  Is completely asymptomatic. 2. Hyperlipidemia- history of hyperlipidemia on atorvastatin with lipid profile performed 02/22/2018 revealing an LDL 44 and HDL 47.  COVID-19 Education: The signs and symptoms of COVID-19 were discussed with the patient and how to seek care for testing (follow up with PCP or arrange E-visit).  The importance of social distancing was discussed today.  Time:   Today, I have spent 8 minutes with the patient with telehealth technology discussing the above problems.     Medication Adjustments/Labs and Tests Ordered: Current medicines are reviewed at length with the patient today.  Concerns regarding medicines are outlined above.   Tests Ordered: No orders of the defined types were placed in this encounter.   Medication Changes: No orders of the defined types were placed in this encounter.   Follow Up:  In Person in 1 year(s)  Signed, Quay Burow, MD  02/22/2019 8:56 AM    [place]

## 2019-02-22 NOTE — Telephone Encounter (Signed)
Patient and/or DPR-approved person aware of 8/5 AVS instructions and verbalized understanding.  AVS TO BE RELEASED TO MYCHART 

## 2019-02-22 NOTE — Patient Instructions (Signed)

## 2019-04-05 ENCOUNTER — Ambulatory Visit (INDEPENDENT_AMBULATORY_CARE_PROVIDER_SITE_OTHER): Payer: Medicare HMO

## 2019-04-05 ENCOUNTER — Encounter: Payer: Self-pay | Admitting: Family Medicine

## 2019-04-05 DIAGNOSIS — Z23 Encounter for immunization: Secondary | ICD-10-CM | POA: Diagnosis not present

## 2019-05-01 NOTE — Progress Notes (Signed)
Subjective:   Gavin [name] is a 68 y.o. male who presents for an Initial Medicare Annual Wellness Visit.  Review of Systems  No ROS.  Medicare Wellness Virtual Visit.  See social history for additional risk factors.  Cardiac Risk Factors include: advanced age (>55mn, >>43women);family history of premature cardiovascular disease;male gender   Sleep patterns: Sleeps 8 hours.  Home Safety/Smoke Alarms: Feels safe in home. Smoke alarms in place.  Living environment; residence and Firearm Safety: Lives with wife in 2 story home. Rails in place.  Seat Belt Safety/Bike Helmet: Wears seat belt.    Male:   CCS-Colonoscopy 04/28/2018, polyps. Recall 3 years.  PSA-  Lab Results  Component Value Date   PSA 1.18 05/20/2018     Objective:    Today's Vitals   05/02/19 0955  BP: 104/60  Pulse: 67  SpO2: 97%  Height: 6' (1.829 m)   Body mass index is 29.7 kg/m.  Advanced Directives 05/02/2019  Does Patient Have a Medical Advance Directive? No  Would patient like information on creating a medical advance directive? No - Patient declined    Current Medications (verified) Outpatient Encounter Medications as of 05/02/2019  Medication Sig  . aspirin EC 81 MG tablet Take 81 mg by mouth daily.  .Marland Kitchenatorvastatin (LIPITOR) 40 MG tablet TAKE 1 TABLET BY MOUTH EVERY DAY  . Cholecalciferol (VITAMIN D PO) Take 1 capsule by mouth daily.  . citalopram (CELEXA) 20 MG tablet TAKE 1 TABLET BY MOUTH EVERY DAY  . Coenzyme Q10 (COQ-10) 200 MG CAPS Take 1 capsule by mouth daily.  . fluticasone (FLONASE) 50 MCG/ACT nasal spray Place 2 sprays into both nostrils daily.   No facility-administered encounter medications on file as of 05/02/2019.     Allergies (verified) Penicillins   History: Past Medical History:  Diagnosis Date  . Allergic rhinitis   . Allergy   . Anemia    30+ yrs ago   . 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 Astigmatism, bilateral   . Bilateral myopia   . CAD (coronary  artery disease)    CABG x 4 2004 ([facility].  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  . Cataract    Bilat: has lens implants bilat.  . Clotting disorder ([place]  . Colon polyp 09/19/2010   adenomas, recall 3 yrs.  . Depression   . Family history of colon cancer    Brother: in his 371s  . Hemorrhoids   . History of fusion of cervical spine   . History of left lateral epicondylitis   . History of retinal detachment    OU  . Migraines   . [place]    [place], when pt's stent clotted.  [facility]  . Prediabetes    05/2018-->HbA1c 5.8% (unchanged compared to 2 yrs prior).  . Pseudophakia   . RBBB    Past Surgical History:  Procedure Laterality Date  . CARDIOVASCULAR STRESS TEST  2013   No ischemia.  EF 70%.  .Marland KitchenCATARACT EXTRACTION, BILATERAL     with lens implants  . CERVICAL FUSION  2006   C5/6  . CHOLECYSTECTOMY  2004  . COLONOSCOPY  09/19/2010; 11/02/13; 04/28/18   2012: repeat 3 yrs, benign adanomas.  2015 same.  2019 same.  Recall 3 yrs.  . CORONARY ARTERY BYPASS GRAFT  2004   CABGx4  . CORONARY STENT PLACEMENT  BMS in 1990s.  Marland Kitchen POLYPECTOMY    . RETINAL DETACHMENT SURGERY N/A    2011 & 2013 both eyes affected  . TONSILLECTOMY  1956   Family History  Problem Relation Age of Onset  . Arthritis Mother   . Diabetes Mother   . Osteoporosis Mother   . Depression Father   . Heart attack Father   . Heart disease Father   . Hyperlipidemia Father   . Hypertension Father   . Stroke Father   . Aortic aneurysm Father   . Cancer Brother   . Heart disease Brother   . Hyperlipidemia Brother   . Hypertension Brother   . Colon cancer Brother 45  . Colon polyps Brother   . Diabetes Maternal Grandmother   . Early death Paternal Grandfather   . Colon cancer Brother   . Esophageal cancer Neg Hx   . Rectal cancer Neg Hx   . Stomach cancer Neg Hx    Social History   Socioeconomic History  .  Marital status: Married    Spouse name: Not on file  . Number of children: Not on file  . Years of education: Not on file  . Highest education level: Not on file  Occupational History  . Not on file  Social Needs  . Financial resource strain: Not on file  . Food insecurity    Worry: Not on file    Inability: Not on file  . Transportation needs    Medical: Not on file    Non-medical: Not on file  Tobacco Use  . Smoking status: Never Smoker  . Smokeless tobacco: Never Used  Substance and Sexual Activity  . Alcohol use: Yes    Alcohol/week: 5.0 standard drinks    Types: 5 Glasses of wine per week    Comment: 1 glass a wine a day   . Drug use: Never  . Sexual activity: Yes  Lifestyle  . Physical activity    Days per week: Not on file    Minutes per session: Not on file  . Stress: Not on file  Relationships  . Social Herbalist on phone: Not on file    Gets together: Not on file    Attends religious service: Not on file    Active member of club or organization: Not on file    Attends meetings of clubs or organizations: Not on file    Relationship status: Not on file  Other Topics Concern  . Not on file  Social History Narrative   Married, 2 grown children.  No grandchildren.   Masters Degree: Marketing executive.   [name]; retired Chief Financial Officer.   Alc: 2 drinks per week.   No tob.   Tobacco Counseling Counseling given: Not Answered   Activities of Daily Living In your present state of health, do you have any difficulty performing the following activities: 05/02/2019  Hearing? N  Vision? N  Difficulty concentrating or making decisions? N  Walking or climbing stairs? N  Dressing or bathing? N  Doing errands, shopping? N  Preparing Food and eating ? N  Using the Toilet? N  In the past six months, have you accidently leaked urine? N  Do you have problems with loss of bowel control? N  Managing your Medications? N  Managing your Finances? N  Housekeeping or managing  your Housekeeping? N  Some recent data might be hidden     Immunizations and Health Maintenance Immunization History  Administered Date(s) Administered  .  Fluad Quad(high Dose 65+) 04/05/2019  . Hepatitis A 02/07/2007, 07/11/2007  . Hepatitis B 01/07/2007, 02/07/2007, 07/11/2007  . IPV 01/07/2007  . Influenza, High Dose Seasonal PF 04/07/2018  . Influenza-Unspecified 05/09/2015, 03/27/2016, 04/28/2017  . MMR 01/07/2007  . PPD Test 07/20/2010  . Pneumococcal Conjugate-13 05/09/2015  . Pneumococcal Polysaccharide-23 03/28/2014  . Pneumococcal-Unspecified 07/21/2015  . Td 07/21/2015  . Tdap 03/28/2014  . Typhoid Live 01/07/2007, 09/09/2010  . Zoster 03/28/2014  . Zoster Recombinat (Shingrix) 06/19/2018, 09/16/2018   Health Maintenance Due  Topic Date Due  . Hepatitis C Screening  1950-08-03    Patient Care Team: Tammi Sou, MD as PCP - General (Family Medicine) Lorretta Harp, MD as Consulting Physician (Cardiology) Pyrtle, Lajuan Lines, MD as Consulting Physician (Gastroenterology)  Indicate any recent Medical Services you may have received from other than [facility] providers in the past year (date may be approximate).    Assessment:   This is a routine wellness examination for [place].  Hearing/Vision screen  Hearing Screening   '125Hz'  '250Hz'  '500Hz'  '1000Hz'  '2000Hz'  '3000Hz'  '4000Hz'  '6000Hz'  '8000Hz'   Right ear:           Left ear:           Comments: Able to hear conversational tones w/o difficulty. No issues reported.    Vision Screening Comments: Last exam 07/20/2017. [place]. Wears glasses. H/O cataract surgery. H/O torn retina  Dietary issues and exercise activities discussed: Current Exercise Habits: Home exercise routine, Type of exercise: walking;Other - see comments(bike riding, up to 7  miles), Frequency (Times/Week): 6, Exercise limited by: None identified   Diet (meal preparation, eat out, water intake, caffeinated beverages, dairy products, fruits and vegetables): Drinks  water. Avoids sugar, sodas, and juice.   Breakfast: granola and fruit (berries); coffee; Special K with strawberries; nuts Lunch: Protein and vegetables Dinner: Cheese, cracker, olives, wine  Goals    . Increase physical activity     Increase activity to lose weight. (Goal 220 lb)      Depression Screen PHQ 2/9 Scores 05/02/2019 11/18/2018 05/20/2018  PHQ - 2 Score 0 0 0  PHQ- 9 Score - 1 -    Fall Risk Fall Risk  05/02/2019 05/20/2018  Falls in the past year? 0 0  Number falls in past yr: 0 -  Injury with Fall? 0 -  Follow up Falls prevention discussed -     Cognitive Function: MMSE - Mini Mental State Exam 05/02/2019  Orientation to time 5  Orientation to Place 5  Registration 3  Attention/ Calculation 5  Recall 3  Language- name 2 objects 2  Language- repeat 1  Language- follow 3 step command 3  Language- read & follow direction 1  Write a sentence 1  Copy design 1  Total score 30        Screening Tests Health Maintenance  Topic Date Due  . Hepatitis C Screening  04-24-1951  . COLONOSCOPY  04/28/2021  . TETANUS/TDAP  07/20/2025  . INFLUENZA VACCINE  Completed  . PNA vac Low Risk Adult  Completed         Plan:    Bring a copy of your living will and/or healthcare power of attorney to your next office visit.  Continue doing brain stimulating activities (puzzles, reading, adult coloring books, staying active) to keep memory sharp.   I have personally reviewed and noted the following in the patient's chart:   . Medical and social history . Use of alcohol, tobacco or illicit drugs  .  Current medications and supplements . Functional ability and status . Nutritional status . Physical activity . Advanced directives . List of other physicians . Hospitalizations, surgeries, and ER visits in previous 12 months . Vitals . Screenings to include cognitive, depression, and falls . Referrals and appointments  In addition, I have reviewed and discussed with  patient certain preventive protocols, quality metrics, and best practice recommendations. A written personalized care plan for preventive services as well as general preventive health recommendations were provided to patient.     Gerilyn Nestle, RN   05/02/2019    F/U with PCP 05/19/2019.

## 2019-05-02 ENCOUNTER — Other Ambulatory Visit: Payer: Self-pay

## 2019-05-02 ENCOUNTER — Ambulatory Visit (INDEPENDENT_AMBULATORY_CARE_PROVIDER_SITE_OTHER): Payer: Medicare HMO

## 2019-05-02 VITALS — BP 104/60 | HR 67 | Ht 72.0 in | Wt 226.5 lb

## 2019-05-02 DIAGNOSIS — E669 Obesity, unspecified: Secondary | ICD-10-CM | POA: Diagnosis not present

## 2019-05-02 DIAGNOSIS — Z0000 Encounter for general adult medical examination without abnormal findings: Secondary | ICD-10-CM | POA: Diagnosis not present

## 2019-05-02 NOTE — Patient Instructions (Addendum)
Bring a copy of your living will and/or healthcare power of attorney to your next office visit. ° °Continue doing brain stimulating activities (puzzles, reading, adult coloring books, staying active) to keep memory sharp.  ° ° °Fall Prevention in the Home, Adult °Falls can cause injuries. They can happen to people of all ages. There are many things you can do to make your home safe and to help prevent falls. Ask for help when making these changes, if needed. °What actions can I take to prevent falls? °General Instructions °· Use good lighting in all rooms. Replace any light bulbs that burn out. °· Turn on the lights when you go into a dark area. Use night-lights. °· Keep items that you use often in easy-to-reach places. Lower the shelves around your home if necessary. °· Set up your furniture so you have a clear path. Avoid moving your furniture around. °· Do not have throw rugs and other things on the floor that can make you trip. °· Avoid walking on wet floors. °· If any of your floors are uneven, fix them. °· Add color or contrast paint or tape to clearly mark and help you see: °? Any grab bars or handrails. °? First and last steps of stairways. °? Where the edge of each step is. °· If you use a stepladder: °? Make sure that it is fully opened. Do not climb a closed stepladder. °? Make sure that both sides of the stepladder are locked into place. °? Ask someone to hold the stepladder for you while you use it. °· If there are any pets around you, be aware of where they are. °What can I do in the bathroom? ° °  ° °· Keep the floor dry. Clean up any water that spills onto the floor as soon as it happens. °· Remove soap buildup in the tub or shower regularly. °· Use non-skid mats or decals on the floor of the tub or shower. °· Attach bath mats securely with double-sided, non-slip rug tape. °· If you need to sit down in the shower, use a plastic, non-slip stool. °· Install grab bars by the toilet and in the tub and  shower. Do not use towel bars as grab bars. °What can I do in the bedroom? °· Make sure that you have a light by your bed that is easy to reach. °· Do not use any sheets or blankets that are too big for your bed. They should not hang down onto the floor. °· Have a firm chair that has side arms. You can use this for support while you get dressed. °What can I do in the kitchen? °· Clean up any spills right away. °· If you need to reach something above you, use a strong step stool that has a grab bar. °· Keep electrical cords out of the way. °· Do not use floor polish or wax that makes floors slippery. If you must use wax, use non-skid floor wax. °What can I do with my stairs? °· Do not leave any items on the stairs. °· Make sure that you have a light switch at the top of the stairs and the bottom of the stairs. If you do not have them, ask someone to add them for you. °· Make sure that there are handrails on both sides of the stairs, and use them. Fix handrails that are broken or loose. Make sure that handrails are as long as the stairways. °· Install non-slip stair treads on all   stairs in your home. °· Avoid having throw rugs at the top or bottom of the stairs. If you do have throw rugs, attach them to the floor with carpet tape. °· Choose a carpet that does not hide the edge of the steps on the stairway. °· Check any carpeting to make sure that it is firmly attached to the stairs. Fix any carpet that is loose or worn. °What can I do on the outside of my home? °· Use bright outdoor lighting. °· Regularly fix the edges of walkways and driveways and fix any cracks. °· Remove anything that might make you trip as you walk through a door, such as a raised step or threshold. °· Trim any bushes or trees on the path to your home. °· Regularly check to see if handrails are loose or broken. Make sure that both sides of any steps have handrails. °· Install guardrails along the edges of any raised decks and porches. °· Clear  walking paths of anything that might make someone trip, such as tools or rocks. °· Have any leaves, snow, or ice cleared regularly. °· Use sand or salt on walking paths during winter. °· Clean up any spills in your garage right away. This includes grease or oil spills. °What other actions can I take? °· Wear shoes that: °? Have a low heel. Do not wear high heels. °? Have rubber bottoms. °? Are comfortable and fit you well. °? Are closed at the toe. Do not wear open-toe sandals. °· Use tools that help you move around (mobility aids) if they are needed. These include: °? Canes. °? Walkers. °? Scooters. °? Crutches. °· Review your medicines with your doctor. Some medicines can make you feel dizzy. This can increase your chance of falling. °Ask your doctor what other things you can do to help prevent falls. °Where to find more information °· Centers for Disease Control and Prevention, [employer]: https://cdc.gov °· National Institute on Aging: https://go4life.nia.nih.gov °Contact a doctor if: °· You are afraid of falling at home. °· You feel weak, drowsy, or dizzy at home. °· You fall at home. °Summary °· There are many simple things that you can do to make your home safe and to help prevent falls. °· Ways to make your home safe include removing tripping hazards and installing grab bars in the bathroom. °· Ask for help when making these changes in your home. °This information is not intended to replace advice given to you by your health care provider. Make sure you discuss any questions you have with your health care provider. °Document Released: 05/02/2009 Document Revised: 10/27/2018 Document Reviewed: 02/18/2017 °Elsevier Patient Education © 2020 [employer] ° ° °Health Maintenance, Male °Adopting a healthy lifestyle and getting preventive care are important in promoting health and wellness. Ask your health care provider about: °· The right schedule for you to have regular tests and exams. °· Things you can do on your  own to prevent diseases and keep yourself healthy. °What should I know about diet, weight, and exercise? °Eat a healthy diet ° °· Eat a diet that includes plenty of vegetables, fruits, low-fat dairy products, and lean protein. °· Do not eat a lot of foods that are high in solid fats, added sugars, or sodium. °Maintain a healthy weight °Body mass index (BMI) is a measurement that can be used to identify possible weight problems. It estimates body fat based on height and weight. Your health care provider can help determine your BMI and help you   achieve or maintain a healthy weight. °Get regular exercise °Get regular exercise. This is one of the most important things you can do for your health. Most adults should: °· Exercise for at least 150 minutes each week. The exercise should increase your heart rate and make you sweat (moderate-intensity exercise). °· Do strengthening exercises at least twice a week. This is in addition to the moderate-intensity exercise. °· Spend less time sitting. Even light physical activity can be beneficial. °Watch cholesterol and blood lipids °Have your blood tested for lipids and cholesterol at 69 years of age, then have this test every 5 years. °You may need to have your cholesterol levels checked more often if: °· Your lipid or cholesterol levels are high. °· You are older than 69 years of age. °· You are at high risk for heart disease. °What should I know about cancer screening? °Many types of cancers can be detected early and may often be prevented. Depending on your health history and family history, you may need to have cancer screening at various ages. This may include screening for: °· Colorectal cancer. °· Prostate cancer. °· Skin cancer. °· Lung cancer. °What should I know about heart disease, diabetes, and high blood pressure? °Blood pressure and heart disease °· High blood pressure causes heart disease and increases the risk of stroke. This is more likely to develop in people  who have high blood pressure readings, are of African descent, or are overweight. °· Talk with your health care provider about your target blood pressure readings. °· Have your blood pressure checked: °? Every 3-5 years if you are 18-39 years of age. °? Every year if you are 40 years old or older. °· If you are between the ages of 65 and 75 and are a current or former smoker, ask your health care provider if you should have a one-time screening for abdominal aortic aneurysm (AAA). °Diabetes °Have regular diabetes screenings. This checks your fasting blood sugar level. Have the screening done: °· Once every three years after age 45 if you are at a normal weight and have a low risk for diabetes. °· More often and at a younger age if you are overweight or have a high risk for diabetes. °What should I know about preventing infection? °Hepatitis B °If you have a higher risk for hepatitis B, you should be screened for this virus. Talk with your health care provider to find out if you are at risk for hepatitis B infection. °Hepatitis C °Blood testing is recommended for: °· Everyone born from 1945 through 1965. °· Anyone with known risk factors for hepatitis C. °Sexually transmitted infections (STIs) °· You should be screened each year for STIs, including gonorrhea and chlamydia, if: °? You are sexually active and are younger than 69 years of age. °? You are older than 69 years of age and your health care provider tells you that you are at risk for this type of infection. °? Your sexual activity has changed since you were last screened, and you are at increased risk for chlamydia or gonorrhea. Ask your health care provider if you are at risk. °· Ask your health care provider about whether you are at high risk for HIV. Your health care provider may recommend a prescription medicine to help prevent HIV infection. If you choose to take medicine to prevent HIV, you should first get tested for HIV. You should then be tested  every 3 months for as long as you are taking the medicine. °  Follow these instructions at home: °Lifestyle °· Do not use any products that contain nicotine or tobacco, such as cigarettes, e-cigarettes, and chewing tobacco. If you need help quitting, ask your health care provider. °· Do not use street drugs. °· Do not share needles. °· Ask your health care provider for help if you need support or information about quitting drugs. °Alcohol use °· Do not drink alcohol if your health care provider tells you not to drink. °· If you drink alcohol: °? Limit how much you have to 0-2 drinks a day. °? Be aware of how much alcohol is in your drink. In the [place] one drink equals one 12 oz bottle of beer (355 mL), one 5 oz glass of wine (148 mL), or one 1½ oz glass of hard liquor (44 mL). °General instructions °· Schedule regular health, dental, and eye exams. °· Stay current with your vaccines. °· Tell your health care provider if: °? You often feel depressed. °? You have ever been abused or do not feel safe at home. °Summary °· Adopting a healthy lifestyle and getting preventive care are important in promoting health and wellness. °· Follow your health care provider's instructions about healthy diet, exercising, and getting tested or screened for diseases. °· Follow your health care provider's instructions on monitoring your cholesterol and blood pressure. °This information is not intended to replace advice given to you by your health care provider. Make sure you discuss any questions you have with your health care provider. °Document Released: 01/02/2008 Document Revised: 06/29/2018 Document Reviewed: 06/29/2018 °Elsevier Patient Education © 2020 [employer]. ° °

## 2019-05-07 NOTE — Progress Notes (Signed)
AWV reviewed and agree. Signed:  Crissie Sickles, MD           05/07/2019

## 2019-05-15 ENCOUNTER — Other Ambulatory Visit: Payer: Self-pay | Admitting: Family Medicine

## 2019-05-16 ENCOUNTER — Other Ambulatory Visit: Payer: Self-pay | Admitting: Family Medicine

## 2019-05-16 NOTE — Telephone Encounter (Signed)
Pt has enough to last until 10/30 appt. Last refill was 02/21/19 (90,0)

## 2019-05-19 ENCOUNTER — Encounter: Payer: Self-pay | Admitting: Family Medicine

## 2019-05-19 ENCOUNTER — Ambulatory Visit (INDEPENDENT_AMBULATORY_CARE_PROVIDER_SITE_OTHER): Payer: Medicare HMO | Admitting: Family Medicine

## 2019-05-19 ENCOUNTER — Other Ambulatory Visit: Payer: Self-pay

## 2019-05-19 VITALS — BP 117/74 | HR 62 | Temp 97.6°F | Resp 16 | Ht 72.5 in | Wt 226.2 lb

## 2019-05-19 DIAGNOSIS — R7303 Prediabetes: Secondary | ICD-10-CM

## 2019-05-19 DIAGNOSIS — Z125 Encounter for screening for malignant neoplasm of prostate: Secondary | ICD-10-CM

## 2019-05-19 DIAGNOSIS — K5909 Other constipation: Secondary | ICD-10-CM | POA: Diagnosis not present

## 2019-05-19 DIAGNOSIS — K6289 Other specified diseases of anus and rectum: Secondary | ICD-10-CM

## 2019-05-19 DIAGNOSIS — J309 Allergic rhinitis, unspecified: Secondary | ICD-10-CM

## 2019-05-19 DIAGNOSIS — I2510 Atherosclerotic heart disease of native coronary artery without angina pectoris: Secondary | ICD-10-CM | POA: Diagnosis not present

## 2019-05-19 DIAGNOSIS — R202 Paresthesia of skin: Secondary | ICD-10-CM

## 2019-05-19 DIAGNOSIS — Z0000 Encounter for general adult medical examination without abnormal findings: Secondary | ICD-10-CM | POA: Diagnosis not present

## 2019-05-19 DIAGNOSIS — I2583 Coronary atherosclerosis due to lipid rich plaque: Secondary | ICD-10-CM | POA: Diagnosis not present

## 2019-05-19 LAB — PSA, MEDICARE: PSA: 1.46 ng/ml (ref 0.10–4.00)

## 2019-05-19 MED ORDER — HYDROCORTISONE (PERIANAL) 2.5 % EX CREA: 1.0000 "application " | TOPICAL_CREAM | Freq: Two times a day (BID) | CUTANEOUS | 3 refills | Status: DC

## 2019-05-19 NOTE — Patient Instructions (Signed)
Try over the counter Miralax powder for your constipation.   Health Maintenance, Male Adopting a healthy lifestyle and getting preventive care are important in promoting health and wellness. Ask your health care provider about:  The right schedule for you to have regular tests and exams.  Things you can do on your own to prevent diseases and keep yourself healthy. What should I know about diet, weight, and exercise? Eat a healthy diet   Eat a diet that includes plenty of vegetables, fruits, low-fat dairy products, and lean protein.  Do not eat a lot of foods that are high in solid fats, added sugars, or sodium. Maintain a healthy weight Body mass index (BMI) is a measurement that can be used to identify possible weight problems. It estimates body fat based on height and weight. Your health care provider can help determine your BMI and help you achieve or maintain a healthy weight. Get regular exercise Get regular exercise. This is one of the most important things you can do for your health. Most adults should:  Exercise for at least 150 minutes each week. The exercise should increase your heart rate and make you sweat (moderate-intensity exercise).  Do strengthening exercises at least twice a week. This is in addition to the moderate-intensity exercise.  Spend less time sitting. Even light physical activity can be beneficial. Watch cholesterol and blood lipids Have your blood tested for lipids and cholesterol at 69 years of age, then have this test every 5 years. You may need to have your cholesterol levels checked more often if:  Your lipid or cholesterol levels are high.  You are older than 69 years of age.  You are at high risk for heart disease. What should I know about cancer screening? Many types of cancers can be detected early and may often be prevented. Depending on your health history and family history, you may need to have cancer screening at various ages. This may  include screening for:  Colorectal cancer.  Prostate cancer.  Skin cancer.  Lung cancer. What should I know about heart disease, diabetes, and high blood pressure? Blood pressure and heart disease  High blood pressure causes heart disease and increases the risk of stroke. This is more likely to develop in people who have high blood pressure readings, are of African descent, or are overweight.  Talk with your health care provider about your target blood pressure readings.  Have your blood pressure checked: ? Every 3-5 years if you are 77-59 years of age. ? Every year if you are 20 years old or older.  If you are between the ages of 34 and 49 and are a current or former smoker, ask your health care provider if you should have a one-time screening for abdominal aortic aneurysm (AAA). Diabetes Have regular diabetes screenings. This checks your fasting blood sugar level. Have the screening done:  Once every three years after age 2 if you are at a normal weight and have a low risk for diabetes.  More often and at a younger age if you are overweight or have a high risk for diabetes. What should I know about preventing infection? Hepatitis B If you have a higher risk for hepatitis B, you should be screened for this virus. Talk with your health care provider to find out if you are at risk for hepatitis B infection. Hepatitis C Blood testing is recommended for:  Everyone born from [birth year] through 1965.  Anyone with known risk factors for hepatitis C.  Sexually transmitted infections (STIs)  You should be screened each year for STIs, including gonorrhea and chlamydia, if: ? You are sexually active and are younger than 69 years of age. ? You are older than 69 years of age and your health care provider tells you that you are at risk for this type of infection. ? Your sexual activity has changed since you were last screened, and you are at increased risk for chlamydia or gonorrhea. Ask your  health care provider if you are at risk.  Ask your health care provider about whether you are at high risk for HIV. Your health care provider may recommend a prescription medicine to help prevent HIV infection. If you choose to take medicine to prevent HIV, you should first get tested for HIV. You should then be tested every 3 months for as long as you are taking the medicine. Follow these instructions at home: Lifestyle  Do not use any products that contain nicotine or tobacco, such as cigarettes, e-cigarettes, and chewing tobacco. If you need help quitting, ask your health care provider.  Do not use street drugs.  Do not share needles.  Ask your health care provider for help if you need support or information about quitting drugs. Alcohol use  Do not drink alcohol if your health care provider tells you not to drink.  If you drink alcohol: ? Limit how much you have to 0-2 drinks a day. ? Be aware of how much alcohol is in your drink. In the [place] one drink equals one 12 oz bottle of beer (355 mL), one 5 oz glass of wine (148 mL), or one 1 oz glass of hard liquor (44 mL). General instructions  Schedule regular health, dental, and eye exams.  Stay current with your vaccines.  Tell your health care provider if: ? You often feel depressed. ? You have ever been abused or do not feel safe at home. Summary  Adopting a healthy lifestyle and getting preventive care are important in promoting health and wellness.  Follow your health care provider's instructions about healthy diet, exercising, and getting tested or screened for diseases.  Follow your health care provider's instructions on monitoring your cholesterol and blood pressure. This information is not intended to replace advice given to you by your health care provider. Make sure you discuss any questions you have with your health care provider. Document Released: 01/02/2008 Document Revised: 06/29/2018 Document Reviewed:  06/29/2018 Elsevier Patient Education  2020 [employer].

## 2019-05-19 NOTE — Progress Notes (Signed)
Office Note 05/19/2019  CC:  Chief Complaint  Patient presents with  . Annual Exam    pt is fasting    HPI:  Gavin [name] is a 68 y.o. White male who is here for annual health maintenance exam.  Constipation lately, + external hemorrhoids cause intermittent mild pain, treats with otc tucks. High fiber diet started, working on hydration.  BM's dark brown and formed/sticky. No melena or BRBPR.  Intermittent nasal allergy sx's + PND. Daily non-sedating antihistamine prn, helps sometimes and other times not.  Intermittent feeling of ears stopping up.  Using saline nasal spray and flonase.  About 12 yr hx of burning sensation in distal 1/2 of both feet.  Constant, mild, just bothersome.  Past Medical History:  Diagnosis Date  . Allergic rhinitis   . 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 CAD (coronary artery disease)    CABG x 4 2004 [facility]).  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  . Cataract    Bilat: has lens implants bilat.  . Clotting disorder ([place]) 2004  . Colon polyp 09/19/2010   adenomas, recall 3 yrs.  . Depression   . Family history of colon cancer    Brother: in his 68s.  . Hemorrhoids   . History of fusion of cervical spine   . History of left lateral epicondylitis   . History of retinal detachment    OU  . Migraines   . Myocardial infarct ([place])    2004, when pt's stent clotted.  He got CABG at [facility] at that time.  . Prediabetes    05/2018-->HbA1c 5.8% (unchanged compared to 2 yrs prior).  . Pseudophakia   . RBBB     Past Surgical History:  Procedure Laterality Date  . CARDIOVASCULAR STRESS TEST  2013   No ischemia.  EF 70%.  Marland Kitchen CATARACT EXTRACTION, BILATERAL     with lens implants  . CERVICAL FUSION  2006   C5/6  . CHOLECYSTECTOMY  2004  . COLONOSCOPY  09/19/2010; 11/02/13; 04/28/18   2012: repeat 3 yrs, benign adanomas.  2015 same.  2019 same.  Recall 3 yrs.  . CORONARY ARTERY BYPASS GRAFT  2004    CABGx4  . CORONARY STENT PLACEMENT     BMS in 1990s.  Marland Kitchen POLYPECTOMY    . RETINAL DETACHMENT SURGERY N/A    2011 & 2013 both eyes affected  . TONSILLECTOMY  1956    Family History  Problem Relation Age of Onset  . Arthritis Mother   . Diabetes Mother   . Osteoporosis Mother   . Depression Father   . Heart attack Father   . Heart disease Father   . Hyperlipidemia Father   . Hypertension Father   . Stroke Father   . Aortic aneurysm Father   . Cancer Brother   . Heart disease Brother   . Hyperlipidemia Brother   . Hypertension Brother   . Colon cancer Brother 34  . Colon polyps Brother   . Diabetes Maternal Grandmother   . Early death Paternal Grandfather   . Colon cancer Brother   . Esophageal cancer Neg Hx   . Rectal cancer Neg Hx   . Stomach cancer Neg Hx     Social History   Socioeconomic History  . Marital status: Married    Spouse name: Not on file  . Number of children: Not on file  . Years of education: Not on file  .  Highest education level: Not on file  Occupational History  . Not on file  Social Needs  . Financial resource strain: Not on file  . Food insecurity    Worry: Not on file    Inability: Not on file  . Transportation needs    Medical: Not on file    Non-medical: Not on file  Tobacco Use  . Smoking status: Never Smoker  . Smokeless tobacco: Never Used  Substance and Sexual Activity  . Alcohol use: Yes    Alcohol/week: 5.0 standard drinks    Types: 5 Glasses of wine per week    Comment: 1 glass a wine a day   . Drug use: Never  . Sexual activity: Yes  Lifestyle  . Physical activity    Days per week: Not on file    Minutes per session: Not on file  . Stress: Not on file  Relationships  . Social Herbalist on phone: Not on file    Gets together: Not on file    Attends religious service: Not on file    Active member of club or organization: Not on file    Attends meetings of clubs or organizations: Not on file     Relationship status: Not on file  . Intimate partner violence    Fear of current or ex partner: Not on file    Emotionally abused: Not on file    Physically abused: Not on file    Forced sexual activity: Not on file  Other Topics Concern  . Not on file  Social History Narrative   Married, 2 grown children.  No grandchildren.   Masters Degree: Marketing executive.   [name]; retired Chief Financial Officer.   Alc: 2 drinks per week.   No tob.    Outpatient Medications Prior to Visit  Medication Sig Dispense Refill  . aspirin EC 81 MG tablet Take 81 mg by mouth daily.    Marland Kitchen atorvastatin (LIPITOR) 40 MG tablet TAKE 1 TABLET BY MOUTH EVERY DAY 90 tablet 0  . Cholecalciferol (VITAMIN D PO) Take 1 capsule by mouth daily.    . citalopram (CELEXA) 20 MG tablet TAKE 1 TABLET BY MOUTH EVERY DAY 90 tablet 0  . Coenzyme Q10 (COQ-10) 200 MG CAPS Take 1 capsule by mouth daily.    . fluticasone (FLONASE) 50 MCG/ACT nasal spray Place 2 sprays into both nostrils daily. (Patient not taking: Reported on 05/19/2019) 16 g 3   No facility-administered medications prior to visit.     Allergies  Allergen Reactions  . Penicillins Anaphylaxis   ROS Review of Systems  Constitutional: Negative for appetite change, chills, fatigue and fever.  HENT: Negative for congestion, dental problem, ear pain and sore throat.   Eyes: Negative for discharge, redness and visual disturbance.  Respiratory: Negative for cough, chest tightness, shortness of breath and wheezing.   Cardiovascular: Negative for chest pain, palpitations and leg swelling.  Gastrointestinal: Positive for constipation and rectal pain. Negative for abdominal pain, blood in stool, diarrhea, nausea and vomiting.  Genitourinary: Negative for difficulty urinating, dysuria, flank pain, frequency, hematuria and urgency.  Musculoskeletal: Negative for arthralgias, back pain, joint swelling, myalgias and neck stiffness.  Skin: Negative for pallor and rash.  Neurological: Negative  for dizziness, speech difficulty, weakness and headaches.  Hematological: Negative for adenopathy. Does not bruise/bleed easily.  Psychiatric/Behavioral: Negative for confusion and sleep disturbance. The patient is not nervous/anxious.     PE; Blood pressure 117/74, pulse 62, temperature 97.6 F (36.4  C), temperature source Temporal, resp. rate 16, height 6' 0.5" (1.842 m), weight 226 lb 3.2 oz (102.6 kg), SpO2 96 %. Body mass index is 30.26 kg/m.  Gen: Alert, well appearing.  Patient is oriented to person, place, time, and situation. AFFECT: pleasant, lucid thought and speech. ENT: Ears: EACs clear, normal epithelium.  TMs with good light reflex and landmarks bilaterally.  Eyes: no injection, icteris, swelling, or exudate.  EOMI, PERRLA. Nose: no drainage or turbinate edema/swelling.  No injection or focal lesion.  Mouth: lips without lesion/swelling.  Oral mucosa pink and moist.  Dentition intact and without obvious caries or gingival swelling.  Oropharynx without erythema, exudate, or swelling.  Neck: supple/nontender.  No LAD, mass, or TM.  Carotid pulses 2+ bilaterally, without bruits. CV: RRR, no m/r/g.   LUNGS: CTA bilat, nonlabored resps, good aeration in all lung fields. ABD: soft, NT, ND, BS normal.  No hepatospenomegaly or mass.  No bruits. EXT: no clubbing, cyanosis, or edema. Feet: normal sensation.  No deformity.  DP and PT pulses 2+ bilat. Feet warm/pink. Musculoskeletal: no joint swelling, erythema, warmth, or tenderness.  ROM of all joints intact. Skin - no sores or suspicious lesions or rashes or color changes Rectal exam: negative without mass, lesions or tenderness, PROSTATE EXAM: smooth and symmetric without nodules or tenderness.   Pertinent labs:  No results found for: TSH Lab Results  Component Value Date   WBC 6.4 05/20/2018   HGB 15.5 05/20/2018   HCT 45.2 05/20/2018   MCV 85.3 05/20/2018   PLT 234 05/20/2018   Lab Results  Component Value Date    CREATININE 1.25 05/20/2018   BUN 18 05/20/2018   NA 140 05/20/2018   K 4.6 05/20/2018   CL 104 05/20/2018   CO2 26 05/20/2018   Lab Results  Component Value Date   ALT 29 02/22/2018   AST 32 02/22/2018   ALKPHOS 77 02/22/2018   BILITOT 1.3 (H) 02/22/2018   Lab Results  Component Value Date   CHOL 114 02/22/2018   Lab Results  Component Value Date   HDL 47 02/22/2018   Lab Results  Component Value Date   LDLCALC 44 02/22/2018   Lab Results  Component Value Date   TRIG 113 02/22/2018   Lab Results  Component Value Date   CHOLHDL 2.4 02/22/2018   Lab Results  Component Value Date   PSA 1.18 05/20/2018   Lab Results  Component Value Date   HGBA1C 5.8 (H) 05/20/2018    ASSESSMENT AND PLAN:   1) Constipation, with intermittent ext hemorrhoid pain. Recommended miralax otc prn and rx'd anusol HC 2.5% cream to apply bid prn.  2) Allergic rhinitis: some intermittent "flares " that meds don't help much. Reassurance was all that was needed today.  3) Bilat feet paresthesias c/w peripheral neuropathy.  Fasting glucose and Hba1c today (hx prediabetes). This has actually improved over the years since starting 2012.  No additional w/u at this time.  4) Health maintenance exam: Reviewed age and gender appropriate health maintenance issues (prudent diet, regular exercise, health risks of tobacco and excessive alcohol, use of seatbelts, fire alarms in home, use of sunscreen).  Also reviewed age and gender appropriate health screening as well as vaccine recommendations. Vaccines: All UTD including flu vaccine and shingrix. Labs: HP labs, HbA1c (prediabetes), PSA. Prostate ca screening: DRE normal, PSA. Colon ca screening: recall 2022-2024 (q3-5 yr schedule).  An After Visit Summary was printed and given to the patient.  FOLLOW UP:  Return in about 6 months (around 11/17/2019) for routine chronic illness f/u.  Signed:  Crissie Sickles, MD           05/19/2019

## 2019-05-20 LAB — CBC WITH DIFFERENTIAL/PLATELET
Absolute Monocytes: 763 cells/uL (ref 200–950)
Basophils Absolute: 29 cells/uL (ref 0–200)
Basophils Relative: 0.4 %
Eosinophils Absolute: 158 cells/uL (ref 15–500)
Eosinophils Relative: 2.2 %
HCT: 45.7 % (ref 38.5–50.0)
Hemoglobin: 15.8 g/dL (ref 13.2–17.1)
Lymphs Abs: 1750 cells/uL (ref 850–3900)
MCH: 29.9 pg (ref 27.0–33.0)
MCHC: 34.6 g/dL (ref 32.0–36.0)
MCV: 86.4 fL (ref 80.0–100.0)
MPV: 10.7 fL (ref 7.5–12.5)
Monocytes Relative: 10.6 %
Neutro Abs: 4500 cells/uL (ref 1500–7800)
Neutrophils Relative %: 62.5 %
Platelets: 248 10*3/uL (ref 140–400)
RBC: 5.29 10*6/uL (ref 4.20–5.80)
RDW: 12.6 % (ref 11.0–15.0)
Total Lymphocyte: 24.3 %
WBC: 7.2 10*3/uL (ref 3.8–10.8)

## 2019-05-20 LAB — COMPREHENSIVE METABOLIC PANEL
AG Ratio: 1.9 (calc) (ref 1.0–2.5)
ALT: 23 U/L (ref 9–46)
AST: 22 U/L (ref 10–35)
Albumin: 4.1 g/dL (ref 3.6–5.1)
Alkaline phosphatase (APISO): 73 U/L (ref 35–144)
BUN: 19 mg/dL (ref 7–25)
CO2: 24 mmol/L (ref 20–32)
Calcium: 9.3 mg/dL (ref 8.6–10.3)
Chloride: 106 mmol/L (ref 98–110)
Creat: 1.08 mg/dL (ref 0.70–1.25)
Globulin: 2.2 g/dL (calc) (ref 1.9–3.7)
Glucose, Bld: 108 mg/dL — ABNORMAL HIGH (ref 65–99)
Potassium: 4.6 mmol/L (ref 3.5–5.3)
Sodium: 140 mmol/L (ref 135–146)
Total Bilirubin: 1.2 mg/dL (ref 0.2–1.2)
Total Protein: 6.3 g/dL (ref 6.1–8.1)

## 2019-05-20 LAB — TSH: TSH: 2.14 mIU/L (ref 0.40–4.50)

## 2019-05-20 LAB — LIPID PANEL
Cholesterol: 107 mg/dL (ref <200)
HDL: 47 mg/dL (ref >=40)
LDL Cholesterol (Calc): 38 mg/dL (calc)
Non-HDL Cholesterol (Calc): 60 mg/dL (calc) (ref <130)
Total CHOL/HDL Ratio: 2.3 (calc) (ref <5.0)
Triglycerides: 132 mg/dL (ref <150)

## 2019-05-20 LAB — HEMOGLOBIN A1C
Hgb A1c MFr Bld: 5.9 % of total Hgb — ABNORMAL HIGH (ref <5.7)
Mean Plasma Glucose: 123 (calc)
eAG (mmol/L): 6.8 (calc)

## 2019-05-21 ENCOUNTER — Encounter: Payer: Self-pay | Admitting: Family Medicine

## 2019-06-05 ENCOUNTER — Encounter: Payer: Self-pay | Admitting: Family Medicine

## 2019-08-27 ENCOUNTER — Other Ambulatory Visit: Payer: Self-pay | Admitting: Family Medicine

## 2019-08-28 ENCOUNTER — Other Ambulatory Visit: Payer: Self-pay | Admitting: Family Medicine

## 2019-08-28 MED ORDER — CITALOPRAM HYDROBROMIDE 20 MG PO TABS: 20.0000 mg | ORAL_TABLET | Freq: Every day | ORAL | 0 refills | Status: DC

## 2019-08-28 MED ORDER — ATORVASTATIN CALCIUM 40 MG PO TABS: 40.0000 mg | ORAL_TABLET | Freq: Every day | ORAL | 0 refills | Status: DC

## 2019-09-12 ENCOUNTER — Encounter: Payer: Self-pay | Admitting: Family Medicine

## 2019-10-02 ENCOUNTER — Encounter: Payer: Self-pay | Admitting: Family Medicine

## 2019-11-22 ENCOUNTER — Other Ambulatory Visit: Payer: Self-pay | Admitting: Family Medicine

## 2019-11-22 NOTE — Telephone Encounter (Signed)
Patient due for OV.  LMOM for pt to CB to schedule appointment.  30 day supply of citalopram and atorvastatin sent to pharmacy.

## 2019-11-28 ENCOUNTER — Telehealth: Payer: Self-pay

## 2019-11-28 ENCOUNTER — Encounter: Payer: Self-pay | Admitting: Family Medicine

## 2019-11-28 ENCOUNTER — Ambulatory Visit (INDEPENDENT_AMBULATORY_CARE_PROVIDER_SITE_OTHER): Payer: Medicare HMO | Admitting: Family Medicine

## 2019-11-28 ENCOUNTER — Other Ambulatory Visit: Payer: Self-pay

## 2019-11-28 VITALS — BP 125/78 | HR 92 | Temp 98.2°F | Resp 16 | Ht 72.5 in | Wt 221.5 lb

## 2019-11-28 DIAGNOSIS — W57XXXA Bitten or stung by nonvenomous insect and other nonvenomous arthropods, initial encounter: Secondary | ICD-10-CM | POA: Diagnosis not present

## 2019-11-28 DIAGNOSIS — L989 Disorder of the skin and subcutaneous tissue, unspecified: Secondary | ICD-10-CM | POA: Diagnosis not present

## 2019-11-28 DIAGNOSIS — S40861A Insect bite (nonvenomous) of right upper arm, initial encounter: Secondary | ICD-10-CM | POA: Diagnosis not present

## 2019-11-28 MED ORDER — DOXYCYCLINE HYCLATE 100 MG PO TABS: 100.0000 mg | ORAL_TABLET | Freq: Two times a day (BID) | ORAL | 0 refills | Status: DC

## 2019-11-28 MED ORDER — MUPIROCIN 2 % EX OINT: 1.0000 "application " | TOPICAL_OINTMENT | Freq: Two times a day (BID) | CUTANEOUS | 0 refills | Status: DC

## 2019-11-28 NOTE — Telephone Encounter (Signed)
Patient can be seen by Dr.Kuneff @ 1 or 1:30, in office.

## 2019-11-28 NOTE — Progress Notes (Signed)
This visit occurred during the SARS-CoV-2 public health emergency.  Safety protocols were in place, including screening questions prior to the visit, additional usage of staff PPE, and extensive cleaning of exam room while observing appropriate contact time as indicated for disinfecting solutions.    Gavin [name] , 1950-12-25, 69 y.o., male MRN: [id number] Patient Care Team    Relationship Specialty Notifications Start End  McGowen, Adrian Blackwater, MD PCP - General Family Medicine  02/14/18   Lorretta Harp, MD Consulting Physician Cardiology  03/02/18   Jerene Bears, MD Consulting Physician Gastroenterology  05/11/18     Chief Complaint  Patient presents with  . Insect Bite    One bite on R elbow and two bites on L elbow. Using antibiotic ointment. Noticed bites last night. No fever, no rash, and no joint pain      Subjective: Pt presents for an OV with complaints of x4 insects bites of his upper extremities. He denies fever, chills, headache or myalgia. He states he has never had a reaction to an insect bite such as these bites. He assumes when he was trimming the yard under the tree a spider bit him, but he is uncertain. He denies rash other than surrounding bites. The bites formed a blister- which he popped on left arm.  He takes a Claritin daily. The bites do not itch much, but are uncomfortable to touch.   Depression screen [facility] 2/9 05/02/2019 11/18/2018 05/20/2018  Decreased Interest 0 0 0  Down, Depressed, Hopeless 0 0 0  PHQ - 2 Score 0 0 0  Altered sleeping - 0 -  Tired, decreased energy - 0 -  Change in appetite - 1 -  Feeling bad or failure about yourself  - 0 -  Trouble concentrating - 0 -  Moving slowly or fidgety/restless - 0 -  Suicidal thoughts - 0 -  PHQ-9 Score - 1 -  Difficult doing work/chores - Not difficult at all -    Allergies  Allergen Reactions  . Penicillins Anaphylaxis   Social History   Social History Narrative   Married, 2 grown children.   No grandchildren.   Masters Degree: Marketing executive.   [name]; retired Chief Financial Officer.   Alc: 2 drinks per week.   No tob.   Past Medical History:  Diagnosis Date  . Allergic rhinitis   . 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 CAD (coronary artery disease)    CABG x 4 2004 [facility]).  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  . Cataract    Bilat: has lens implants bilat.  . Clotting disorder ([place]) 2004  . Colon polyp 09/19/2010   adenomas, recall 3 yrs.  . Depression   . Family history of colon cancer    Brother: in his 31s.  . Hemorrhoids   . History of fusion of cervical spine   . History of left lateral epicondylitis   . History of retinal detachment    OU  . Migraines   . Myocardial infarct ([place])    2004, when pt's stent clotted.  He got CABG at [facility] at that time.  . Prediabetes    04/2019-->HbA1c 5.9% (unchanged compared to 2/3 yrs prior).  . Pseudophakia   . RBBB    Past Surgical History:  Procedure Laterality Date  . CARDIOVASCULAR STRESS TEST  2013   No ischemia.  EF 70%.  Marland Kitchen CATARACT EXTRACTION, BILATERAL  with lens implants  . CERVICAL FUSION  2006   C5/6  . CHOLECYSTECTOMY  2004  . COLONOSCOPY  09/19/2010; 11/02/13; 04/28/18   2012: repeat 3 yrs, benign adanomas.  2015 same.  2019 same.  Recall 3 yrs.  . CORONARY ARTERY BYPASS GRAFT  2004   CABGx4  . CORONARY STENT PLACEMENT     BMS in 1990s.  Marland Kitchen POLYPECTOMY    . RETINAL DETACHMENT SURGERY N/A    2011 & 2013 both eyes affected  . TONSILLECTOMY  1956   Family History  Problem Relation Age of Onset  . Arthritis Mother   . Diabetes Mother   . Osteoporosis Mother   . Depression Father   . Heart attack Father   . Heart disease Father   . Hyperlipidemia Father   . Hypertension Father   . Stroke Father   . Aortic aneurysm Father   . Cancer Brother   . Heart disease Brother   . Hyperlipidemia Brother   . Hypertension Brother   . Colon cancer Brother 47  . Colon polyps Brother    . Diabetes Maternal Grandmother   . Early death Paternal Grandfather   . Colon cancer Brother   . Esophageal cancer Neg Hx   . Rectal cancer Neg Hx   . Stomach cancer Neg Hx    Allergies as of 11/28/2019      Reactions   Penicillins Anaphylaxis      Medication List       Accurate as of Nov 28, 2019  1:28 PM. If you have any questions, ask your nurse or doctor.        aspirin EC 81 MG tablet Take 81 mg by mouth daily.   atorvastatin 40 MG tablet Commonly known as: LIPITOR TAKE 1 TABLET BY MOUTH EVERY DAY   citalopram 20 MG tablet Commonly known as: CELEXA TAKE 1 TABLET BY MOUTH EVERY DAY   CoQ-10 200 MG Caps Take 1 capsule by mouth daily.   hydrocortisone 2.5 % rectal cream Commonly known as: Anusol-HC Place 1 application rectally 2 (two) times daily.   VITAMIN D PO Take 1 capsule by mouth daily.       All past medical history, surgical history, allergies, family history, immunizations andmedications were updated in the EMR today and reviewed under the history and medication portions of their EMR.     ROS: Negative, with the exception of above mentioned in HPI   Objective:  BP 125/78 (BP Location: Left Arm, Patient Position: Sitting, Cuff Size: Normal)   Pulse 92   Temp 98.2 F (36.8 C) (Temporal)   Resp 16   Ht 6' 0.5" (1.842 m)   Wt 221 lb 8 oz (100.5 kg)   SpO2 96%   BMI 29.63 kg/m  Body mass index is 29.63 kg/m. Gen: Afebrile. No acute distress. Nontoxic in appearance, well developed, well nourished.  HENT: AT. [place].  Eyes:Pupils Equal Round Reactive to light, Extraocular movements intact,  Conjunctiva without redness, discharge or icterus. Neck/lymp/endocrine: Supple,no lymphadenopathy Skin: no rashes, purpura or petechiae. x1 insect bite left arm with localized erythema and ruptured central blister, no drainage. Mild TTP. Right upper arm x3 smaller bites with mild local reaction - no obvious blisters. Neuro:Alert. Oriented x3   No exam data  present No results found. No results found for this or any previous visit (from the past 24 hour(s)).  Assessment/Plan: Gavin [name] is a 69 y.o. male present for OV for  Skin lesion ~63mm left hand  hyperpigmented lesion present. Pt reports it is new and would like referral to dermatology today. Possible Precancerous lesion suspected - Ambulatory referral to Dermatology  Insect bite of right upper arm, initial encounter x4 insect bites left arm and right arm. Do not appear infected today. Discussed topical antihistamine, such as benadryl gel.  Bactroban BID until healed.  Doxy script provided/printed. Pt to use only if redness, swelling, fever or pus-like drainage occurs. He understands instructions today.    Reviewed expectations re: course of current medical issues.  Discussed self-management of symptoms.  Outlined signs and symptoms indicating need for more acute intervention.  Patient verbalized understanding and all questions were answered.  Patient received an After-Visit Summary.    No orders of the defined types were placed in this encounter.  No orders of the defined types were placed in this encounter.  Referral Orders  No referral(s) requested today     Note is dictated utilizing voice recognition software. Although note has been proof read prior to signing, occasional typographical errors still can be missed. If any questions arise, please do not hesitate to call for verification.   electronically signed by:  Howard Pouch, DO  [place]

## 2019-11-28 NOTE — Telephone Encounter (Signed)
Patient has been scheduled for 1:30 appt

## 2019-11-28 NOTE — Telephone Encounter (Signed)
Patient called in stating that heed may have possible spider bite/bump has sereval and one has burst was leaking brown ooze and the others feel tight and almost ready to burst wanting to see if he can be seen     Please call and advise

## 2019-11-28 NOTE — Patient Instructions (Signed)
Start antihistamine gel over the counter (bendaryl) Use antibiotic ointment/cream twice a day until completely healed.   Printed script for an antibiotic ONLY if redness expands, swelling, fever or drainage (pus-like).    Spider Bite Spider bites are not common. Most spider bites do not cause serious problems. There are only a few types of spider bites that can cause serious health problems. What are the causes? This condition is caused when you make contact with a spider in a way that traps the spider against your skin. What are the signs or symptoms? Some spider bites may cause symptoms within 1 hour after the bite. For other spider bites, it may take 1-2 days for symptoms to appear. Symptoms include:  A raised area that is red.  Redness and swelling around the area of the bite.  Pain in the area of the bite. A few types of spiders, such as the black widow spider or the brown recluse spider, can inject poison (venom) into a bite wound. This causes more serious symptoms. Symptoms of these bites vary, and may include:  Muscle cramps.  Feeling sick to your stomach (nauseous).  Throwing up (vomiting).  Pain in your belly (abdomen).  A fever.  A skin sore (lesion) that spreads. This can break into an open wound (skin ulcer).  Feeling light-headed or dizzy. How is this treated? Many spider bites do not need treatment. If needed, treatment may include:  Icing and keeping the bite area raised (elevated).  Taking or applying over-the-counter or prescription medicines to help with symptoms such as pain and itching.  Having a tetanus shot.  Taking antibiotic medicine. Follow these instructions at home: Medicines  Take or apply over-the-counter and prescription medicines only as told by your doctor.  If you were prescribed an antibiotic medicine, take it as told by your doctor. Do not stop using it even if you start to feel better. Managing pain and swelling   If told, put  ice on the bite area. ? Put ice in a plastic bag. ? Place a towel between your skin and the bag. ? Leave the ice on for 20 minutes, 2-3 times a day.  Raise the bite area above the level of your heart while you are sitting or lying down. General instructions   Do not scratch the bite area.  Keep the bite area clean and dry. Wash the bite area with soap and water each day as told by your doctor.  Keep all follow-up visits as told by your doctor. This is important. Contact a doctor if:  Your bite does not get better after 3 days.  Your bite turns black or purple.  Near the bite, you have more: ? Redness. ? Swelling. ? Pain. Get help right away if:  You get shortness of breath or chest pain.  You have fluid, blood, or pus coming from the bite area.  You have painful muscle cramps or sudden muscle tightening (spasms).  You have belly pain.  You feel sick to your stomach or you throw up.  You feel more tired or sleepy than normal. Summary  Spider bites are not common. When spider bites do happen, most do not cause serious health problems.  Take or apply all medicines only as told by your doctor.  Keep the bite area clean and dry. Wash the bite area with soap and water each day as told by your doctor.  Contact a doctor if you have more redness, swelling, or pain near the bite.  Get help right away if you get shortness of breath or chest pain. This information is not intended to replace advice given to you by your health care provider. Make sure you discuss any questions you have with your health care provider. Document Revised: 02/15/2018 Document Reviewed: 02/15/2018 Elsevier Patient Education  [place].

## 2019-11-29 DIAGNOSIS — R69 Illness, unspecified: Secondary | ICD-10-CM | POA: Diagnosis not present

## 2019-11-29 DIAGNOSIS — Z008 Encounter for other general examination: Secondary | ICD-10-CM | POA: Diagnosis not present

## 2019-11-29 DIAGNOSIS — K5900 Constipation, unspecified: Secondary | ICD-10-CM | POA: Diagnosis not present

## 2019-11-29 DIAGNOSIS — Z809 Family history of malignant neoplasm, unspecified: Secondary | ICD-10-CM | POA: Diagnosis not present

## 2019-11-29 DIAGNOSIS — Z818 Family history of other mental and behavioral disorders: Secondary | ICD-10-CM | POA: Diagnosis not present

## 2019-11-29 DIAGNOSIS — E663 Overweight: Secondary | ICD-10-CM | POA: Diagnosis not present

## 2019-11-29 DIAGNOSIS — E785 Hyperlipidemia, unspecified: Secondary | ICD-10-CM | POA: Diagnosis not present

## 2019-11-29 DIAGNOSIS — I2510 Atherosclerotic heart disease of native coronary artery without angina pectoris: Secondary | ICD-10-CM | POA: Diagnosis not present

## 2019-11-29 DIAGNOSIS — I252 Old myocardial infarction: Secondary | ICD-10-CM | POA: Diagnosis not present

## 2019-11-29 DIAGNOSIS — R030 Elevated blood-pressure reading, without diagnosis of hypertension: Secondary | ICD-10-CM | POA: Diagnosis not present

## 2019-11-29 DIAGNOSIS — Z7982 Long term (current) use of aspirin: Secondary | ICD-10-CM | POA: Diagnosis not present

## 2019-12-01 ENCOUNTER — Telehealth: Payer: Self-pay | Admitting: Cardiovascular Disease

## 2019-12-01 NOTE — Telephone Encounter (Signed)
New Message  Patient states that she had a nurse from [employer] [facility]) come out to do an annual screening and patient states that he was told by the nurse that on the first systolic beat she heard a weird sound that sounded like paper rubbing together or crinkling when listening to his heart with a stethoscope. Patient states that he is asymptomatic and is having no issues.  Please give patient a call back to discuss.

## 2019-12-01 NOTE — Telephone Encounter (Signed)
Called and spoke with pt. States that a nurse had come in from [place] and examined he and his wife yesterday for their insurance plan. States that while she was listening to his heart she was puzzled. Per pt she reported a strange noise on the systolic beat that sounded like crinkling paper.  The nurse described this as a significantly abnormal sound per pt and suggested he follow up with his cardiologist.  Pt denies any symptoms and states that he feels great. He does report having CABGx4 in the past.  Notified this message would be sent to [facility] for review. Pt made aware to contact us if he becomes symptomatic and to go to the ED with active chest pain. Verbalized understanding with no other questions at this time

## 2019-12-01 NOTE — Telephone Encounter (Signed)
Spoke to patient he stated he feels fine.No chest pain.No sob. Stated he is going out out town next week.Appointment scheduled with Dr.Berry 12/13/19 at 10:00 am.

## 2019-12-13 ENCOUNTER — Other Ambulatory Visit: Payer: Self-pay

## 2019-12-13 ENCOUNTER — Ambulatory Visit: Payer: Medicare HMO | Admitting: Cardiovascular Disease

## 2019-12-13 ENCOUNTER — Encounter: Payer: Self-pay | Admitting: Cardiovascular Disease

## 2019-12-13 DIAGNOSIS — I2510 Atherosclerotic heart disease of native coronary artery without angina pectoris: Secondary | ICD-10-CM | POA: Diagnosis not present

## 2019-12-13 DIAGNOSIS — E782 Mixed hyperlipidemia: Secondary | ICD-10-CM

## 2019-12-13 DIAGNOSIS — I4510 Unspecified right bundle-branch block: Secondary | ICD-10-CM | POA: Diagnosis not present

## 2019-12-13 NOTE — Progress Notes (Signed)
12/13/2019 [name]   April 14, 1951  [id number]  Primary Physician McGowen, Adrian Blackwater, MD Primary Cardiologist: Lorretta Harp MD [name], [place]  HPI:  Gavin [name] is a 69 y.o.  mildly overweight married Caucasian male father 2 children their 48s referred by Dr.McGowento be established in my practice for ongoing care because of known disease.He is a retired Solicitor.  I last saw him for a virtual telemedicine video visit 02/22/2019. His risk factors include treated hyperlipidemia as well as family history with a brother who had a myocardial infarction. He had a stent placed in [place] work on his LAD in 1994. He had coronary artery bypass grafting x4 in 2004 at [facility] after myocardial infarction. He had a stress test along the way most recently at [facility] in 2013 which was nonischemic with an EF of 70%.   .  Prior to COVID-19 and the hot weather he was bicycling fairly frequently without symptoms.  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  He currently is flying gliders.  Since I saw him virtually a year ago he continues to do well.  He is still active.  He denies chest pain or shortness of breath..   Current Meds  Medication Sig  . aspirin EC 81 MG tablet Take 81 mg by mouth daily.  Marland Kitchen atorvastatin (LIPITOR) 40 MG tablet TAKE 1 TABLET BY MOUTH EVERY DAY  . Cholecalciferol (VITAMIN D PO) Take 1 capsule by mouth daily.  . citalopram (CELEXA) 20 MG tablet TAKE 1 TABLET BY MOUTH EVERY DAY  . Coenzyme Q10 (COQ-10) 200 MG CAPS Take 1 capsule by mouth daily.  . hydrocortisone (ANUSOL-HC) 2.5 % rectal cream Place 1 application rectally 2 (two) times daily.     Allergies  Allergen Reactions  . Penicillins Anaphylaxis    Social History   Socioeconomic History  . Marital status: Married    Spouse name: Not on file  . Number of children: Not on file    . Years of education: Not on file  . Highest education level: Not on file  Occupational History  . Not on file  Tobacco Use  . Smoking status: Never Smoker  . Smokeless tobacco: Never Used  Substance and Sexual Activity  . Alcohol use: Yes    Alcohol/week: 5.0 standard drinks    Types: 5 Glasses of wine per week    Comment: 1 glass a wine a day   . Drug use: Never  . Sexual activity: Yes  Other Topics Concern  . Not on file  Social History Narrative   Married, 2 grown children.  No grandchildren.   Masters Degree: Marketing executive.   [name]; retired Chief Financial Officer.   Alc: 2 drinks per week.   No tob.   Social Determinants of Health   Financial Resource Strain:   . Difficulty of Paying Living Expenses:   Food Insecurity:   . Worried About Charity fundraiser in the Last Year:   . Arboriculturist in the Last Year:   Transportation Needs:   . Film/video editor (Medical):   Marland Kitchen Lack of Transportation (Non-Medical):   Physical Activity:   . Days of Exercise per Week:   . Minutes of Exercise per Session:   Stress:   . Feeling of Stress :   Social Connections:   . Frequency of Communication with Friends and Family:   .  Frequency of Social Gatherings with Friends and Family:   . Attends Religious Services:   . Active Member of Clubs or Organizations:   . Attends Archivist Meetings:   Marland Kitchen Marital Status:   Intimate Partner Violence:   . Fear of Current or Ex-Partner:   . Emotionally Abused:   Marland Kitchen Physically Abused:   . Sexually Abused:      Review of Systems: General: negative for chills, fever, night sweats or weight changes.  Cardiovascular: negative for chest pain, dyspnea on exertion, edema, orthopnea, palpitations, paroxysmal nocturnal dyspnea or shortness of breath Dermatological: negative for rash Respiratory: negative for cough or wheezing Urologic: negative for hematuria Abdominal: negative for nausea, vomiting, diarrhea, bright red blood per rectum, melena, or  hematemesis Neurologic: negative for visual changes, syncope, or dizziness All other systems reviewed and are otherwise negative except as noted above.    Blood pressure 102/62, pulse 63, height 6' 1.5" (1.867 m), weight 219 lb (99.3 kg).  General appearance: alert and no distress Neck: no adenopathy, no carotid bruit, no JVD, supple, symmetrical, trachea midline and thyroid not enlarged, symmetric, no tenderness/mass/nodules Lungs: clear to auscultation bilaterally Heart: regular rate and rhythm, S1, S2 normal, no murmur, click, rub or gallop Extremities: extremities normal, atraumatic, no cyanosis or edema Pulses: 2+ and symmetric Skin: Skin color, texture, turgor normal. No rashes or lesions Neurologic: Alert and oriented X 3, normal strength and tone. Normal symmetric reflexes. Normal coordination and gait  EKG sinus rhythm at 63 with right bundle branch block unchanged from prior EKGs.  Personally reviewed this EKG.  ASSESSMENT AND PLAN:   Hyperlipidemia History of hyperlipidemia on atorvastatin with lipid profile performed 05/19/2019 revealing total cholesterol 107, LDL 38 and HDL of 47.  Coronary artery disease History of CAD status post LAD stenting in [place] back in 1994, CABG x4 in [name] in 2004 with a negative Myoview stress test at [facility] in 2013 with normal ejection fraction.  He is asymptomatic and denies chest pain or shortness of breath.  Right bundle branch block Chronic      Lorretta Harp MD [facility], [facility] 12/13/2019 10:28 AM

## 2019-12-13 NOTE — Assessment & Plan Note (Signed)
History of CAD status post LAD stenting in [place] back in 1994, CABG x4 in [facility] in 2004 with a negative Myoview stress test at [facility] in 2013 with normal ejection fraction.  He is asymptomatic and denies chest pain or shortness of breath.

## 2019-12-13 NOTE — Patient Instructions (Signed)
Medication Instructions:  No changes *If you need a refill on your cardiac medications before your next appointment, please call your pharmacy*   Lab Work: None ordered If you have labs (blood work) drawn today and your tests are completely normal, you will receive your results only by: . MyChart Message (if you have MyChart) OR . A paper copy in the mail If you have any lab test that is abnormal or we need to change your treatment, we will call you to review the results.   Testing/Procedures: None ordered   Follow-Up: At [facility], you and your health needs are our priority.  As part of our continuing mission to provide you with exceptional heart care, we have created designated Provider Care Teams.  These Care Teams include your primary Cardiologist (physician) and Advanced Practice Providers (APPs -  Physician Assistants and Nurse Practitioners) who all work together to provide you with the care you need, when you need it.  We recommend signing up for the patient portal called "MyChart".  Sign up information is provided on this After Visit Summary.  MyChart is used to connect with patients for Virtual Visits (Telemedicine).  Patients are able to view lab/test results, encounter notes, upcoming appointments, etc.  Non-urgent messages can be sent to your provider as well.   To learn more about what you can do with MyChart, go to [email or web address].    Your next appointment:   12 month(s)  The format for your next appointment:   In Person  Provider:   You may see Dr. Berry or one of the following Advanced Practice Providers on your designated Care Team:    Luke Kilroy, PA-C  Callie Goodrich, PA-C  Jesse Cleaver, FNP   

## 2019-12-13 NOTE — Assessment & Plan Note (Signed)
History of hyperlipidemia on atorvastatin with lipid profile performed 05/19/2019 revealing total cholesterol 107, LDL 38 and HDL of 47.

## 2019-12-13 NOTE — Assessment & Plan Note (Signed)
Chronic. 

## 2019-12-16 ENCOUNTER — Other Ambulatory Visit: Payer: Self-pay | Admitting: Family Medicine

## 2019-12-19 NOTE — Telephone Encounter (Signed)
Patient scheduled 12/20/19- requesting 90 day supply.  Patient just picked up 30 day supply 3 days ago.  Waiting to send in case Dr needs to change rx.

## 2019-12-19 NOTE — Telephone Encounter (Signed)
Patient has appt 12/20/19.  Has enough to get through till appt.  Requesting 90 day supply.

## 2019-12-20 ENCOUNTER — Other Ambulatory Visit: Payer: Self-pay

## 2019-12-20 ENCOUNTER — Encounter: Payer: Self-pay | Admitting: Family Medicine

## 2019-12-20 ENCOUNTER — Ambulatory Visit (INDEPENDENT_AMBULATORY_CARE_PROVIDER_SITE_OTHER): Payer: Medicare HMO | Admitting: Family Medicine

## 2019-12-20 VITALS — BP 112/77 | HR 65 | Temp 98.3°F | Resp 16 | Ht 73.5 in | Wt 219.0 lb

## 2019-12-20 DIAGNOSIS — E7800 Pure hypercholesterolemia, unspecified: Secondary | ICD-10-CM | POA: Diagnosis not present

## 2019-12-20 DIAGNOSIS — F329 Major depressive disorder, single episode, unspecified: Secondary | ICD-10-CM

## 2019-12-20 DIAGNOSIS — R69 Illness, unspecified: Secondary | ICD-10-CM | POA: Diagnosis not present

## 2019-12-20 DIAGNOSIS — R7303 Prediabetes: Secondary | ICD-10-CM | POA: Diagnosis not present

## 2019-12-20 DIAGNOSIS — I2510 Atherosclerotic heart disease of native coronary artery without angina pectoris: Secondary | ICD-10-CM

## 2019-12-20 DIAGNOSIS — F419 Anxiety disorder, unspecified: Secondary | ICD-10-CM

## 2019-12-20 DIAGNOSIS — F32A Depression, unspecified: Secondary | ICD-10-CM

## 2019-12-20 MED ORDER — CITALOPRAM HYDROBROMIDE 10 MG PO TABS: 10.0000 mg | ORAL_TABLET | Freq: Every day | ORAL | 0 refills | Status: DC

## 2019-12-20 NOTE — Progress Notes (Addendum)
OFFICE VISIT  12/20/2019   CC:  Chief Complaint  Patient presents with  . Follow-up    RCI, pt is fasting   HPI:    Patient is a 69 y.o. Caucasian male who presents for 6 mo f/u depression, prediabetes, hx of CAD for which he has had CABG back in 2004.  A/P as of last visit: "1) Constipation, with intermittent ext hemorrhoid pain. Recommended miralax otc prn and rx'd anusol HC 2.5% cream to apply bid prn.  2) Allergic rhinitis: some intermittent "flares " that meds don't help much. Reassurance was all that was needed today.  3) Bilat feet paresthesias c/w peripheral neuropathy.  Fasting glucose and Hba1c today (hx prediabetes). This has actually improved over the years since starting 2012.  No additional w/u at this time.  4) Health maintenance exam: Reviewed age and gender appropriate health maintenance issues (prudent diet, regular exercise, health risks of tobacco and excessive alcohol, use of seatbelts, fire alarms in home, use of sunscreen).  Also reviewed age and gender appropriate health screening as well as vaccine recommendations. Vaccines: All UTD including flu vaccine and shingrix. Labs: HP labs, HbA1c (prediabetes), PSA. Prostate ca screening: DRE normal, PSA. Colon ca screening: recall 2022-2024 (q3-5 yr schedule)."    INTERIM HX: He had routine cardiologist f/u 12/13/19, all was well/stable, no changes were made.  Approx 2-3 wks ago he got cramps in L hand while driving to [place], these then roved to foot, arm, shoulder.  Drank some orange juice and it went away.  Hasn't bothered him since.  Has appt 02/2020 to get a couple of new skin lesions on L hand/arm.  Prediabetes: some lower carb and lower fat changes, still occ "cheats". One glass wine 2-3 times per week.  Biking some and walking some.  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  Mood and anxiety are stable.  Has tried coming off this cold turking in the past.  After a few weeks he began to feel sx's creaping  back so he restarted the med. Wants to try weening off under my supervision.  ROS: no fevers, no CP, no SOB, no wheezing, no cough, no dizziness, no HAs, no rashes, no melena/hematochezia.  No polyuria or polydipsia.  No myalgias or arthralgias.  No focal weakness, paresthesias, or tremors.  No acute vision or hearing abnormalities. No n/v/d or abd pain.  No palpitations.     Past Medical History:  Diagnosis Date  . Allergic rhinitis   . 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 CAD (coronary artery disease)    CABG x 4 2004 [facility]).  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  . Cataract    Bilat: has lens implants bilat.  . Clotting disorder ([place]) 2004  . Colon polyp 09/19/2010   adenomas, recall 3 yrs.  . Depression   . Family history of colon cancer    Brother: in his 48s.  . Hemorrhoids   . History of fusion of cervical spine   . History of left lateral epicondylitis   . History of retinal detachment    OU  . Migraines   . Myocardial infarct ([place])    2004, when pt's stent clotted.  He got CABG at [facility] at that time.  . Prediabetes    04/2019-->HbA1c 5.9% (unchanged compared to 2/3 yrs prior).  . Pseudophakia   . RBBB     Past Surgical History:  Procedure Laterality Date  . CARDIOVASCULAR STRESS TEST  2013   No ischemia.  EF 70%.  Marland Kitchen CATARACT EXTRACTION, BILATERAL     with lens implants  . CERVICAL FUSION  2006   C5/6  . CHOLECYSTECTOMY  2004  . COLONOSCOPY  09/19/2010; 11/02/13; 04/28/18   2012: repeat 3 yrs, benign adanomas.  2015 same.  2019 same.  Recall 3 yrs.  . CORONARY ARTERY BYPASS GRAFT  2004   CABGx4  . CORONARY STENT PLACEMENT     BMS in 1990s.  Marland Kitchen POLYPECTOMY    . RETINAL DETACHMENT SURGERY N/A    2011 & 2013 both eyes affected  . TONSILLECTOMY  1956    Outpatient Medications Prior to Visit  Medication Sig Dispense Refill  . aspirin EC 81 MG tablet Take 81 mg by mouth daily.    Marland Kitchen atorvastatin (LIPITOR) 40 MG tablet  TAKE 1 TABLET BY MOUTH EVERY DAY 30 tablet 0  . Cholecalciferol (VITAMIN D PO) Take 1 capsule by mouth daily.    . Coenzyme Q10 (COQ-10) 200 MG CAPS Take 1 capsule by mouth daily.    . citalopram (CELEXA) 20 MG tablet TAKE 1 TABLET BY MOUTH EVERY DAY 30 tablet 0  . hydrocortisone (ANUSOL-HC) 2.5 % rectal cream Place 1 application rectally 2 (two) times daily. (Patient not taking: Reported on 12/20/2019) 30 g 3   No facility-administered medications prior to visit.    Allergies  Allergen Reactions  . Penicillins Anaphylaxis    ROS As per HPI  PE: Vitals with BMI 12/20/2019 12/13/2019 11/28/2019  Height 6' 1.5" 6' 1.5" 6' 0.5"  Weight 219 lbs 219 lbs 221 lbs 8 oz  BMI 28.5 [id number]  Systolic [id number]  Diastolic 77 62 78  Pulse 65 63 92    Gen: Alert, well appearing.  Patient is oriented to person, place, time, and situation. AFFECT: pleasant, lucid thought and speech. .cvpulmm EXT: no clubbing or cyanosis.  no edema.    LABS:  Lab Results  Component Value Date   TSH 2.14 05/19/2019   Lab Results  Component Value Date   WBC 7.2 05/19/2019   HGB 15.8 05/19/2019   HCT 45.7 05/19/2019   MCV 86.4 05/19/2019   PLT 248 05/19/2019   Lab Results  Component Value Date   CREATININE 1.08 05/19/2019   BUN 19 05/19/2019   NA 140 05/19/2019   K 4.6 05/19/2019   CL 106 05/19/2019   CO2 24 05/19/2019   Lab Results  Component Value Date   ALT 23 05/19/2019   AST 22 05/19/2019   ALKPHOS 77 02/22/2018   BILITOT 1.2 05/19/2019   Lab Results  Component Value Date   CHOL 107 05/19/2019   Lab Results  Component Value Date   HDL 47 05/19/2019   Lab Results  Component Value Date   LDLCALC 38 05/19/2019   Lab Results  Component Value Date   TRIG 132 05/19/2019   Lab Results  Component Value Date   CHOLHDL 2.3 05/19/2019   Lab Results  Component Value Date   PSA 1.46 05/19/2019   PSA 1.18 05/20/2018   Lab Results  Component Value Date   HGBA1C 5.9 (H)  05/19/2019    IMPRESSION AND PLAN:  1) Prediabetes: working on TLCs. HbA1c and fasting glucose today.  2) HLD: last lipids at goal 04/2019. FLP and hepatic panel today.  3) Hx of anx/dep: we'll do ween off citalopram.  He's been on this med many years.  Dec to 10mg  cital dosing x 65mo. F/u 1  mo and if doing well then we'll dec to 5mg  qd.  4) CAD, hx of CABG: asymptomatic and doing very well. Keeping up with annual cardiology f/u.  An After Visit Summary was printed and given to the patient.  FOLLOW UP: Return in about 6 months (around 06/20/2020) for annual CPE (fasting); 1 mo f/u anx/dep/med ween.  Signed:  Crissie Sickles, MD           12/20/2019

## 2019-12-21 ENCOUNTER — Encounter: Payer: Self-pay | Admitting: Family Medicine

## 2019-12-21 LAB — HEMOGLOBIN A1C
Hgb A1c MFr Bld: 5.8 % of total Hgb — ABNORMAL HIGH (ref <5.7)
Mean Plasma Glucose: 120 (calc)
eAG (mmol/L): 6.6 (calc)

## 2019-12-21 LAB — COMPREHENSIVE METABOLIC PANEL
AG Ratio: 2 (calc) (ref 1.0–2.5)
ALT: 24 U/L (ref 9–46)
AST: 22 U/L (ref 10–35)
Albumin: 4 g/dL (ref 3.6–5.1)
Alkaline phosphatase (APISO): 73 U/L (ref 35–144)
BUN: 15 mg/dL (ref 7–25)
CO2: 23 mmol/L (ref 20–32)
Calcium: 9 mg/dL (ref 8.6–10.3)
Chloride: 106 mmol/L (ref 98–110)
Creat: 0.9 mg/dL (ref 0.70–1.25)
Globulin: 2 g/dL (calc) (ref 1.9–3.7)
Glucose, Bld: 100 mg/dL — ABNORMAL HIGH (ref 65–99)
Potassium: 4.5 mmol/L (ref 3.5–5.3)
Sodium: 140 mmol/L (ref 135–146)
Total Bilirubin: 0.9 mg/dL (ref 0.2–1.2)
Total Protein: 6 g/dL — ABNORMAL LOW (ref 6.1–8.1)

## 2019-12-21 LAB — LIPID PANEL
Cholesterol: 110 mg/dL (ref <200)
HDL: 44 mg/dL (ref >=40)
LDL Cholesterol (Calc): 45 mg/dL (calc)
Non-HDL Cholesterol (Calc): 66 mg/dL (calc) (ref <130)
Total CHOL/HDL Ratio: 2.5 (calc) (ref <5.0)
Triglycerides: 128 mg/dL (ref <150)

## 2019-12-25 NOTE — Addendum Note (Signed)
Addended by: Zebedee Iba on: 12/25/2019 07:55 AM   Modules accepted: Orders

## 2020-01-11 ENCOUNTER — Other Ambulatory Visit: Payer: Self-pay | Admitting: Family Medicine

## 2020-01-29 ENCOUNTER — Other Ambulatory Visit: Payer: Self-pay

## 2020-01-29 ENCOUNTER — Encounter: Payer: Self-pay | Admitting: Family Medicine

## 2020-01-29 ENCOUNTER — Ambulatory Visit (INDEPENDENT_AMBULATORY_CARE_PROVIDER_SITE_OTHER): Payer: Medicare HMO | Admitting: Family Medicine

## 2020-01-29 VITALS — BP 102/68 | HR 59 | Temp 97.7°F | Resp 17 | Ht 73.5 in | Wt 218.4 lb

## 2020-01-29 DIAGNOSIS — F419 Anxiety disorder, unspecified: Secondary | ICD-10-CM | POA: Diagnosis not present

## 2020-01-29 DIAGNOSIS — R69 Illness, unspecified: Secondary | ICD-10-CM | POA: Diagnosis not present

## 2020-01-29 DIAGNOSIS — R058 Other specified cough: Secondary | ICD-10-CM

## 2020-01-29 DIAGNOSIS — R05 Cough: Secondary | ICD-10-CM

## 2020-01-29 NOTE — Progress Notes (Signed)
OFFICE VISIT  01/29/2020   CC:  Chief Complaint  Patient presents with  . Anxiety    F/U on getting off med. Pt continues to have to clear his throat.   . Depression   HPI:    Patient is a 69 y.o. Caucasian male who presents for 6 wk f/u weaning off citalopram. A/P as of last visit: "1) Prediabetes: working on [employer] HbA1c and fasting glucose today.  2) HLD: last lipids at goal 04/2019. FLP and hepatic panel today.  3) Hx of anx/dep: we'll do ween off citalopram.  He's been on this med many years.  Dec to 10mg  cital dosing x 74mo. F/u 1 mo and if doing well then we'll dec to 5mg  qd.  4) CAD, hx of CABG: asymptomatic and doing very well. Keeping up with annual cardiology f/u."  INTERIM HX: Doing ok after a rough patch anxiety-wise. Off citalopram completely now. Does not want to restart anything at this time.  Coughing a lot--comes in "spurts", seems to be a common thing.  Clearing throat a lot. Worse after eating and when lying down to sleep hs. No SOB, hemoptysis, SOB, or wheezing or chest tightness. No CP. Has tried mucinex sometimes--unclear if helps. Also tries antihistamine at times. Rarely feels GER, occ takes tums but no anti-reflux meds.   Past Medical History:  Diagnosis Date  . Allergic rhinitis   . 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 CAD (coronary artery disease)    CABG x 4 2004 [facility]).  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  . Cataract    Bilat: has lens implants bilat.  . Clotting disorder ([place]) 2004  . Colon polyp 09/19/2010   adenomas, recall 3 yrs.  . Depression   . Family history of colon cancer    Brother: in his 59s.  . Hemorrhoids   . History of fusion of cervical spine   . History of left lateral epicondylitis   . History of retinal detachment    OU  . Migraines   . Myocardial infarct ([place])    2004, when pt's stent clotted.  He got CABG at [facility] at that time.  . Prediabetes    04/2019-->HbA1c  5.9% (unchanged compared to 2/3 yrs prior). 5.8% 11/2019  . Pseudophakia   . RBBB     Past Surgical History:  Procedure Laterality Date  . CARDIOVASCULAR STRESS TEST  2013   No ischemia.  EF 70%.  Marland Kitchen CATARACT EXTRACTION, BILATERAL     with lens implants  . CERVICAL FUSION  2006   C5/6  . CHOLECYSTECTOMY  2004  . COLONOSCOPY  09/19/2010; 11/02/13; 04/28/18   2012: repeat 3 yrs, benign adanomas.  2015 same.  2019 same.  Recall 3 yrs.  . CORONARY ARTERY BYPASS GRAFT  2004   CABGx4  . CORONARY STENT PLACEMENT     BMS in 1990s.  Marland Kitchen POLYPECTOMY    . RETINAL DETACHMENT SURGERY N/A    2011 & 2013 both eyes affected  . TONSILLECTOMY  1956    Outpatient Medications Prior to Visit  Medication Sig Dispense Refill  . aspirin EC 81 MG tablet Take 81 mg by mouth daily.    Marland Kitchen atorvastatin (LIPITOR) 40 MG tablet TAKE 1 TABLET BY MOUTH EVERY DAY 90 tablet 0  . Cholecalciferol (VITAMIN D PO) Take 1 capsule by mouth daily.    . Coenzyme Q10 (COQ-10) 200 MG CAPS Take 1 capsule by mouth daily.    Marland Kitchen  hydrocortisone (ANUSOL-HC) 2.5 % rectal cream Place 1 application rectally 2 (two) times daily. 30 g 3  . citalopram (CELEXA) 10 MG tablet TAKE 1 TABLET BY MOUTH EVERY DAY (Patient not taking: Reported on 01/29/2020) 90 tablet 1   No facility-administered medications prior to visit.    Allergies  Allergen Reactions  . Penicillins Anaphylaxis    ROS As per HPI  PE: Blood pressure 102/68, pulse (!) 59, temperature 97.7 F (36.5 C), temperature source Temporal, resp. rate 17, height 6' 1.5" (1.867 m), weight 218 lb 6 oz (99.1 kg), SpO2 97 %. Gen: Alert, well appearing.  Patient is oriented to person, place, time, and situation. AFFECT: pleasant, lucid thought and speech. TMA:UQJF: no injection, icteris, swelling, or exudate.  EOMI, PERRLA. Mouth: lips without lesion/swelling.  Oral mucosa pink and moist. Oropharynx without erythema, exudate, or swelling.  CV: RRR, no m/r/g.   LUNGS: CTA bilat,  nonlabored resps, good aeration in all lung fields.   LABS:  Lab Results  Component Value Date   CHOL 110 12/20/2019   HDL 44 12/20/2019   LDLCALC 45 12/20/2019   TRIG 128 12/20/2019   CHOLHDL 2.5 12/20/2019     Chemistry      Component Value Date/Time   NA 140 12/20/2019 1016   NA 140 12/01/2012 0000   K 4.5 12/20/2019 1016   CL 106 12/20/2019 1016   CO2 23 12/20/2019 1016   BUN 15 12/20/2019 1016   BUN 17 12/01/2012 0000   CREATININE 0.90 12/20/2019 1016   GLU 94 12/01/2012 0000      Component Value Date/Time   CALCIUM 9.0 12/20/2019 1016   ALKPHOS 77 02/22/2018 1112   AST 22 12/20/2019 1016   ALT 24 12/20/2019 1016   BILITOT 0.9 12/20/2019 1016   BILITOT 1.3 (H) 02/22/2018 1112     Lab Results  Component Value Date   HGBA1C 5.8 (H) 12/20/2019    IMPRESSION AND PLAN:  1) Anxiety: a rough wean off citalopram but he's made it through and wants to stay off any anxiety med at this time.  2) Upper airway cough syndrome. Elevate the head of your bed (ideally with 6 inch  bed blocks),  Smoking cessation, avoidance of late meals, excessive alcohol, and avoid fatty foods, chocolate, peppermint, colas, red wine, and acidic juices such as orange juice.  NO MINT OR MENTHOL PRODUCTS SO NO COUGH DROPS   USE SUGARLESS CANDY INSTEAD (Jolley ranchers or Stover's or Life Savers) or even ice chips will also do - the key is to swallow to prevent all throat clearing. NO OIL BASED VITAMINS - use powdered substitutes.  Trial of prevacid 20mg  otc qd x 2 wks recommended.  An After Visit Summary was printed and given to the patient.  FOLLOW UP: No follow-ups on file.  Signed:  Crissie Sickles, MD           01/29/2020

## 2020-02-07 ENCOUNTER — Encounter: Payer: Self-pay | Admitting: Family Medicine

## 2020-02-07 DIAGNOSIS — R053 Chronic cough: Secondary | ICD-10-CM

## 2020-02-07 NOTE — Telephone Encounter (Signed)
Patient has done recommendations but still not getting any relief. Please advise, thanks.

## 2020-02-07 NOTE — Telephone Encounter (Signed)
OK. I'll order chest x-ray and order referral to pulmonologist. No new meds, but I do recommend he continue taking prevacid b/c it often takes it LONGER to help his problem of chronic cough.

## 2020-02-21 ENCOUNTER — Other Ambulatory Visit: Payer: Self-pay

## 2020-02-21 ENCOUNTER — Encounter: Payer: Self-pay | Admitting: Physician Assistant

## 2020-02-21 ENCOUNTER — Ambulatory Visit: Payer: Medicare HMO | Admitting: Physician Assistant

## 2020-02-21 DIAGNOSIS — L820 Inflamed seborrheic keratosis: Secondary | ICD-10-CM

## 2020-02-21 DIAGNOSIS — L570 Actinic keratosis: Secondary | ICD-10-CM | POA: Diagnosis not present

## 2020-02-21 MED ORDER — FLUOROURACIL 5 % EX CREA: TOPICAL_CREAM | Freq: Every day | CUTANEOUS | 2 refills | Status: DC

## 2020-02-21 NOTE — Progress Notes (Signed)
   New Patient Visit  Subjective  Gavin [name] is a 69 y.o. male who presents for the following: New Patient (Initial Visit) (pt stated--Left hand/arm--discoloration darker, elevation. ---5-6 years). Spot on the dorsum of the left hand that has been there a couple years. It has a different texture. He also gets dry spots especially on the left forearm. He also gets easy bruising on his arms.  Objective  Well appearing patient in no apparent distress; mood and affect are within normal limits.  A full upper body examination was performed. All findings within normal limits unless otherwise noted below. No suspicious moles noted on back.  Objective  Left Forearm - Posterior, Left Hand - Posterior, Left Malar Cheek: Erythematous patches with gritty scale.  Objective  Left Hand - Posterior: Erythematous stuck-on, waxy papule or plaque.   Assessment & Plan  AK (actinic keratosis) (3) Left Hand - Posterior; Left Forearm - Posterior; Left Malar Cheek  He will plan to treat the left forearm and hand and left cheek with the fluorouracil cream in the fall and follow up 8 weeks after.  Destruction of lesion - Left Hand - Posterior Complexity: simple   Destruction method: cryotherapy   Informed consent: discussed and consent obtained   Timeout:  patient name, date of birth, surgical site, and procedure verified Lesion destroyed using liquid nitrogen: Yes   Outcome: patient tolerated procedure well with no complications    fluorouracil (EFUDEX) 5 % cream - Left Forearm - Posterior, Left Malar Cheek  Inflamed seborrheic keratosis Left Hand - Posterior  Destruction of lesion - Left Hand - Posterior Complexity: simple   Destruction method: cryotherapy   Informed consent: discussed and consent obtained   Timeout:  patient name, date of birth, surgical site, and procedure verified Lesion destroyed using liquid nitrogen: Yes   Outcome: patient tolerated procedure well with no complications

## 2020-02-22 ENCOUNTER — Telehealth: Payer: Self-pay

## 2020-02-22 NOTE — Telephone Encounter (Signed)
[facility] [place] Key: [id number] - PA Case ID: [id number] - Rx #: [id number] Need help? Call us at [phone] Outcome Approvedtoday Your request has been approved Drug Fluorouracil 5% cream Form [employer] Electronic PA Form (2017 NCPDP) Original Claim Info 81,448 PRECERT REQ BY MD [phone] REQUIRES PRIOR AUTHORIZATION

## 2020-02-22 NOTE — Telephone Encounter (Signed)
[facility] [place] Key: [id number] - PA Case ID: [id number] - Rx #: [id number] Need help? Call us at [phone] Status Sent to [employer] Drug Fluorouracil 5% cream Form [employer] Medicare Electronic PA Form (2017 NCPDP) Original Claim Info 12,224 PRECERT REQ BY MD [phone] REQUIRES PRIOR AUTHORIZATION   Patient aware if medication is more than 45$ we can change to [facility]

## 2020-02-29 ENCOUNTER — Other Ambulatory Visit: Payer: Self-pay

## 2020-02-29 ENCOUNTER — Encounter: Payer: Self-pay | Admitting: Physician Assistant

## 2020-02-29 DIAGNOSIS — L570 Actinic keratosis: Secondary | ICD-10-CM

## 2020-02-29 MED ORDER — FLUOROURACIL 5 % EX CREA: TOPICAL_CREAM | Freq: Every day | CUTANEOUS | 2 refills | Status: DC

## 2020-02-29 NOTE — Telephone Encounter (Signed)
Patient wanted it sent to [facility].

## 2020-03-01 ENCOUNTER — Telehealth: Payer: Self-pay | Admitting: Physician Assistant

## 2020-03-01 MED ORDER — TOLAK 4 % EX CREA
1.0000 | TOPICAL_CREAM | Freq: Every evening | CUTANEOUS | 0 refills | Status: DC
Start: 2020-03-01 — End: 2020-06-10

## 2020-03-01 NOTE — Telephone Encounter (Signed)
Wants to know about the Rx for fluororosil. Needs to change pharmacy he told you to one he says you'll know (NOT [facility] or [place]). (He doesn't know its name). He expects you to call him

## 2020-03-15 ENCOUNTER — Other Ambulatory Visit: Payer: Self-pay | Admitting: Family Medicine

## 2020-03-16 DIAGNOSIS — Z23 Encounter for immunization: Secondary | ICD-10-CM | POA: Diagnosis not present

## 2020-04-11 DIAGNOSIS — R69 Illness, unspecified: Secondary | ICD-10-CM | POA: Diagnosis not present

## 2020-04-12 DIAGNOSIS — R69 Illness, unspecified: Secondary | ICD-10-CM | POA: Diagnosis not present

## 2020-04-26 ENCOUNTER — Ambulatory Visit (INDEPENDENT_AMBULATORY_CARE_PROVIDER_SITE_OTHER): Payer: Medicare HMO | Admitting: Family Medicine

## 2020-04-26 ENCOUNTER — Encounter: Payer: Self-pay | Admitting: Family Medicine

## 2020-04-26 ENCOUNTER — Other Ambulatory Visit: Payer: Self-pay

## 2020-04-26 VITALS — BP 121/74 | HR 69 | Temp 97.8°F | Ht 73.5 in | Wt 220.0 lb

## 2020-04-26 DIAGNOSIS — M7989 Other specified soft tissue disorders: Secondary | ICD-10-CM

## 2020-04-26 DIAGNOSIS — N63 Unspecified lump in unspecified breast: Secondary | ICD-10-CM

## 2020-04-26 NOTE — Progress Notes (Signed)
OFFICE VISIT  04/26/2020  CC:  Chief Complaint  Patient presents with  . Breast Mass    pt c/o left breast mass approx 5 o'clock from nipple, that is tender to touch; noticed 2 days ago    HPI:    Patient is a 69 y.o. Caucasian male who presents for "sore spot, lump on nipple". About 2 d/a he noted a sore lump just lateral to L nipple.  No nipple d/c. Feels nothing abnl on R side.   Past Medical History:  Diagnosis Date  . Allergic rhinitis   . 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 CAD (coronary artery disease)    CABG x 4 2004 [facility]).  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  . Cataract    Bilat: has lens implants bilat.  . Clotting disorder ([place]) 2004  . Colon polyp 09/19/2010   adenomas, recall 3 yrs.  . Depression   . Family history of colon cancer    Brother: in his 75s.  . Hemorrhoids   . History of fusion of cervical spine   . History of left lateral epicondylitis   . History of retinal detachment    OU  . Migraines   . Myocardial infarct ([employer])    2004, when pt's stent clotted.  He got CABG at [facility] at that time.  . Prediabetes    04/2019-->HbA1c 5.9% (unchanged compared to 2/3 yrs prior). 5.8% 11/2019  . Pseudophakia   . RBBB     Past Surgical History:  Procedure Laterality Date  . CARDIOVASCULAR STRESS TEST  2013   No ischemia.  EF 70%.  Marland Kitchen CATARACT EXTRACTION, BILATERAL     with lens implants  . CERVICAL FUSION  2006   C5/6  . CHOLECYSTECTOMY  2004  . COLONOSCOPY  09/19/2010; 11/02/13; 04/28/18   2012: repeat 3 yrs, benign adanomas.  2015 same.  2019 same.  Recall 3 yrs.  . CORONARY ARTERY BYPASS GRAFT  2004   CABGx4  . CORONARY STENT PLACEMENT     BMS in 1990s.  Marland Kitchen POLYPECTOMY    . RETINAL DETACHMENT SURGERY N/A    2011 & 2013 both eyes affected  . TONSILLECTOMY  1956    Outpatient Medications Prior to Visit  Medication Sig Dispense Refill  . aspirin EC 81 MG tablet Take 81 mg by mouth daily.    Marland Kitchen  atorvastatin (LIPITOR) 40 MG tablet TAKE 1 TABLET BY MOUTH EVERY DAY 90 tablet 0  . Cholecalciferol (VITAMIN D PO) Take 1 capsule by mouth daily.    . Coenzyme Q10 (COQ-10) 200 MG CAPS Take 1 capsule by mouth daily.    . Fluorouracil (TOLAK) 4 % CREA Apply 1 application topically at bedtime. (Patient not taking: Reported on 04/26/2020) 40 g 0  . hydrocortisone (ANUSOL-HC) 2.5 % rectal cream Place 1 application rectally 2 (two) times daily. (Patient not taking: Reported on 04/26/2020) 30 g 3   No facility-administered medications prior to visit.    Allergies  Allergen Reactions  . Penicillins Anaphylaxis    ROS As per HPI  PE: Vitals with BMI 04/26/2020 01/29/2020 12/20/2019  Height 6' 1.5" 6' 1.5" 6' 1.5"  Weight 220 lbs 218 lbs 6 oz 219 lbs  BMI 28.63 04.54 09.8  Systolic 119 147 829  Diastolic 74 68 77  Pulse 69 59 65     Gen: Alert, well appearing.  Patient is oriented to person, place, time, and situation. AFFECT: pleasant, lucid  thought and speech. R breast area/chest wall/nipple normal to palpation, no tenderness or nodularity. L breast/chest wall with very small (about 2 cm), ropy-feeling palpable nodularity---indistinct and not really oval/round type of nodular feeling.  It is tender to palpation.  No fluctuance. Nipple is normal, w/out d/c.  LABS:    Chemistry      Component Value Date/Time   NA 140 12/20/2019 1016   NA 140 12/01/2012 0000   K 4.5 12/20/2019 1016   CL 106 12/20/2019 1016   CO2 23 12/20/2019 1016   BUN 15 12/20/2019 1016   BUN 17 12/01/2012 0000   CREATININE 0.90 12/20/2019 1016   GLU 94 12/01/2012 0000      Component Value Date/Time   CALCIUM 9.0 12/20/2019 1016   ALKPHOS 77 02/22/2018 1112   AST 22 12/20/2019 1016   ALT 24 12/20/2019 1016   BILITOT 0.9 12/20/2019 1016   BILITOT 1.3 (H) 02/22/2018 1112     Lab Results  Component Value Date   WBC 7.2 05/19/2019   HGB 15.8 05/19/2019   HCT 45.7 05/19/2019   MCV 86.4 05/19/2019   PLT 248  05/19/2019   Lab Results  Component Value Date   HGBA1C 5.8 (H) 12/20/2019    IMPRESSION AND PLAN:  L breast/chest wall indistinct, tender nodule just lateral to nipple. Unknown etiology but possibly small area of benign gynecomastia. Further eval with mammogram and ultrasound of L breast.  An After Visit Summary was printed and given to the patient.  FOLLOW UP: Return for to be determined based on imaging results.  Signed:  Crissie Sickles, MD           04/26/2020

## 2020-04-30 ENCOUNTER — Encounter: Payer: Self-pay | Admitting: Family Medicine

## 2020-05-03 ENCOUNTER — Encounter: Payer: Self-pay | Admitting: Family Medicine

## 2020-05-08 ENCOUNTER — Other Ambulatory Visit: Payer: Self-pay | Admitting: Family Medicine

## 2020-05-08 DIAGNOSIS — N63 Unspecified lump in unspecified breast: Secondary | ICD-10-CM

## 2020-05-28 NOTE — Progress Notes (Signed)
Subjective:   Gavin [name] is a 69 y.o. male who presents for Medicare Annual/Subsequent preventive examination.  I connected with Gavin [name] today by telephone and verified that I am speaking with the correct person using two identifiers. Location patient: home Location provider: work Persons participating in the virtual visit: patient, Marine scientist.    I discussed the limitations, risks, security and privacy concerns of performing an evaluation and management service by telephone and the availability of in person appointments. I also discussed with the patient that there may be a patient responsible charge related to this service. The patient expressed understanding and verbally consented to this telephonic visit.    Interactive audio and video telecommunications were attempted between this provider and patient, however failed, due to patient having technical difficulties OR patient did not have access to video capability.  We continued and completed visit with audio only.  Some vital signs may be absent or patient reported.   Time Spent with patient on telephone encounter: 30 minutes   Review of Systems           Objective:    There were no vitals filed for this visit. There is no height or weight on file to calculate BMI.  Advanced Directives 05/02/2019  Does Patient Have a Medical Advance Directive? No  Would patient like information on creating a medical advance directive? No - Patient declined    Current Medications (verified) Outpatient Encounter Medications as of 05/29/2020  Medication Sig  . aspirin EC 81 MG tablet Take 81 mg by mouth daily.  Marland Kitchen atorvastatin (LIPITOR) 40 MG tablet TAKE 1 TABLET BY MOUTH EVERY DAY  . Cholecalciferol (VITAMIN D PO) Take 1 capsule by mouth daily.  . Coenzyme Q10 (COQ-10) 200 MG CAPS Take 1 capsule by mouth daily.  . Fluorouracil (TOLAK) 4 % CREA Apply 1 application topically at bedtime. (Patient not taking: Reported on 04/26/2020)  .  hydrocortisone (ANUSOL-HC) 2.5 % rectal cream Place 1 application rectally 2 (two) times daily. (Patient not taking: Reported on 04/26/2020)   No facility-administered encounter medications on file as of 05/29/2020.    Allergies (verified) Penicillins   History: Past Medical History:  Diagnosis Date  . Allergic rhinitis   . 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 CAD (coronary artery disease)    CABG x 4 2004 [facility]).  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  . Cataract    Bilat: has lens implants bilat.  . Clotting disorder ([place]) 2004  . Colon polyp 09/19/2010   adenomas, recall 3 yrs.  . Depression   . Family history of colon cancer    Brother: in his 28s.  . Hemorrhoids   . History of fusion of cervical spine   . History of left lateral epicondylitis   . History of retinal detachment    OU  . Migraines   . Myocardial infarct ([employer])    2004, when pt's stent clotted.  [facility] at that [facility].  . Prediabetes    04/2019-->HbA1c 5.9% (unchanged compared to 2/3 yrs prior). 5.8% 11/2019  . Pseudophakia   . RBBB    Past Surgical History:  Procedure Laterality Date  . CARDIOVASCULAR STRESS TEST  2013   No ischemia.  EF 70%.  Marland Kitchen CATARACT EXTRACTION, BILATERAL     with lens implants  . CERVICAL FUSION  2006   C5/6  . CHOLECYSTECTOMY  2004  . COLONOSCOPY  09/19/2010; 11/02/13; 04/28/18  2012: repeat 3 yrs, benign adanomas.  2015 same.  2019 same.  Recall 3 yrs.  . CORONARY ARTERY BYPASS GRAFT  2004   CABGx4  . CORONARY STENT PLACEMENT     BMS in 1990s.  Marland Kitchen POLYPECTOMY    . RETINAL DETACHMENT SURGERY N/A    2011 & 2013 both eyes affected  . TONSILLECTOMY  1956   Family History  Problem Relation Age of Onset  . Arthritis Mother   . Diabetes Mother   . Osteoporosis Mother   . Depression Father   . Heart attack Father   . Heart disease Father   . Hyperlipidemia Father   . Hypertension Father   . Stroke Father   . Aortic  aneurysm Father   . Cancer Brother   . Heart disease Brother   . Hyperlipidemia Brother   . Hypertension Brother   . Colon cancer Brother 9  . Colon polyps Brother   . Diabetes Maternal Grandmother   . Early death Paternal Grandfather   . Colon cancer Brother   . Esophageal cancer Neg Hx   . Rectal cancer Neg Hx   . Stomach cancer Neg Hx    Social History   Socioeconomic History  . Marital status: Married    Spouse name: Not on file  . Number of children: Not on file  . Years of education: Not on file  . Highest education level: Not on file  Occupational History  . Not on file  Tobacco Use  . Smoking status: Never Smoker  . Smokeless tobacco: Never Used  Vaping Use  . Vaping Use: Never used  Substance and Sexual Activity  . Alcohol use: Yes    Alcohol/week: 5.0 standard drinks    Types: 5 Glasses of wine per week    Comment: 1 glass a wine a day   . Drug use: Never  . Sexual activity: Yes  Other Topics Concern  . Not on file  Social History Narrative   Married, 2 grown children.  No grandchildren.   Masters Degree: Marketing executive.   [name]; retired Chief Financial Officer.   Alc: 2 drinks per week.   No tob.   Social Determinants of Health   Financial Resource Strain:   . Difficulty of Paying Living Expenses: Not on file  Food Insecurity:   . Worried About Charity fundraiser in the Last Year: Not on file  . Ran Out of Food in the Last Year: Not on file  Transportation Needs:   . Lack of Transportation (Medical): Not on file  . Lack of Transportation (Non-Medical): Not on file  Physical Activity:   . Days of Exercise per Week: Not on file  . Minutes of Exercise per Session: Not on file  Stress:   . Feeling of Stress : Not on file  Social Connections:   . Frequency of Communication with Friends and Family: Not on file  . Frequency of Social Gatherings with Friends and Family: Not on file  . Attends Religious Services: Not on file  . Active Member of Clubs or Organizations:  Not on file  . Attends Archivist Meetings: Not on file  . Marital Status: Not on file    Tobacco Counseling Counseling given: Not Answered   Clinical Intake:                 Diabetic?No         Activities of Daily Living No flowsheet data found.  Patient Care Team: Tammi Sou,  MD as PCP - General (Family Medicine) Lorretta Harp, MD as PCP - Cardiology (Cardiology) Lorretta Harp, MD as Consulting Physician (Cardiology) Pyrtle, Lajuan Lines, MD as Consulting Physician (Gastroenterology)  Indicate any recent Medical Services you may have received from other than [facility] providers in the past year (date may be approximate).     Assessment:   This is a routine wellness examination for Gavin [name].  Hearing/Vision screen No exam data present  Dietary issues and exercise activities discussed:    Goals    . Increase physical activity     Increase activity to lose weight. (Goal 220 lb)      Depression Screen PHQ 2/9 Scores 04/26/2020 01/29/2020 05/02/2019 11/18/2018 05/20/2018  PHQ - 2 Score 0 0 0 0 0  PHQ- 9 Score - 0 - 1 -    Fall Risk Fall Risk  04/26/2020 05/02/2019 05/20/2018  Falls in the past year? 0 0 0  Number falls in past yr: 0 0 -  Injury with Fall? 0 0 -  Follow up - Falls prevention discussed -    Any stairs in or around the home? Yes  If so, are there any without handrails? No  Home free of loose throw rugs in walkways, pet beds, electrical cords, etc? Yes  Adequate lighting in your home to reduce risk of falls? Yes   ASSISTIVE DEVICES UTILIZED TO PREVENT FALLS:  Life alert? No  Use of a cane, walker or w/c? No  Grab bars in the bathroom? Yes  Shower chair or bench in shower? No  Elevated toilet seat or a handicapped toilet? No   TIMED UP AND GO:  Was the test performed? No . Phone visit   Cognitive Function:No cognitive impairment noted MMSE - Mini Mental State Exam 05/02/2019  Orientation to time 5  Orientation to  Place 5  Registration 3  Attention/ Calculation 5  Recall 3  Language- name 2 objects 2  Language- repeat 1  Language- follow 3 step command 3  Language- read & follow direction 1  Write a sentence 1  Copy design 1  Total score 30        Immunizations Immunization History  Administered Date(s) Administered  . Fluad Quad(high Dose 65+) 04/05/2019  . Hepatitis A 02/07/2007, 07/11/2007  . Hepatitis B 01/07/2007, 02/07/2007, 07/11/2007  . IPV 01/07/2007  . Influenza, High Dose Seasonal PF 04/07/2018, 03/16/2020  . Influenza-Unspecified 05/09/2015, 03/27/2016, 04/28/2017  . MMR 01/07/2007  . PFIZER SARS-COV-2 Vaccination 09/11/2019, 10/02/2019, 04/12/2020  . PPD Test 07/20/2010  . Pneumococcal Conjugate-13 05/09/2015  . Pneumococcal Polysaccharide-23 03/28/2014  . Pneumococcal-Unspecified 07/21/2015  . Td 07/21/2015  . Tdap 03/28/2014  . Typhoid Live 01/07/2007, 09/09/2010  . Zoster 03/28/2014  . Zoster Recombinat (Shingrix) 06/19/2018, 09/16/2018    TDAP status: Up to date   Flu Vaccine status: Up to date   Pneumococcal vaccine status: Up to date   Covid-19 vaccine status: Completed vaccines  Qualifies for Shingles Vaccine? No   Zostavax completed Yes   Shingrix Completed?: Yes  Screening Tests Health Maintenance  Topic Date Due  . Hepatitis C Screening  Never done  . COLONOSCOPY  04/28/2021  . TETANUS/TDAP  07/20/2025  . INFLUENZA VACCINE  Completed  . COVID-19 Vaccine  Completed  . PNA vac Low Risk Adult  Completed    Health Maintenance  Health Maintenance Due  Topic Date Due  . Hepatitis C Screening  Never done    Colorectal cancer screening: Completed Colonoscopy-04/28/2018. Repeat every 3  years  Lung Cancer Screening: (Low Dose CT Chest recommended if Age 23-80 years, 30 pack-year currently smoking OR have quit w/in 15years.) does not qualify.     Additional Screening:  Hepatitis C Screening: does qualify; Ordered. To be collected at next  office visit  Vision Screening: Recommended annual ophthalmology exams for early detection of glaucoma and other disorders of the eye. Is the patient up to date with their annual eye exam?  No  Who is the provider or what is the name of the office in which the patient attends annual eye exams? unknown Patient would like to know who Dr. Anitra Lauth recommends.  Dental Screening: Recommended annual dental exams for proper oral hygiene  Community Resource Referral / Chronic Care Management: CRR required this visit?  Yes For information on social activities & enrichment  CCM required this visit?  No      Plan:     I have personally reviewed and noted the following in the patient's chart:   . Medical and social history . Use of alcohol, tobacco or illicit drugs  . Current medications and supplements . Functional ability and status . Nutritional status . Physical activity . Advanced directives . List of other physicians . Hospitalizations, surgeries, and ER visits in previous 12 months . Vitals . Screenings to include cognitive, depression, and falls . Referrals and appointments  In addition, I have reviewed and discussed with patient certain preventive protocols, quality metrics, and best practice recommendations. A written personalized care plan for preventive services as well as general preventive health recommendations were provided to patient.   Due to this being a telephonic visit, the after visit summary with patients personalized plan was offered to patient via mail or my-chart.  Patient would like to access on my-chart.   Marta Antu, LPN   [id number]  Nurse Health Advisor  Nurse Notes: None

## 2020-05-29 ENCOUNTER — Ambulatory Visit
Admission: RE | Admit: 2020-05-29 | Discharge: 2020-05-29 | Disposition: A | Discharge status: Home / Self Care | Payer: Medicare HMO | Source: Ambulatory Visit | Attending: Family Medicine | Admitting: Family Medicine

## 2020-05-29 ENCOUNTER — Telehealth: Payer: Self-pay

## 2020-05-29 ENCOUNTER — Other Ambulatory Visit: Payer: Self-pay

## 2020-05-29 ENCOUNTER — Ambulatory Visit (INDEPENDENT_AMBULATORY_CARE_PROVIDER_SITE_OTHER): Payer: Medicare HMO

## 2020-05-29 VITALS — Ht 73.5 in | Wt 220.0 lb

## 2020-05-29 DIAGNOSIS — Z0000 Encounter for general adult medical examination without abnormal findings: Secondary | ICD-10-CM | POA: Diagnosis not present

## 2020-05-29 DIAGNOSIS — N63 Unspecified lump in unspecified breast: Secondary | ICD-10-CM

## 2020-05-29 DIAGNOSIS — Z1159 Encounter for screening for other viral diseases: Secondary | ICD-10-CM

## 2020-05-29 DIAGNOSIS — R928 Other abnormal and inconclusive findings on diagnostic imaging of breast: Secondary | ICD-10-CM | POA: Diagnosis not present

## 2020-05-29 DIAGNOSIS — N6489 Other specified disorders of breast: Secondary | ICD-10-CM | POA: Diagnosis not present

## 2020-05-29 NOTE — Telephone Encounter (Signed)
Dr. Zadie Rhine if needs retinal specialist. Anyone at [facility] if he wants more of a cataract disease specialist.

## 2020-05-29 NOTE — Telephone Encounter (Signed)
Patient would like to know who Dr. Anitra Lauth recommends for an Ophthalmologist.

## 2020-05-29 NOTE — Patient Instructions (Signed)
Mr. Gavin [name] , Thank you for taking time to complete your Medicare Wellness Visit. I appreciate your ongoing commitment to your health goals. Please review the following plan we discussed and let me know if I can assist you in the future.   Screening recommendations/referrals: Colonoscopy: Completed 04/28/2018- Due 04/28/2021 Recommended yearly ophthalmology/optometry visit for glaucoma screening and checkup Recommended yearly dental visit for hygiene and checkup Hepatitis C Screening ordered today: Lab to be drawn at your next visit.  Vaccinations: Influenza vaccine: Up to date Pneumococcal vaccine: Completed vaccines Tdap vaccine: Up to date-Due-07/20/2025 Shingles vaccine: Completed vaccines  Covid-19: Completed vaccines  Advanced directives: Declined information today.  Conditions/risks identified: See problem list  Next appointment: Follow up in one year for your annual wellness visit. 06/04/2021 @ 1:30pm.  Preventive Care 65 Years and Older, Male Preventive care refers to lifestyle choices and visits with your health care provider that can promote health and wellness. What does preventive care include?  A yearly physical exam. This is also called an annual well check.  Dental exams once or twice a year.  Routine eye exams. Ask your health care provider how often you should have your eyes checked.  Personal lifestyle choices, including:  Daily care of your teeth and gums.  Regular physical activity.  Eating a healthy diet.  Avoiding tobacco and drug use.  Limiting alcohol use.  Practicing safe sex.  Taking low doses of aspirin every day.  Taking vitamin and mineral supplements as recommended by your health care provider. What happens during an annual well check? The services and screenings done by your health care provider during your annual well check will depend on your age, overall health, lifestyle risk factors, and family history of disease. Counseling  Your  health care provider may ask you questions about your:  Alcohol use.  Tobacco use.  Drug use.  Emotional well-being.  Home and relationship well-being.  Sexual activity.  Eating habits.  History of falls.  Memory and ability to understand (cognition).  Work and work Statistician. Screening  You may have the following tests or measurements:  Height, weight, and BMI.  Blood pressure.  Lipid and cholesterol levels. These may be checked every 5 years, or more frequently if you are over 69 years old.  Skin check.  Lung cancer screening. You may have this screening every year starting at age 57 if you have a 30-pack-year history of smoking and currently smoke or have quit within the past 15 years.  Fecal occult blood test (FOBT) of the stool. You may have this test every year starting at age 48.  Flexible sigmoidoscopy or colonoscopy. You may have a sigmoidoscopy every 5 years or a colonoscopy every 10 years starting at age 57.  Prostate cancer screening. Recommendations will vary depending on your family history and other risks.  Hepatitis C blood test.  Hepatitis B blood test.  Sexually transmitted disease (STD) testing.  Diabetes screening. This is done by checking your blood sugar (glucose) after you have not eaten for a while (fasting). You may have this done every 1-3 years.  Abdominal aortic aneurysm (AAA) screening. You may need this if you are a current or former smoker.  Osteoporosis. You may be screened starting at age 51 if you are at high risk. Talk with your health care provider about your test results, treatment options, and if necessary, the need for more tests. Vaccines  Your health care provider may recommend certain vaccines, such as:  Influenza vaccine. This is  recommended every year.  Tetanus, diphtheria, and acellular pertussis (Tdap, Td) vaccine. You may need a Td booster every 10 years.  Zoster vaccine. You may need this after age  76.  Pneumococcal 13-valent conjugate (PCV13) vaccine. One dose is recommended after age 2.  Pneumococcal polysaccharide (PPSV23) vaccine. One dose is recommended after age 69. Talk to your health care provider about which screenings and vaccines you need and how often you need them. This information is not intended to replace advice given to you by your health care provider. Make sure you discuss any questions you have with your health care provider. Document Released: 08/02/2015 Document Revised: 03/25/2016 Document Reviewed: 05/07/2015 Elsevier Interactive Patient Education  2017 [place] Prevention in the Home Falls can cause injuries. They can happen to people of all ages. There are many things you can do to make your home safe and to help prevent falls. What can I do on the outside of my home?  Regularly fix the edges of walkways and driveways and fix any cracks.  Remove anything that might make you trip as you walk through a door, such as a raised step or threshold.  Trim any bushes or trees on the path to your home.  Use bright outdoor lighting.  Clear any walking paths of anything that might make someone trip, such as rocks or tools.  Regularly check to see if handrails are loose or broken. Make sure that both sides of any steps have handrails.  Any raised decks and porches should have guardrails on the edges.  Have any leaves, snow, or ice cleared regularly.  Use sand or salt on walking paths during winter.  Clean up any spills in your garage right away. This includes oil or grease spills. What can I do in the bathroom?  Use night lights.  Install grab bars by the toilet and in the tub and shower. Do not use towel bars as grab bars.  Use non-skid mats or decals in the tub or shower.  If you need to sit down in the shower, use a plastic, non-slip stool.  Keep the floor dry. Clean up any water that spills on the floor as soon as it happens.  Remove  soap buildup in the tub or shower regularly.  Attach bath mats securely with double-sided non-slip rug tape.  Do not have throw rugs and other things on the floor that can make you trip. What can I do in the bedroom?  Use night lights.  Make sure that you have a light by your bed that is easy to reach.  Do not use any sheets or blankets that are too big for your bed. They should not hang down onto the floor.  Have a firm chair that has side arms. You can use this for support while you get dressed.  Do not have throw rugs and other things on the floor that can make you trip. What can I do in the kitchen?  Clean up any spills right away.  Avoid walking on wet floors.  Keep items that you use a lot in easy-to-reach places.  If you need to reach something above you, use a strong step stool that has a grab bar.  Keep electrical cords out of the way.  Do not use floor polish or wax that makes floors slippery. If you must use wax, use non-skid floor wax.  Do not have throw rugs and other things on the floor that can make you trip.  What can I do with my stairs?  Do not leave any items on the stairs.  Make sure that there are handrails on both sides of the stairs and use them. Fix handrails that are broken or loose. Make sure that handrails are as long as the stairways.  Check any carpeting to make sure that it is firmly attached to the stairs. Fix any carpet that is loose or worn.  Avoid having throw rugs at the top or bottom of the stairs. If you do have throw rugs, attach them to the floor with carpet tape.  Make sure that you have a light switch at the top of the stairs and the bottom of the stairs. If you do not have them, ask someone to add them for you. What else can I do to help prevent falls?  Wear shoes that:  Do not have high heels.  Have rubber bottoms.  Are comfortable and fit you well.  Are closed at the toe. Do not wear sandals.  If you use a  stepladder:  Make sure that it is fully opened. Do not climb a closed stepladder.  Make sure that both sides of the stepladder are locked into place.  Ask someone to hold it for you, if possible.  Clearly mark and make sure that you can see:  Any grab bars or handrails.  First and last steps.  Where the edge of each step is.  Use tools that help you move around (mobility aids) if they are needed. These include:  Canes.  Walkers.  Scooters.  Crutches.  Turn on the lights when you go into a dark area. Replace any light bulbs as soon as they burn out.  Set up your furniture so you have a clear path. Avoid moving your furniture around.  If any of your floors are uneven, fix them.  If there are any pets around you, be aware of where they are.  Review your medicines with your doctor. Some medicines can make you feel dizzy. This can increase your chance of falling. Ask your doctor what other things that you can do to help prevent falls. This information is not intended to replace advice given to you by your health care provider. Make sure you discuss any questions you have with your health care provider. Document Released: 05/02/2009 Document Revised: 12/12/2015 Document Reviewed: 08/10/2014 Elsevier Interactive Patient Education  2017 [employer].

## 2020-05-30 NOTE — Telephone Encounter (Signed)
Spoke with pt regarding recommendations. ? ?

## 2020-06-04 ENCOUNTER — Telehealth: Payer: Self-pay | Admitting: Family Medicine

## 2020-06-04 NOTE — Telephone Encounter (Signed)
   SF 06/04/2020   Name: Gavin [name]   MRN: [id number]   DOB: Oct 31, 1950   AGE: 69 y.o.   GENDER: male   PCP McGowen, Adrian Blackwater, MD.     Called patient regarding referral for assistance with senior activities. Patient stated that he moved to the area about two years ago, but one the pandemic hit he and his wife were not able to find social activities for seniors in the area. Gave patient information for [facility] in [place]. Patient was able to look both search both organizations online while on the phone with Care Guide. Mr. Mcdonnell stated he will give the organizations a call to see what current programs they have available. Asked patient if he has any additional needs at this time. Patient stated that he does not.  Informed patient to give the office a call if anything changes. Patient stated understanding.     Closing referral pending any other needs of patient.    [place], Care Management Phone: [phone] Email: [email or web address]

## 2020-06-07 ENCOUNTER — Other Ambulatory Visit: Payer: Self-pay | Admitting: Family Medicine

## 2020-06-10 ENCOUNTER — Ambulatory Visit (INDEPENDENT_AMBULATORY_CARE_PROVIDER_SITE_OTHER): Payer: Medicare HMO | Admitting: Family Medicine

## 2020-06-10 ENCOUNTER — Encounter: Payer: Self-pay | Admitting: Family Medicine

## 2020-06-10 ENCOUNTER — Other Ambulatory Visit: Payer: Self-pay

## 2020-06-10 VITALS — BP 104/67 | HR 68 | Temp 97.4°F | Resp 16 | Ht 72.0 in | Wt 218.8 lb

## 2020-06-10 DIAGNOSIS — Z0000 Encounter for general adult medical examination without abnormal findings: Secondary | ICD-10-CM | POA: Diagnosis not present

## 2020-06-10 DIAGNOSIS — E7800 Pure hypercholesterolemia, unspecified: Secondary | ICD-10-CM | POA: Diagnosis not present

## 2020-06-10 DIAGNOSIS — R7303 Prediabetes: Secondary | ICD-10-CM

## 2020-06-10 DIAGNOSIS — I2510 Atherosclerotic heart disease of native coronary artery without angina pectoris: Secondary | ICD-10-CM | POA: Diagnosis not present

## 2020-06-10 DIAGNOSIS — Z125 Encounter for screening for malignant neoplasm of prostate: Secondary | ICD-10-CM

## 2020-06-10 LAB — PSA, MEDICARE: PSA: 1.51 ng/ml (ref 0.10–4.00)

## 2020-06-10 NOTE — Progress Notes (Signed)
Office Note 06/10/2020  CC:  Chief Complaint  Patient presents with  . Annual Exam    pt is fasting    HPI:  Gavin [name] is a 69 y.o. White male who is here for annual health maintenance exam and f/u prediabetes and HLD.  Pretty busy taking care of mother in law, this cuts in on exercise habits some. Eating healthy, sleeping well. Tolerating atorva 40mg  qd and taking ASA 81mg  qd.  Seems to be coping with anxiety situations pretty well.   Past Medical History:  Diagnosis Date  . Allergic rhinitis   . 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 CAD (coronary artery disease)    CABG x 4 2004 [facility]).  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  . Cataract    Bilat: has lens implants bilat.  . Clotting disorder ([place]) 2004  . Colon polyp 09/19/2010   adenomas, recall 3 yrs.  . Depression   . Family history of colon cancer    Brother: in his 4s.  . Hemorrhoids   . History of fusion of cervical spine   . History of left lateral epicondylitis   . History of retinal detachment    OU  . Migraines   . Myocardial infarct ([place])    2004, when pt's stent clotted.  He got CABG at [facility] at that time.  . Prediabetes    04/2019-->HbA1c 5.9% (unchanged compared to 2/3 yrs prior). 5.8% 11/2019  . Pseudophakia   . RBBB     Past Surgical History:  Procedure Laterality Date  . CARDIOVASCULAR STRESS TEST  2013   No ischemia.  EF 70%.  Marland Kitchen CATARACT EXTRACTION, BILATERAL     with lens implants  . CERVICAL FUSION  2006   C5/6  . CHOLECYSTECTOMY  2004  . COLONOSCOPY  09/19/2010; 11/02/13; 04/28/18   2012: repeat 3 yrs, benign adanomas.  2015 same.  2019 same.  Recall 3 yrs.  . CORONARY ARTERY BYPASS GRAFT  2004   CABGx4  . CORONARY STENT PLACEMENT     BMS in 1990s.  Marland Kitchen POLYPECTOMY    . RETINAL DETACHMENT SURGERY N/A    2011 & 2013 both eyes affected  . TONSILLECTOMY  1956    Family History  Problem Relation Age of Onset  . Arthritis Mother    . Diabetes Mother   . Osteoporosis Mother   . Depression Father   . Heart attack Father   . Heart disease Father   . Hyperlipidemia Father   . Hypertension Father   . Stroke Father   . Aortic aneurysm Father   . Cancer Brother   . Heart disease Brother   . Hyperlipidemia Brother   . Hypertension Brother   . Colon cancer Brother 19  . Colon polyps Brother   . Diabetes Maternal Grandmother   . Early death Paternal Grandfather   . Colon cancer Brother   . Esophageal cancer Neg Hx   . Rectal cancer Neg Hx   . Stomach cancer Neg Hx     Social History   Socioeconomic History  . Marital status: Married    Spouse name: Not on file  . Number of children: Not on file  . Years of education: Not on file  . Highest education level: Not on file  Occupational History  . Not on file  Tobacco Use  . Smoking status: Never Smoker  . Smokeless tobacco: Never Used  Vaping Use  .  Vaping Use: Never used  Substance and Sexual Activity  . Alcohol use: Yes    Alcohol/week: 5.0 standard drinks    Types: 5 Glasses of wine per week    Comment: 1 glass a wine a day   . Drug use: Never  . Sexual activity: Yes  Other Topics Concern  . Not on file  Social History Narrative   Married, 2 grown children.  No grandchildren.   Masters Degree: Marketing executive.   [name]; retired Chief Financial Officer.   Alc: 2 drinks per week.   No tob.   Social Determinants of Health   Financial Resource Strain: Low Risk   . Difficulty of Paying Living Expenses: Not hard at all  Food Insecurity: No Food Insecurity  . Worried About Charity fundraiser in the Last Year: Never true  . Ran Out of Food in the Last Year: Never true  Transportation Needs: No Transportation Needs  . Lack of Transportation (Medical): No  . Lack of Transportation (Non-Medical): No  Physical Activity: Sufficiently Active  . Days of Exercise per Week: 5 days  . Minutes of Exercise per Session: 40 min  Stress: No Stress Concern Present  . Feeling of  Stress : Not at all  Social Connections: Moderately Isolated  . Frequency of Communication with Friends and Family: More than three times a week  . Frequency of Social Gatherings with Friends and Family: Never  . Attends Religious Services: Never  . Active Member of Clubs or Organizations: No  . Attends Archivist Meetings: Never  . Marital Status: Married  Human resources officer Violence: Not At Risk  . Fear of Current or Ex-Partner: No  . Emotionally Abused: No  . Physically Abused: No  . Sexually Abused: No    Outpatient Medications Prior to Visit  Medication Sig Dispense Refill  . aspirin EC 81 MG tablet Take 81 mg by mouth daily.    Marland Kitchen atorvastatin (LIPITOR) 40 MG tablet TAKE 1 TABLET BY MOUTH EVERY DAY 90 tablet 1  . Cholecalciferol (VITAMIN D PO) Take 1 capsule by mouth daily.    . Coenzyme Q10 (COQ-10) 200 MG CAPS Take 1 capsule by mouth daily.    . hydrocortisone (ANUSOL-HC) 2.5 % rectal cream Place 1 application rectally 2 (two) times daily. (Patient not taking: Reported on 06/10/2020) 30 g 3  . Fluorouracil (TOLAK) 4 % CREA Apply 1 application topically at bedtime. (Patient not taking: Reported on 06/10/2020) 40 g 0   No facility-administered medications prior to visit.    Allergies  Allergen Reactions  . Penicillins Anaphylaxis    ROS Review of Systems  Constitutional: Negative for appetite change, chills, fatigue and fever.  HENT: Negative for congestion, dental problem, ear pain and sore throat.   Eyes: Negative for discharge, redness and visual disturbance.  Respiratory: Negative for cough, chest tightness, shortness of breath and wheezing.   Cardiovascular: Negative for chest pain, palpitations and leg swelling.  Gastrointestinal: Negative for abdominal pain, blood in stool, diarrhea, nausea and vomiting.  Genitourinary: Negative for difficulty urinating, dysuria, flank pain, frequency, hematuria and urgency.  Musculoskeletal: Negative for arthralgias, back  pain, joint swelling, myalgias and neck stiffness.  Skin: Negative for pallor and rash.  Neurological: Negative for dizziness, speech difficulty, weakness and headaches.  Hematological: Negative for adenopathy. Does not bruise/bleed easily.  Psychiatric/Behavioral: Negative for confusion and sleep disturbance. The patient is not nervous/anxious.     PE; Vitals with BMI 06/10/2020 05/29/2020 04/26/2020  Height 6\' 0"  6' 1.5" 6'  1.5"  Weight 218 lbs 13 oz 220 lbs 220 lbs  BMI 29.67 40.98 11.91  Systolic 478 - 295  Diastolic 67 - 74  Pulse 68 - 69     Gen: Alert, well appearing.  Patient is oriented to person, place, time, and situation. AFFECT: pleasant, lucid thought and speech. ENT: Ears: EACs clear, normal epithelium.  TMs with good light reflex and landmarks bilaterally.  Eyes: no injection, icteris, swelling, or exudate.  EOMI, PERRLA. Nose: no drainage or turbinate edema/swelling.  No injection or focal lesion.  Mouth: lips without lesion/swelling.  Oral mucosa pink and moist.  Dentition intact and without obvious caries or gingival swelling.  Oropharynx without erythema, exudate, or swelling.  Neck: supple/nontender.  No LAD, mass, or TM.  Carotid pulses 2+ bilaterally, without bruits. CV: RRR, no m/r/g.   LUNGS: CTA bilat, nonlabored resps, good aeration in all lung fields. ABD: soft, NT, ND, BS normal.  No hepatospenomegaly or mass.  No bruits. EXT: no clubbing, cyanosis, or edema.  Musculoskeletal: no joint swelling, erythema, warmth, or tenderness.  ROM of all joints intact. Skin - no jaundice or pallor or suspicious skin lesions. He has mild erythematous papular rash on L arm, similar rash on L cheek and in L nasal crease, with some greasy flaking in this area that is more c/w seb derm.   Pertinent labs:  Lab Results  Component Value Date   TSH 2.14 05/19/2019   Lab Results  Component Value Date   WBC 7.2 05/19/2019   HGB 15.8 05/19/2019   HCT 45.7 05/19/2019   MCV  86.4 05/19/2019   PLT 248 05/19/2019   Lab Results  Component Value Date   CREATININE 0.90 12/20/2019   BUN 15 12/20/2019   NA 140 12/20/2019   K 4.5 12/20/2019   CL 106 12/20/2019   CO2 23 12/20/2019   Lab Results  Component Value Date   ALT 24 12/20/2019   AST 22 12/20/2019   ALKPHOS 77 02/22/2018   BILITOT 0.9 12/20/2019   Lab Results  Component Value Date   CHOL 110 12/20/2019   Lab Results  Component Value Date   HDL 44 12/20/2019   Lab Results  Component Value Date   LDLCALC 45 12/20/2019   Lab Results  Component Value Date   TRIG 128 12/20/2019   Lab Results  Component Value Date   CHOLHDL 2.5 12/20/2019   Lab Results  Component Value Date   PSA 1.46 05/19/2019   PSA 1.18 05/20/2018   Lab Results  Component Value Date   HGBA1C 5.8 (H) 12/20/2019   ASSESSMENT AND PLAN:   1) Prediabetes: TLC. Sounds like he has some very mild sensory changes on toes x years, but 50% better the last 6-12 mo. Hba1c and fasting glucose today.  2) HLD, +CAD w/hx of stent: asymptomatic. Cont ASA and statin. FLP and hepatic panel today. Keep annual cardiology f/u with Dr. Gwenlyn Found.  3) Health maintenance exam: Reviewed age and gender appropriate health maintenance issues (prudent diet, regular exercise, health risks of tobacco and excessive alcohol, use of seatbelts, fire alarms in home, use of sunscreen).  Also reviewed age and gender appropriate health screening as well as vaccine recommendations. Vaccines:  ALL UTD. Labs: fasting HP, Hba1c (prediabetes), and PSA. Prostate ca screening: PSA today. Colon ca screening: FH CC brother, + personal hx polyps, recall 04/2021.  4) Actinic keratoses: L arm and L cheek: 5 FU topical tx has resulted in some mild inflamm rxn---approp for  use of this med.  He'll finish this up today and if inflamm doesn't calm down/resolve over the next couple weeks he'll call/return. Pt states derm MD he saw is not doing any f/u appts or new pt  appts.  An After Visit Summary was printed and given to the patient.  FOLLOW UP:  Return in about 6 months (around 12/08/2020) for routine chronic illness f/u.  Signed:  Crissie Sickles, MD           06/10/2020

## 2020-06-10 NOTE — Patient Instructions (Signed)

## 2020-06-11 ENCOUNTER — Encounter: Payer: Self-pay | Admitting: Family Medicine

## 2020-06-11 LAB — COMPREHENSIVE METABOLIC PANEL
AG Ratio: 2 (calc) (ref 1.0–2.5)
ALT: 22 U/L (ref 9–46)
AST: 20 U/L (ref 10–35)
Albumin: 4.2 g/dL (ref 3.6–5.1)
Alkaline phosphatase (APISO): 81 U/L (ref 35–144)
BUN: 17 mg/dL (ref 7–25)
CO2: 25 mmol/L (ref 20–32)
Calcium: 9.2 mg/dL (ref 8.6–10.3)
Chloride: 105 mmol/L (ref 98–110)
Creat: 1.18 mg/dL (ref 0.70–1.18)
Globulin: 2.1 g/dL (calc) (ref 1.9–3.7)
Glucose, Bld: 114 mg/dL — ABNORMAL HIGH (ref 65–99)
Potassium: 4.4 mmol/L (ref 3.5–5.3)
Sodium: 141 mmol/L (ref 135–146)
Total Bilirubin: 1 mg/dL (ref 0.2–1.2)
Total Protein: 6.3 g/dL (ref 6.1–8.1)

## 2020-06-11 LAB — LIPID PANEL
Cholesterol: 112 mg/dL (ref <200)
HDL: 45 mg/dL (ref >=40)
LDL Cholesterol (Calc): 47 mg/dL (calc)
Non-HDL Cholesterol (Calc): 67 mg/dL (calc) (ref <130)
Total CHOL/HDL Ratio: 2.5 (calc) (ref <5.0)
Triglycerides: 117 mg/dL (ref <150)

## 2020-06-11 LAB — CBC WITH DIFFERENTIAL/PLATELET
Absolute Monocytes: 768 cells/uL (ref 200–950)
Basophils Absolute: 20 cells/uL (ref 0–200)
Basophils Relative: 0.3 %
Eosinophils Absolute: 238 cells/uL (ref 15–500)
Eosinophils Relative: 3.5 %
HCT: 45.7 % (ref 38.5–50.0)
Hemoglobin: 15.7 g/dL (ref 13.2–17.1)
Lymphs Abs: 1578 cells/uL (ref 850–3900)
MCH: 29.1 pg (ref 27.0–33.0)
MCHC: 34.4 g/dL (ref 32.0–36.0)
MCV: 84.8 fL (ref 80.0–100.0)
MPV: 10.3 fL (ref 7.5–12.5)
Monocytes Relative: 11.3 %
Neutro Abs: 4196 cells/uL (ref 1500–7800)
Neutrophils Relative %: 61.7 %
Platelets: 236 10*3/uL (ref 140–400)
RBC: 5.39 10*6/uL (ref 4.20–5.80)
RDW: 12.8 % (ref 11.0–15.0)
Total Lymphocyte: 23.2 %
WBC: 6.8 10*3/uL (ref 3.8–10.8)

## 2020-06-11 LAB — HEMOGLOBIN A1C
Hgb A1c MFr Bld: 6.2 % of total Hgb — ABNORMAL HIGH (ref <5.7)
Mean Plasma Glucose: 131 (calc)
eAG (mmol/L): 7.3 (calc)

## 2020-06-11 LAB — TSH: TSH: 3.17 mIU/L (ref 0.40–4.50)

## 2020-06-11 NOTE — Telephone Encounter (Signed)
Pls see my result note attached to his lab results for details that address his question.-thx

## 2020-06-11 NOTE — Telephone Encounter (Signed)
Pt aware of results 

## 2020-06-14 ENCOUNTER — Other Ambulatory Visit: Payer: Self-pay

## 2020-06-14 ENCOUNTER — Encounter: Payer: Self-pay | Admitting: Family Medicine

## 2020-06-14 MED ORDER — METFORMIN HCL 500 MG PO TABS: 500.0000 mg | ORAL_TABLET | Freq: Every day | ORAL | 5 refills | Status: DC

## 2020-06-14 NOTE — Telephone Encounter (Signed)
Rx sent to pharmacy and pt is aware of new Rx

## 2020-06-24 ENCOUNTER — Encounter: Payer: Medicare HMO | Admitting: Family Medicine

## 2020-07-22 DIAGNOSIS — H52223 Regular astigmatism, bilateral: Secondary | ICD-10-CM | POA: Diagnosis not present

## 2020-07-22 DIAGNOSIS — H524 Presbyopia: Secondary | ICD-10-CM | POA: Diagnosis not present

## 2020-07-22 DIAGNOSIS — H5213 Myopia, bilateral: Secondary | ICD-10-CM | POA: Diagnosis not present

## 2020-08-02 DIAGNOSIS — Z0100 Encounter for examination of eyes and vision without abnormal findings: Secondary | ICD-10-CM | POA: Diagnosis not present

## 2020-08-16 ENCOUNTER — Encounter: Payer: Self-pay | Admitting: Family Medicine

## 2020-08-16 NOTE — Telephone Encounter (Signed)
Stop metformin and we'll see if GI sx's/diarrhea resolves completely.

## 2020-10-24 DIAGNOSIS — I252 Old myocardial infarction: Secondary | ICD-10-CM | POA: Diagnosis not present

## 2020-10-24 DIAGNOSIS — J309 Allergic rhinitis, unspecified: Secondary | ICD-10-CM | POA: Diagnosis not present

## 2020-10-24 DIAGNOSIS — R030 Elevated blood-pressure reading, without diagnosis of hypertension: Secondary | ICD-10-CM | POA: Diagnosis not present

## 2020-10-24 DIAGNOSIS — E663 Overweight: Secondary | ICD-10-CM | POA: Diagnosis not present

## 2020-10-24 DIAGNOSIS — I2510 Atherosclerotic heart disease of native coronary artery without angina pectoris: Secondary | ICD-10-CM | POA: Diagnosis not present

## 2020-10-24 DIAGNOSIS — N529 Male erectile dysfunction, unspecified: Secondary | ICD-10-CM | POA: Diagnosis not present

## 2020-10-24 DIAGNOSIS — E785 Hyperlipidemia, unspecified: Secondary | ICD-10-CM | POA: Diagnosis not present

## 2020-10-24 DIAGNOSIS — R69 Illness, unspecified: Secondary | ICD-10-CM | POA: Diagnosis not present

## 2020-10-24 DIAGNOSIS — Z6829 Body mass index (BMI) 29.0-29.9, adult: Secondary | ICD-10-CM | POA: Diagnosis not present

## 2020-10-24 DIAGNOSIS — E119 Type 2 diabetes mellitus without complications: Secondary | ICD-10-CM | POA: Diagnosis not present

## 2020-12-05 ENCOUNTER — Other Ambulatory Visit: Payer: Self-pay | Admitting: Family Medicine

## 2020-12-09 ENCOUNTER — Encounter: Payer: Self-pay | Admitting: Family Medicine

## 2020-12-09 ENCOUNTER — Ambulatory Visit (INDEPENDENT_AMBULATORY_CARE_PROVIDER_SITE_OTHER): Payer: Medicare HMO | Admitting: Family Medicine

## 2020-12-09 ENCOUNTER — Other Ambulatory Visit: Payer: Self-pay

## 2020-12-09 VITALS — BP 96/61 | HR 63 | Temp 97.5°F | Resp 16 | Ht 72.0 in | Wt 214.0 lb

## 2020-12-09 DIAGNOSIS — G5793 Unspecified mononeuropathy of bilateral lower limbs: Secondary | ICD-10-CM

## 2020-12-09 DIAGNOSIS — I2510 Atherosclerotic heart disease of native coronary artery without angina pectoris: Secondary | ICD-10-CM

## 2020-12-09 DIAGNOSIS — R7303 Prediabetes: Secondary | ICD-10-CM

## 2020-12-09 DIAGNOSIS — E7800 Pure hypercholesterolemia, unspecified: Secondary | ICD-10-CM

## 2020-12-09 MED ORDER — GABAPENTIN 100 MG PO CAPS: 100.0000 mg | ORAL_CAPSULE | Freq: Three times a day (TID) | ORAL | 1 refills | Status: DC

## 2020-12-09 MED ORDER — ATORVASTATIN CALCIUM 40 MG PO TABS: 1.0000 | ORAL_TABLET | Freq: Every day | ORAL | 3 refills | Status: DC

## 2020-12-09 NOTE — Progress Notes (Signed)
OFFICE VISIT  12/09/2020  CC:  Chief Complaint  Patient presents with  . Follow-up    RCI, 6 mo. Pt is fasting  . HLD    HPI:    Patient is a 70 y.o. Caucasian male who presents for 6 mo f/u prediabetes. A/P as of last visit: "1) Prediabetes: TLC. Sounds like he has some very mild sensory changes on toes x years, but 50% better the last 6-12 mo. Hba1c and fasting glucose today.  2) HLD, +CAD w/hx of CABG: asymptomatic. Cont ASA and statin. FLP and hepatic panel today. Keep annual cardiology f/u with Dr. Gwenlyn Found.  3) Health maintenance exam: Reviewed age and gender appropriate health maintenance issues (prudent diet, regular exercise, health risks of tobacco and excessive alcohol, use of seatbelts, fire alarms in home, use of sunscreen).  Also reviewed age and gender appropriate health screening as well as vaccine recommendations. Vaccines:  ALL UTD. Labs: fasting HP, Hba1c (prediabetes), and PSA. Prostate ca screening: PSA today. Colon ca screening: FH CC brother, + personal hx polyps, recall 04/2021.  4) Actinic keratoses: L arm and L cheek: 5 FU topical tx has resulted in some mild inflamm rxn---approp for use of this med.  He'll finish this up today and if inflamm doesn't calm down/resolve over the next couple weeks he'll call/return. Pt states derm MD he saw is not doing any f/u appts or new pt appts."  INTERIM HX: After last visit we decided to start him on metformin 500mg  with supper for his prediabetes b/c a1c rose to 6.2%. Lots of GI intol sx's from metformin (lower abd discomf and loose BMs), although he says feet tingling improved on this med. He d/c'd it for about 2 mo but then restarted it, takes it at largest meal (lunch) and sx's are gradually getting less.  He did improve his diet significantly.  Still c/o feet burning constantly in distal 1/3 all day, worse at night while laying down, esp outer 3 toes on R foot.  Sx's present since 2008.    Past Medical  History:  Diagnosis Date  . Allergic rhinitis   . 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 CAD (coronary artery disease)    CABG x 4 2004 [facility]).  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  . Cataract    Bilat: has lens implants bilat.  . Clotting disorder ([place]) 2004  . Colon polyp 09/19/2010   adenomas, recall 3 yrs.  . Depression   . Family history of colon cancer    Brother: in his 50s.  . Hemorrhoids   . History of fusion of cervical spine   . History of left lateral epicondylitis   . History of retinal detachment    OU  . Migraines   . Myocardial infarct ([place])    2004, when pt's stent clotted.  He got CABG at [facility] at that time.  . Prediabetes    04/2019-->HbA1c 5.9% (unchanged compared to 2/3 yrs prior). 5.8% 11/2019  6.2% 05/2020  . Pseudophakia   . RBBB     Past Surgical History:  Procedure Laterality Date  . CARDIOVASCULAR STRESS TEST  2013   No ischemia.  EF 70%.  Marland Kitchen CATARACT EXTRACTION, BILATERAL     with lens implants  . CERVICAL FUSION  2006   C5/6  . CHOLECYSTECTOMY  2004  . COLONOSCOPY  09/19/2010; 11/02/13; 04/28/18   2012: repeat 3 yrs, benign adanomas.  2015 same.  2019 same.  Recall 3 yrs.  . CORONARY ARTERY BYPASS GRAFT  2004   CABGx4  . CORONARY STENT PLACEMENT     BMS in 1990s.  Marland Kitchen POLYPECTOMY    . RETINAL DETACHMENT SURGERY N/A    2011 & 2013 both eyes affected  . TONSILLECTOMY  1956    Outpatient Medications Prior to Visit  Medication Sig Dispense Refill  . aspirin EC 81 MG tablet Take 81 mg by mouth daily.    Marland Kitchen atorvastatin (LIPITOR) 40 MG tablet TAKE 1 TABLET BY MOUTH EVERY DAY 30 tablet 0  . Cholecalciferol (VITAMIN D PO) Take 1 capsule by mouth daily.    . Coenzyme Q10 (COQ-10) 200 MG CAPS Take 1 capsule by mouth daily.    . metFORMIN (GLUCOPHAGE) 500 MG tablet Take 1 tablet (500 mg total) by mouth daily with supper. 30 tablet 5  . hydrocortisone (ANUSOL-HC) 2.5 % rectal cream Place 1 application rectally 2  (two) times daily. (Patient not taking: No sig reported) 30 g 3   No facility-administered medications prior to visit.    Allergies  Allergen Reactions  . Penicillins Anaphylaxis    ROS As per HPI  PE: Vitals with BMI 12/09/2020 06/10/2020 05/29/2020  Height 6\' 0"  6\' 0"  6' 1.5"  Weight 214 lbs 218 lbs 13 oz 220 lbs  BMI 29.02 72.53 66.44  Systolic 96 034 -  Diastolic 61 67 -  Pulse 63 68 -     Gen: Alert, well appearing.  Patient is oriented to person, place, time, and situation. AFFECT: pleasant, lucid thought and speech. Foot exam - -no swelling, tenderness or skin or vascular lesions. Color and temperature is normal. Sensation is intact. Peripheral pulses are palpable. Toenails are normal.   LABS:  Lab Results  Component Value Date   TSH 3.17 06/10/2020   Lab Results  Component Value Date   WBC 6.8 06/10/2020   HGB 15.7 06/10/2020   HCT 45.7 06/10/2020   MCV 84.8 06/10/2020   PLT 236 06/10/2020   Lab Results  Component Value Date   CREATININE 1.18 06/10/2020   BUN 17 06/10/2020   NA 141 06/10/2020   K 4.4 06/10/2020   CL 105 06/10/2020   CO2 25 06/10/2020   Lab Results  Component Value Date   ALT 22 06/10/2020   AST 20 06/10/2020   ALKPHOS 77 02/22/2018   BILITOT 1.0 06/10/2020   Lab Results  Component Value Date   CHOL 112 06/10/2020   Lab Results  Component Value Date   HDL 45 06/10/2020   Lab Results  Component Value Date   LDLCALC 47 06/10/2020   Lab Results  Component Value Date   TRIG 117 06/10/2020   Lab Results  Component Value Date   CHOLHDL 2.5 06/10/2020   Lab Results  Component Value Date   PSA 1.51 06/10/2020   PSA 1.46 05/19/2019   PSA 1.18 05/20/2018   Lab Results  Component Value Date   HGBA1C 6.2 (H) 06/10/2020   IMPRESSION AND PLAN:  1) Prediabetes: intol of metformin but seeming to gradually get better. Unsure how much he's been taking this med but sounds like most of the last few months. Rechecking a1c  today, plan on switching to Metformin xr IF he continues on metformin at all. If a1c improves he'll d/c med.  2) neuropathic pain bilat feet. Regarding his chronic/longstanding feet neuropathic pain, I don't think it is related to prediabetic state.  Sounds like gradual progression of sx's. We decided on a  trial of gabapentin low dose: 100mg  tid.  Therapeutic expectations and side effect profile of medication discussed today.  Patient's questions answered. He declined neurology referral today.  3) HLD: goal LDL <70, was 47 six mo ago. FLP and hepatic panel today.  An After Visit Summary was printed and given to the patient.  FOLLOW UP: Return in about 4 weeks (around 01/06/2021) for f/u feet burning/gabapentin.  Signed:  Crissie Sickles, MD           12/09/2020

## 2020-12-10 ENCOUNTER — Encounter: Payer: Self-pay | Admitting: Family Medicine

## 2020-12-10 LAB — COMPREHENSIVE METABOLIC PANEL
AG Ratio: 2.2 (calc) (ref 1.0–2.5)
ALT: 23 U/L (ref 9–46)
AST: 23 U/L (ref 10–35)
Albumin: 4.2 g/dL (ref 3.6–5.1)
Alkaline phosphatase (APISO): 75 U/L (ref 35–144)
BUN: 17 mg/dL (ref 7–25)
CO2: 25 mmol/L (ref 20–32)
Calcium: 9 mg/dL (ref 8.6–10.3)
Chloride: 103 mmol/L (ref 98–110)
Creat: 1 mg/dL (ref 0.70–1.18)
Globulin: 1.9 g/dL (calc) (ref 1.9–3.7)
Glucose, Bld: 44 mg/dL — ABNORMAL LOW (ref 65–99)
Potassium: 4.2 mmol/L (ref 3.5–5.3)
Sodium: 142 mmol/L (ref 135–146)
Total Bilirubin: 1.3 mg/dL — ABNORMAL HIGH (ref 0.2–1.2)
Total Protein: 6.1 g/dL (ref 6.1–8.1)

## 2020-12-10 LAB — LIPID PANEL
Cholesterol: 98 mg/dL (ref <200)
HDL: 42 mg/dL (ref >=40)
LDL Cholesterol (Calc): 37 mg/dL (calc)
Non-HDL Cholesterol (Calc): 56 mg/dL (calc) (ref <130)
Total CHOL/HDL Ratio: 2.3 (calc) (ref <5.0)
Triglycerides: 103 mg/dL (ref <150)

## 2020-12-10 LAB — SPECIMEN COMPROMISED

## 2020-12-10 LAB — HEMOGLOBIN A1C
Hgb A1c MFr Bld: 5.7 % of total Hgb — ABNORMAL HIGH (ref <5.7)
Mean Plasma Glucose: 117 mg/dL
eAG (mmol/L): 6.5 mmol/L

## 2021-01-06 ENCOUNTER — Ambulatory Visit (INDEPENDENT_AMBULATORY_CARE_PROVIDER_SITE_OTHER): Payer: Medicare HMO | Admitting: Family Medicine

## 2021-01-06 ENCOUNTER — Other Ambulatory Visit: Payer: Self-pay

## 2021-01-06 ENCOUNTER — Encounter: Payer: Self-pay | Admitting: Family Medicine

## 2021-01-06 VITALS — BP 111/73 | HR 73 | Temp 98.6°F | Ht 72.0 in | Wt 212.0 lb

## 2021-01-06 DIAGNOSIS — R3911 Hesitancy of micturition: Secondary | ICD-10-CM | POA: Diagnosis not present

## 2021-01-06 DIAGNOSIS — R350 Frequency of micturition: Secondary | ICD-10-CM

## 2021-01-06 DIAGNOSIS — Z86010 Personal history of colonic polyps: Secondary | ICD-10-CM | POA: Diagnosis not present

## 2021-01-06 DIAGNOSIS — T50905D Adverse effect of unspecified drugs, medicaments and biological substances, subsequent encounter: Secondary | ICD-10-CM

## 2021-01-06 DIAGNOSIS — R7303 Prediabetes: Secondary | ICD-10-CM | POA: Diagnosis not present

## 2021-01-06 DIAGNOSIS — G5793 Unspecified mononeuropathy of bilateral lower limbs: Secondary | ICD-10-CM

## 2021-01-06 MED ORDER — GABAPENTIN 100 MG PO CAPS: 100.0000 mg | ORAL_CAPSULE | Freq: Three times a day (TID) | ORAL | 3 refills | Status: DC

## 2021-01-06 NOTE — Progress Notes (Signed)
OFFICE VISIT  01/06/2021  CC:  Chief Complaint  Patient presents with   Peripheral Neuropathy    Pt states rx has began to help some;    HPI:    Patient is a 70 y.o. Caucasian male who presents for 1 mo f/u LE neuropathic pain. A/P as of last visit: "1) Prediabetes: intol of metformin but seeming to gradually get better. Unsure how much he's been taking this med but sounds like most of the last few months. Rechecking a1c today, plan on switching to Metformin xr IF he continues on metformin at all. If a1c improves he'll d/c med.   2) neuropathic pain bilat feet. Regarding his chronic/longstanding feet neuropathic pain, I don't think it is related to prediabetic state.  Sounds like gradual progression of sx's. We decided on a trial of gabapentin low dose: 100mg  tid.  Therapeutic expectations and side effect profile of medication discussed today.  Patient's questions answered. He declined neurology referral today.   3) HLD: goal LDL <70, was 47 six mo ago. FLP and hepatic panel today."  INTERIM HX: Labs last visit excellent: a1c was 5.7%, LDL 37.   Plan was for him to d/c metformin.  Feels like burning sx's are reduced the last few days, but didn't feel imp prior to last week. No side effect.  He hasn't stopped metformin yet but still has signif GI upset related to this med. He has made some signif dietary changes--lower complex carb intake.  From time to time he has a bit of slow/impaired urine stream and hurts some to get it out (deep in suprapubic region/prostate). Was consistently bothering him for a couple weeks.  Doing signif better for the last week or so. Describes long hx of daytime urgency and frequency, sounds like waxes and wanes in intensity. He minimizes caffeine and alcohol intake.  ROS: no fevers, no abd pain, no flank pain. No LE swelling, no wt loss or unusual wt gain, no focal weakness, no CP or SOB.     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   Cataract    Bilat: has lens implants bilat.   Clotting disorder ([place]) 2004   Colon polyp 09/19/2010   adenomas, recall 3 yrs.   Depression    Family history of colon cancer    Brother: in his 81s.   Hemorrhoids    History of fusion of cervical spine    History of left lateral epicondylitis    History of retinal detachment    OU   Migraines    Myocardial infarct ([place])    2004, when pt's stent clotted.  He got CABG at [facility] at that time.   Prediabetes    04/2019-->HbA1c 5.9% (unchanged compared to 2/3 yrs prior). 5.8% 11/2019  6.2% 05/2020. A1c 5.7% 11/2020.   Pseudophakia    RBBB     Past Surgical History:  Procedure Laterality Date   CARDIOVASCULAR STRESS TEST  2013   No ischemia.  EF 70%.   CATARACT EXTRACTION, BILATERAL     with lens implants   CERVICAL FUSION  2006   C5/6   CHOLECYSTECTOMY  2004   COLONOSCOPY  09/19/2010; 11/02/13; 04/28/18   2012: repeat 3 yrs, benign adanomas.  2015 same.  2019 same.  Recall 3 yrs.   CORONARY ARTERY BYPASS GRAFT  2004   CABGx4   CORONARY STENT PLACEMENT     BMS in 1990s.   POLYPECTOMY     RETINAL DETACHMENT SURGERY N/A    2011 & 2013 both eyes affected   TONSILLECTOMY  1956    Outpatient Medications Prior to Visit  Medication Sig Dispense Refill   aspirin EC 81 MG tablet Take 81 mg by mouth daily.     atorvastatin (LIPITOR) 40 MG tablet Take 1 tablet (40 mg total) by mouth daily. 90 tablet 3   Cholecalciferol (VITAMIN D PO) Take 1 capsule by mouth daily.     Coenzyme Q10 (COQ-10) 200 MG CAPS Take 1 capsule by mouth daily.     hydrocortisone (ANUSOL-HC) 2.5 % rectal cream Place 1 application rectally 2 (two) times daily. 30 g 3   gabapentin (NEURONTIN) 100 MG capsule Take 1 capsule (100 mg total) by mouth 3 (three) times daily. 90 capsule 1    metFORMIN (GLUCOPHAGE) 500 MG tablet Take 1 tablet (500 mg total) by mouth daily with supper. 30 tablet 5   No facility-administered medications prior to visit.    Allergies  Allergen Reactions   Penicillins Anaphylaxis    ROS As per HPI  PE: Vitals with BMI 01/06/2021 12/09/2020 06/10/2020  Height 6\' 0"  6\' 0"  6\' 0"   Weight 212 lbs 214 lbs 218 lbs 13 oz  BMI 28.75 81.82 99.37  Systolic 169 96 678  Diastolic 73 61 67  Pulse 73 63 68   Gen: Alert, well appearing.  Patient is oriented to person, place, time, and situation. AFFECT: pleasant, lucid thought and speech. CV: RRR, no m/r/g.   LUNGS: CTA bilat, nonlabored resps, good aeration in all lung fields. EXT: no clubbing or cyanosis.  no edema.    LABS:  Lab Results  Component Value Date   TSH 3.17 06/10/2020   Lab Results  Component Value Date   WBC 6.8 06/10/2020   HGB 15.7 06/10/2020   HCT 45.7 06/10/2020   MCV 84.8 06/10/2020   PLT 236 06/10/2020   Lab Results  Component Value Date   CREATININE 1.00 12/09/2020   BUN 17 12/09/2020   NA 142 12/09/2020   K 4.2 12/09/2020   CL 103 12/09/2020   CO2 25 12/09/2020   Lab Results  Component Value Date   ALT 23 12/09/2020   AST 23 12/09/2020   ALKPHOS 77 02/22/2018   BILITOT 1.3 (H) 12/09/2020   Lab Results  Component Value Date   CHOL 98 12/09/2020   Lab Results  Component Value Date   HDL 42 12/09/2020   Lab Results  Component Value Date   LDLCALC 37 12/09/2020   Lab Results  Component Value Date   TRIG 103 12/09/2020   Lab Results  Component Value Date   CHOLHDL 2.3 12/09/2020   Lab Results  Component Value Date   PSA 1.51 06/10/2020   PSA 1.46 05/19/2019   PSA 1.18 05/20/2018   Lab Results  Component Value Date   HGBA1C 5.7 (H) 12/09/2020   IMPRESSION AND PLAN:  1) Chronic neuropathic pain bilat feet, unknown etiology. Sounds like some response to low dose gabapentin lately.   He is not in favor of trying to titrate his dose up  some at this time. Cont 100mg  tid, [facility] rx [facility].  2) Prediabetes, GI upset/diarrhea d/t metformin.  He has been pushing through this side effect b/c over time it improved some  but given great a1c 1 mo ago I've recommended he stop this med, at least for now.  Recheck a1c 57mo.  3) OAB/BPH: I think he has a combination of both. Watchful waiting/obs at this time.  4) Hx of adenomatous colon polyp: due for rpt colonoscopy this year->gave pt Dr. Vena Rua office contact # so he could set this up.  An After Visit Summary was printed and given to the patient.  FOLLOW UP: Return in about 6 months (around 07/08/2021) for annual CPE (fasting).  Signed:  Crissie Sickles, MD           01/06/2021

## 2021-01-06 NOTE — Patient Instructions (Signed)
Contact Gastroenterologist to schedule colonoscopy:  [phone]  (Dr. Hilarie Fredrickson)

## 2021-01-21 ENCOUNTER — Encounter: Payer: Self-pay | Admitting: Internal Medicine

## 2021-01-23 ENCOUNTER — Encounter: Payer: Self-pay | Admitting: Family Medicine

## 2021-01-24 ENCOUNTER — Other Ambulatory Visit: Payer: Self-pay

## 2021-01-24 MED ORDER — ATORVASTATIN CALCIUM 40 MG PO TABS: 40.0000 mg | ORAL_TABLET | Freq: Every day | ORAL | 1 refills | Status: DC

## 2021-01-25 ENCOUNTER — Other Ambulatory Visit: Payer: Self-pay | Admitting: Family Medicine

## 2021-03-14 ENCOUNTER — Encounter: Payer: Self-pay | Admitting: Cardiovascular Disease

## 2021-03-14 ENCOUNTER — Ambulatory Visit: Payer: Medicare HMO | Admitting: Cardiovascular Disease

## 2021-03-14 ENCOUNTER — Other Ambulatory Visit: Payer: Self-pay

## 2021-03-14 DIAGNOSIS — I2510 Atherosclerotic heart disease of native coronary artery without angina pectoris: Secondary | ICD-10-CM

## 2021-03-14 DIAGNOSIS — I4510 Unspecified right bundle-branch block: Secondary | ICD-10-CM | POA: Diagnosis not present

## 2021-03-14 DIAGNOSIS — E782 Mixed hyperlipidemia: Secondary | ICD-10-CM | POA: Diagnosis not present

## 2021-03-14 NOTE — Assessment & Plan Note (Signed)
History of hyperlipidemia on statin therapy with lipid profile performed 12/09/2020 revealing total cholesterol 98, LDL 37 and HDL 42.

## 2021-03-14 NOTE — Patient Instructions (Signed)

## 2021-03-14 NOTE — Progress Notes (Signed)
03/14/2021 Gavin [name]   19-May-1951  [id number]  Primary Physician McGowen, Adrian Blackwater, MD Primary Cardiologist: Lorretta Harp MD Gavin [name], [place]  HPI:  Gavin [name] is a 70 y.o.  mildly overweight married Caucasian male father 2 children their 2s referred by Dr. Anitra Lauth to be established in my practice for ongoing care because of known disease.  He is a retired Solicitor.  I last saw him in the office 12/13/2019.  His risk factors include treated hyperlipidemia as well as family history with a brother who had a myocardial infarction.  He had a stent placed in [place] work on his LAD in 1994.  He had coronary artery bypass grafting x4 in 2004 at [facility] after myocardial infarction.  He had a stress test along the way most recently at [facility] in 2013 which was nonischemic with an EF of 70%.     Prior to COVID-19 and the hot weather he was bicycling fairly frequently without symptoms.  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  He currently is flying gliders.   Since I saw him virtually a year ago he continues to do well.  He is still active.  He denies chest pain or shortness of breath..   Current Meds  Medication Sig   aspirin EC 81 MG tablet Take 81 mg by mouth daily.   atorvastatin (LIPITOR) 40 MG tablet Take 1 tablet (40 mg total) by mouth daily.   Cholecalciferol (VITAMIN D PO) Take 1 capsule by mouth daily.   Coenzyme Q10 (COQ-10) 200 MG CAPS Take 1 capsule by mouth daily.   gabapentin (NEURONTIN) 100 MG capsule Take 1 capsule (100 mg total) by mouth 3 (three) times daily.   hydrocortisone (ANUSOL-HC) 2.5 % rectal cream Place 1 application rectally 2 (two) times daily. (Patient taking differently: Place 1 application rectally as needed.)     Allergies  Allergen Reactions   Penicillins Anaphylaxis    Social History   Socioeconomic History   Marital  status: Married    Spouse name: Not on file   Number of children: Not on file   Years of education: Not on file   Highest education level: Not on file  Occupational History   Not on file  Tobacco Use   Smoking status: Never   Smokeless tobacco: Never  Vaping Use   Vaping Use: Never used  Substance and Sexual Activity   Alcohol use: Yes    Alcohol/week: 5.0 standard drinks    Types: 5 Glasses of wine per week    Comment: 1 glass a wine a day    Drug use: Never   Sexual activity: Yes  Other Topics Concern   Not on file  Social History Narrative   Married, 2 grown children.  No grandchildren.   [name] Degree: Marketing executive.   Gavin [name]; retired Chief Financial Officer.   Alc: 2 drinks per week.   No tob.   Social Determinants of Health   Financial Resource Strain: Low Risk    Difficulty of Paying Living Expenses: Not hard at all  Food Insecurity: No Food Insecurity   Worried About Charity fundraiser in the Last Year: Never true   [place] in the Last Year: Never true  Transportation Needs: No Transportation Needs   Lack of Transportation (Medical): No   Lack of Transportation (Non-Medical): No  Physical Activity: Sufficiently  Active   Days of Exercise per Week: 5 days   Minutes of Exercise per Session: 40 min  Stress: No Stress Concern Present   Feeling of Stress : Not at all  Social Connections: Moderately Isolated   Frequency of Communication with Friends and Family: More than three times a week   Frequency of Social Gatherings with Friends and Family: Never   Attends Religious Services: Never   Marine scientist or Organizations: No   Attends Music therapist: Never   Marital Status: Married  Human resources officer Violence: Not At Risk   Fear of Current or Ex-Partner: No   Emotionally Abused: No   Physically Abused: No   Sexually Abused: No     Review of Systems: General: negative for chills, fever, night sweats or weight changes.  Cardiovascular: negative  for chest pain, dyspnea on exertion, edema, orthopnea, palpitations, paroxysmal nocturnal dyspnea or shortness of breath Dermatological: negative for rash Respiratory: negative for cough or wheezing Urologic: negative for hematuria Abdominal: negative for nausea, vomiting, diarrhea, bright red blood per rectum, melena, or hematemesis Neurologic: negative for visual changes, syncope, or dizziness All other systems reviewed and are otherwise negative except as noted above.    Blood pressure 116/78, pulse 64, height 6' 0.5" (1.842 m), weight 217 lb (98.4 kg), SpO2 96 %.  General appearance: alert and no distress Neck: no adenopathy, no carotid bruit, no JVD, supple, symmetrical, trachea midline, and thyroid not enlarged, symmetric, no tenderness/mass/nodules Lungs: clear to auscultation bilaterally Heart: regular rate and rhythm, S1, S2 normal, no murmur, click, rub or gallop Extremities: extremities normal, atraumatic, no cyanosis or edema Pulses: 2+ and symmetric Skin: Skin color, texture, turgor normal. No rashes or lesions Neurologic: Grossly normal  EKG sinus rhythm at 64 with right bundle branch block.  I personally reviewed this EKG.  ASSESSMENT AND PLAN:   Hyperlipidemia History of hyperlipidemia on statin therapy with lipid profile performed 12/09/2020 revealing total cholesterol 98, LDL 37 and HDL 42.  Coronary artery disease History of CAD status post LAD stenting in [place] in 1994.  He had CABG x4 at [facility] after myocardial infarction in 2004 and negative stress test along the way.  He denies chest pain or shortness of breath.  Right bundle branch block Chronic     Lorretta Harp MD [facility], [facility] 03/14/2021 11:03 AM

## 2021-03-14 NOTE — Assessment & Plan Note (Signed)
History of CAD status post LAD stenting in [place] in 1994.  He had CABG x4 at [facility] after myocardial infarction in 2004 and negative stress test along the way.  He denies chest pain or shortness of breath.

## 2021-03-14 NOTE — Assessment & Plan Note (Signed)
Chronic. 

## 2021-03-31 ENCOUNTER — Encounter: Payer: Self-pay | Admitting: Family Medicine

## 2021-04-01 ENCOUNTER — Telehealth (INDEPENDENT_AMBULATORY_CARE_PROVIDER_SITE_OTHER): Payer: Medicare HMO | Admitting: Family Medicine

## 2021-04-01 ENCOUNTER — Encounter: Payer: Self-pay | Admitting: Family Medicine

## 2021-04-01 DIAGNOSIS — U071 COVID-19: Secondary | ICD-10-CM | POA: Diagnosis not present

## 2021-04-01 MED ORDER — NIRMATRELVIR/RITONAVIR (PAXLOVID)TABLET: 3.0000 | ORAL_TABLET | Freq: Two times a day (BID) | ORAL | 0 refills | Status: AC

## 2021-04-01 NOTE — Progress Notes (Signed)
Virtual Visit via Video Note  I connected with Gavin [name]  on 04/01/21 at 11:40 AM EDT by a video enabled telemedicine application and verified that I am speaking with the correct person using two identifiers.  Location patient: home, [place] Location provider:work or home office Persons participating in the virtual visit: patient, provider, patient's wife  I discussed the limitations of evaluation and management by telemedicine and the availability of in person appointments. The patient expressed understanding and agreed to proceed.   HPI:  Acute telemedicine visit for Covid19: -Onset: yesterday; tested positive for covid19 -Symptoms include: nasal congestion, sore throat, cough, low grade fever -wife has covid -Denies:CP, SOB, NVD, inability to eat/drink/get out of bed -Pertinent past medical history:see below -Pertinent medication allergies:  Allergies  Allergen Reactions   Penicillins Anaphylaxis  -COVID-19 vaccine status: double vaccinated and had 2 booster -had labs in May - calculated egfr for age and cr is 15  ROS: See pertinent positives and negatives per HPI.  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   Cataract    Bilat: has lens implants bilat.   Clotting disorder ([place]) 2004   Colon polyp 09/19/2010   adenomas, recall 3 yrs.   Depression    Family history of colon cancer    Brother: in his 30s.   Hemorrhoids    History of fusion of cervical spine    History of left lateral epicondylitis    History of retinal detachment    OU   Migraines    Myocardial infarct ([place])    2004, when pt's stent clotted.  He got CABG at [facility] at that time.   Prediabetes    04/2019-->HbA1c 5.9% (unchanged compared to 2/3 yrs prior). 5.8% 11/2019  6.2% 05/2020. A1c 5.7% 11/2020.   Pseudophakia    RBBB      Past Surgical History:  Procedure Laterality Date   CARDIOVASCULAR STRESS TEST  2013   No ischemia.  EF 70%.   CATARACT EXTRACTION, BILATERAL     with lens implants   CERVICAL FUSION  2006   C5/6   CHOLECYSTECTOMY  2004   COLONOSCOPY  09/19/2010; 11/02/13; 04/28/18   2012: repeat 3 yrs, benign adanomas.  2015 same.  2019 same.  Recall 3 yrs.   CORONARY ARTERY BYPASS GRAFT  2004   CABGx4   CORONARY STENT PLACEMENT     BMS in 1990s.   POLYPECTOMY     RETINAL DETACHMENT SURGERY N/A    2011 & 2013 both eyes affected   TONSILLECTOMY  1956     Current Outpatient Medications:    aspirin EC 81 MG tablet, Take 81 mg by mouth daily., Disp: , Rfl:    atorvastatin (LIPITOR) 40 MG tablet, Take 1 tablet (40 mg total) by mouth daily., Disp: 90 tablet, Rfl: 1   Cholecalciferol (VITAMIN D PO), Take 1 capsule by mouth daily., Disp: , Rfl:    Coenzyme Q10 (COQ10 PO), Take 300 mg by mouth daily., Disp: , Rfl:    gabapentin (NEURONTIN) 100 MG capsule, Take 1 capsule (100 mg total) by mouth 3 (three) times daily., Disp: 270 capsule, Rfl: 3   hydrocortisone (ANUSOL-HC) 2.5 % rectal cream, Place 1 application rectally 2 (two) times daily. (Patient taking differently: Place 1 application rectally as  needed.), Disp: 30 g, Rfl: 3   nirmatrelvir/ritonavir EUA (PAXLOVID) 20 x 150 MG & 10 x 100MG TABS, Take 3 tablets by mouth 2 (two) times daily for 5 days. (Take nirmatrelvir 150 mg two tablets twice daily for 5 days and ritonavir 100 mg one tablet twice daily for 5 days) Patient GFR is > 60 on recent labs in [facility], Disp: 30 tablet, Rfl: 0  EXAM:  VITALS per patient if applicable:  GENERAL: alert, oriented, appears well and in no acute distress  HEENT: atraumatic, conjunttiva clear, no obvious abnormalities on inspection of external nose and ears  NECK: normal movements of the head and neck  LUNGS: on inspection no signs of respiratory distress, breathing rate appears normal, no obvious gross SOB,  gasping or wheezing  CV: no obvious cyanosis  MS: moves all visible extremities without noticeable abnormality  PSYCH/NEURO: pleasant and cooperative, no obvious depression or anxiety, speech and thought processing grossly intact  ASSESSMENT AND PLAN:  Discussed the following assessment and plan:  COVID-19   Discussed treatment options (infusions and oral options and risk of drug interactions), ideal treatment window, potential complications, isolation and precautions for COVID-19.  Discussed possibility of rebound with antivirals and the need to reisolate if it should occur for 5 days. Checked for/reviewed any labs done with GFR listed in HPI if available. After lengthy discussion, the patient opted for treatment with Paxlovid due to being higher risk for complications of covid or severe disease and other factors. Discussed EUA status of this drug and the fact that there is preliminary limited knowledge of risks/interactions/side effects per EUA document vs possible benefits and precautions. This information was shared with patient during the visit and also was provided in patient instructions. Also, advised that patient discuss risks/interactions and use with pharmacist/treatment team as well. Other symptomatic care measures summarized in patient instructions. Advised to seek prompt in person care if worsening, new symptoms arise, or if is not improving with treatment. Discussed options for inperson care if PCP office not available. Did let this patient know that I only do telemedicine on Tuesdays and Thursdays for [name]. Advised to schedule follow up visit with PCP or [facility] if any further questions or concerns to avoid delays in care.   I discussed the assessment and treatment plan with the patient. The patient was provided an opportunity to ask questions and all were answered. The patient agreed with the plan and demonstrated an understanding of the instructions.     Lucretia Kern, DO

## 2021-04-01 NOTE — Patient Instructions (Addendum)
HOME CARE TIPS:  -[place] testing information: [email or web address] OR [phone] Most pharmacies also offer testing and home test kits. If the Covid19 test is positive, please make a prompt follow up visit with your primary care office or with [facility] to discuss treatment options. Treatments for Covid19 are best given early in the course of the illness.   -I sent the medication(s) we discussed to your pharmacy: Meds ordered this encounter  Medications   nirmatrelvir/ritonavir EUA (PAXLOVID) 20 x 150 MG & 10 x $Re'100MG'HJG$  TABS    Sig: Take 3 tablets by mouth 2 (two) times daily for 5 days. (Take nirmatrelvir 150 mg two tablets twice daily for 5 days and ritonavir 100 mg one tablet twice daily for 5 days) Patient GFR is > 60 on recent labs in [facility]    Dispense:  30 tablet    Refill:  0     -I sent in the [place] treatment or referral you requested per our discussion. Please see the information provided below and discuss further with the pharmacist/treatment team.  -If taking Paxlovid, please review all medications, supplement and over the counter drugs with your pharmacist and ask them to check for any interactions. Please make the following changes to your regular medications while taking Paxlovid: -STOP your cholesterol medication (lipitor/atorvastatin) while taking the Paxlovid and restart it about 3 days after finishing treatment  -If taking Paxlovid, there is a chance of rebound illness after finishing your treatment. If you become sick again please isolate for an additional 5 days.    -can use tylenol if needed for fevers, aches and pains per instructions  -can use nasal saline a few times per day if you have nasal congestion  -stay hydrated, drink plenty of fluids and eat small healthy meals - avoid dairy  -can take 1000 IU (30mcg) Vit D3 and 100-500 mg of Vit C daily per instructions  -If the Covid test is positive, check out the  [facility] website for more information on home care, transmission and treatment for COVID19  -follow up with your doctor in 2-3 days unless improving and feeling better  -stay home while sick, except to seek medical care. If you have COVID19, ideally it would be best to stay home for a full 10 days since the onset of symptoms PLUS one day of no fever and feeling better. Wear a good mask that fits snugly (such as N95 or KN95) if around others to reduce the risk of transmission.  It was nice to meet you today, and I really hope you are feeling better soon. I help [facility] out with telemedicine visits on Tuesdays and Thursdays and am available for visits on those days. If you have any concerns or questions following this visit please schedule a follow up visit with your Primary Care doctor or seek care at a local urgent care clinic to avoid delays in care.    Seek in person care or schedule a follow up video visit promptly if your symptoms worsen, new concerns arise or you are not improving with treatment. Call 911 and/or seek emergency care if your symptoms are severe or life threatening.  FACT SHEET FOR PATIENTS, PARENTS, AND CAREGIVERS EMERGENCY USE AUTHORIZATION (EUA) OF PAXLOVID FOR CORONAVIRUS DISEASE 2019 (COVID-19) You are being given this Fact Sheet because your healthcare provider believes it is necessary to provide you with PAXLOVID for the treatment of mild-to-moderate coronavirus disease (COVID-19) caused by the SARS-CoV-2 virus. This Fact Sheet contains information to help you understand the  risks and benefits of taking the PAXLOVID you have received or may receive. The U.S. Food and Drug Administration (FDA) has issued an Emergency Use Authorization (EUA) to make PAXLOVID available during the COVID-19 pandemic (for more details about an EUA please see "What is an Emergency Use Authorization?" at the end of this document). PAXLOVID is not an FDA-approved medicine in the [place].  Read this Fact Sheet for information about PAXLOVID. Talk to your healthcare provider about your options or if you have any questions. It is your choice to take PAXLOVID.  What is COVID-19? COVID-19 is caused by a virus called a coronavirus. You can get COVID-19 through close contact with another person who has the virus. COVID-19 illnesses have ranged from very mild-to-severe, including illness resulting in death. While information so far suggests that most COVID-19 illness is mild, serious illness can happen and may cause some of your other medical conditions to become worse. Older people and people of all ages with severe, long lasting (chronic) medical conditions like heart disease, lung disease, and diabetes, for example seem to be at higher risk of being hospitalized for COVID-19.  What is PAXLOVID? PAXLOVID is an investigational medicine used to treat mild-to-moderate COVID-19 in adults and children [30 years of age and older weighing at least 49 pounds (30 kg)] with positive results of direct SARS-CoV-2 viral testing, and who are at high risk for progression to severe COVID-19, including hospitalization or death. PAXLOVID is investigational because it is still being studied. There is limited information about the safety and effectiveness of using PAXLOVID to treat people with mild-to-moderate COVID-19.  The FDA has authorized the emergency use of PAXLOVID for the treatment of mild-tomoderate COVID-19 in adults and children [67 years of age and older weighing at least 5 pounds (54 kg)] with a positive test for the virus that causes COVID-19, and who are at high risk for progression to severe COVID-19, including hospitalization or death, under an EUA. 1 Revised: 04 October 2020   What should I tell my healthcare provider before I take PAXLOVID? Tell your healthcare provider if you: ? Have any allergies ? Have liver or kidney disease ? Are pregnant or plan to become pregnant ?  Are breastfeeding a child ? Have any serious illnesses  Tell your healthcare provider about all the medicines you take, including prescription and over-the-counter medicines, vitamins, and herbal supplements. Some medicines may interact with PAXLOVID and may cause serious side effects. Keep a list of your medicines to show your healthcare provider and pharmacist when you get a new medicine.  You can ask your healthcare provider or pharmacist for a list of medicines that interact with PAXLOVID. Do not start taking a new medicine without telling your healthcare provider. Your healthcare provider can tell you if it is safe to take PAXLOVID with other medicines.  Tell your healthcare provider if you are taking combined hormonal contraceptive. PAXLOVID may affect how your birth control pills work. Females who are able to become pregnant should use another effective alternative form of contraception or an additional barrier method of contraception. Talk to your healthcare provider if you have any questions about contraceptive methods that might be right for you.  How do I take PAXLOVID? ? PAXLOVID consists of 2 medicines: nirmatrelvir and ritonavir. o Take 2 pink tablets of nirmatrelvir with 1 white tablet of ritonavir by mouth 2 times each day (in the morning and in the evening) for 5 days. For each dose, take all 3 tablets  at the same time. o If you have kidney disease, talk to your healthcare provider. You may need a different dose. ? Swallow the tablets whole. Do not chew, break, or crush the tablets. ? Take PAXLOVID with or without food. ? Do not stop taking PAXLOVID without talking to your healthcare provider, even if you feel better. ? If you miss a dose of PAXLOVID within 8 hours of the time it is usually taken, take it as soon as you remember. If you miss a dose by more than 8 hours, skip the missed dose and take the next dose at your regular time. Do not take 2 doses of PAXLOVID  at the same time. ? If you take too much PAXLOVID, call your healthcare provider or go to the nearest hospital emergency room right away. ? If you are taking a ritonavir- or cobicistat-containing medicine to treat hepatitis C or Human Immunodeficiency Virus (HIV), you should continue to take your medicine as prescribed by your healthcare provider. 2 Revised: 04 October 2020    Talk to your healthcare provider if you do not feel better or if you feel worse after 5 days.  Who should generally not take PAXLOVID? Do not take PAXLOVID if: ? You are allergic to nirmatrelvir, ritonavir, or any of the ingredients in PAXLOVID. ? You are taking any of the following medicines: o Alfuzosin o Pethidine, propoxyphene o Ranolazine o Amiodarone, dronedarone, flecainide, propafenone, quinidine o Colchicine o Lurasidone, pimozide, clozapine o Dihydroergotamine, ergotamine, methylergonovine o Lovastatin, simvastatin o Sildenafil (Revatio) for pulmonary arterial hypertension (PAH) o Triazolam, oral midazolam o Apalutamide o Carbamazepine, phenobarbital, phenytoin o Rifampin o St. John's Wort (hypericum perforatum) Taking PAXLOVID with these medicines may cause serious or life-threatening side effects or affect how PAXLOVID works.  These are not the only medicines that may cause serious side effects if taken with PAXLOVID. PAXLOVID may increase or decrease the levels of multiple other medicines. It is very important to tell your healthcare provider about all of the medicines you are taking because additional laboratory tests or changes in the dose of your other medicines may be necessary while you are taking PAXLOVID. Your healthcare provider may also tell you about specific symptoms to watch out for that may indicate that you need to stop or decrease the dose of some of your other medicines.  What are the important possible side effects of PAXLOVID? Possible side effects of PAXLOVID are: ?  Allergic Reactions. Allergic reactions can happen in people taking PAXLOVID, even after only 1 dose. Stop taking PAXLOVID and call your healthcare provider right away if you get any of the following symptoms of an allergic reaction: o hives o trouble swallowing or breathing o swelling of the mouth, lips, or face o throat tightness o hoarseness 3 Revised: 04 October 2020  o skin rash ? Liver Problems. Tell your healthcare provider right away if you have any of these signs and symptoms of liver problems: loss of appetite, yellowing of your skin and the whites of eyes (jaundice), dark-colored urine, pale colored stools and itchy skin, stomach area (abdominal) pain. ? Resistance to HIV Medicines. If you have untreated HIV infection, PAXLOVID may lead to some HIV medicines not working as well in the future. ? Other possible side effects include: o altered sense of taste o diarrhea o high blood pressure o muscle aches These are not all the possible side effects of PAXLOVID. Not many people have taken PAXLOVID. Serious and unexpected side effects may happen. PAXLOVID is  still being studied, so it is possible that all of the risks are not known at this time.  What other treatment choices are there? Veklury (remdesivir) is FDA-approved for the treatment of mild-to-moderate QJJHE-17 in certain adults and children. Talk with your doctor to see if Marijean Heath is appropriate for you. Like PAXLOVID, FDA may also allow for the emergency use of other medicines to treat people with COVID-19. Go to https://price.info/ for information on the emergency use of other medicines that are authorized by FDA to treat people with COVID-19. Your healthcare provider may talk with you about clinical trials for which you may be eligible. It is your choice to be treated or not to be treated with PAXLOVID. Should you  decide not to receive it or for your child not to receive it, it will not change your standard medical care.  What if I am pregnant or breastfeeding? There is no experience treating pregnant women or breastfeeding mothers with PAXLOVID. For a mother and unborn baby, the benefit of taking PAXLOVID may be greater than the risk from the treatment. If you are pregnant, discuss your options and specific situation with your healthcare provider. It is recommended that you use effective barrier contraception or do not have sexual activity while taking PAXLOVID. If you are breastfeeding, discuss your options and specific situation with your healthcare provider. 4 Revised: 04 October 2020   How do I report side effects with PAXLOVID? Contact your healthcare provider if you have any side effects that bother you or do not go away. Report side effects to FDA MedWatch at SmoothHits.hu or call 1-800-FDA1088 or you can report side effects to [employer]. at the contact information provided below. Website Fax number Telephone number www.pfizersafetyreporting.com [phone] How should I store [place]? Store PAXLOVID tablets at room temperature, between 68?F to 77?F (20?C to 25?C). How can I learn more about COVID-19? ? Ask your healthcare provider. ? Visit https://jacobson-johnson.[email or web address] ? Contact your local or state public health department. What is an Emergency Use Authorization (EUA)? The [place] FDA has made PAXLOVID available under an emergency access mechanism called an Emergency Use Authorization (EUA). The EUA is supported by a Education officer, museum and Human Service (HHS) declaration that circumstances exist to justify the emergency use of drugs and biological products during the COVID-19 pandemic. PAXLOVID for the treatment of mild-to-moderate COVID-19 in adults and children [79 years of age and older weighing at least 67 pounds (71 kg)] with positive results  of direct SARS-CoV-2 viral testing, and who are at high risk for progression to severe COVID-19, including hospitalization or death, has not undergone the same type of review as an FDA-approved product. In issuing an EUA under the HYIFO-27 public health emergency, the FDA has determined, among other things, that based on the total amount of scientific evidence available including data from adequate and well-controlled clinical trials, if available, it is reasonable to believe that the product may be effective for diagnosing, treating, or preventing COVID-19, or a serious or life-threatening disease or condition caused by COVID-19; that the known and potential benefits of the product, when used to diagnose, treat, or prevent such disease or condition, outweigh the known and potential risks of such product; and that there are no adequate, approved, and available alternatives. All of these criteria must be met to allow for the product to be used in the treatment of patients during the COVID-19 pandemic. The EUA for PAXLOVID is in effect for the duration of the COVID-19 declaration  justifying emergency use of this product, unless terminated or revoked (after which the products may no longer be used under the EUA). 5 Revised: 04 October 2020     Additional Information For general questions, visit the website or call the telephone number provided below. Website Telephone number www.COVID19oralRx.com [phone] (1-877-C19-PACK) You can also go to www.pfizermedinfo.com or call [phone] for more information. YQM-2500-3.7 Revised: 04 October 2020

## 2021-04-03 ENCOUNTER — Encounter: Payer: Self-pay | Admitting: Family Medicine

## 2021-04-03 NOTE — Telephone Encounter (Signed)
Nothing further needed. Please see message below

## 2021-04-03 NOTE — Telephone Encounter (Signed)
Pt has been advised of PCP recommendations.

## 2021-04-03 NOTE — Telephone Encounter (Signed)
Patient is calling in stating he tested positive on Monday for COVID, declined a RX for cough medicine when doing a virtual with Dr.Kim. Patient states that his cough has been worse and would like to get a prescription sent in. Gavin [name] has a follow up on 04/15/21.

## 2021-04-03 NOTE — Telephone Encounter (Signed)
Not appropriate to do rx cough med w/out o/v to determine cause/need. If he has not already tried otc mucinex dm then I recommend this.

## 2021-04-05 DIAGNOSIS — U071 COVID-19: Secondary | ICD-10-CM

## 2021-04-05 HISTORY — DX: COVID-19: U07.1

## 2021-04-15 ENCOUNTER — Telehealth: Payer: Medicare HMO | Admitting: Family Medicine

## 2021-04-25 ENCOUNTER — Other Ambulatory Visit: Payer: Self-pay

## 2021-04-25 ENCOUNTER — Encounter: Payer: Self-pay | Admitting: Internal Medicine

## 2021-04-25 ENCOUNTER — Ambulatory Visit (AMBULATORY_SURGERY_CENTER): Payer: Medicare HMO

## 2021-04-25 VITALS — Ht 72.5 in | Wt 215.0 lb

## 2021-04-25 DIAGNOSIS — Z86010 Personal history of colonic polyps: Secondary | ICD-10-CM

## 2021-04-25 DIAGNOSIS — Z800 Family history of malignant neoplasm of digestive organs: Secondary | ICD-10-CM

## 2021-04-25 MED ORDER — PLENVU 140 G PO SOLR: 1.0000 | Freq: Once | ORAL | 0 refills | Status: AC

## 2021-04-25 NOTE — Progress Notes (Signed)
No egg or soy allergy known to patient  No issues known to pt with past sedation with any surgeries or procedures Patient denies ever being told they had issues or difficulty with intubation  No FH of Malignant Hyperthermia Pt is not on diet pills Pt is not on  home 02  Pt is not on blood thinners  Pt denies issues with constipation  No A fib or A flutter  Pt is fully vaccinated  for Covid   [place] medicare Coupon given to pt in [place] today , Code to Pharmacy and  NO PA's for preps discussed with pt In [facility] today  Discussed with pt there will be an out-of-pocket cost for prep and that varies from $0 to 70 +  dollars - pt verbalized understanding   Due to the COVID-19 pandemic we are asking patients to follow certain guidelines in [place] and the [place]   Pt aware of COVID protocols and [facility] guidelines

## 2021-05-09 ENCOUNTER — Other Ambulatory Visit: Payer: Self-pay

## 2021-05-09 ENCOUNTER — Ambulatory Visit (AMBULATORY_SURGERY_CENTER): Payer: Medicare HMO | Admitting: Internal Medicine

## 2021-05-09 ENCOUNTER — Encounter: Payer: Self-pay | Admitting: Internal Medicine

## 2021-05-09 VITALS — BP 112/72 | HR 65 | Temp 98.4°F | Resp 22 | Ht 72.0 in | Wt 215.0 lb

## 2021-05-09 DIAGNOSIS — K6289 Other specified diseases of anus and rectum: Secondary | ICD-10-CM | POA: Diagnosis not present

## 2021-05-09 DIAGNOSIS — D128 Benign neoplasm of rectum: Secondary | ICD-10-CM | POA: Diagnosis not present

## 2021-05-09 DIAGNOSIS — Z86010 Personal history of colonic polyps: Secondary | ICD-10-CM | POA: Diagnosis not present

## 2021-05-09 DIAGNOSIS — D123 Benign neoplasm of transverse colon: Secondary | ICD-10-CM | POA: Diagnosis not present

## 2021-05-09 DIAGNOSIS — R69 Illness, unspecified: Secondary | ICD-10-CM | POA: Diagnosis not present

## 2021-05-09 DIAGNOSIS — Z800 Family history of malignant neoplasm of digestive organs: Secondary | ICD-10-CM | POA: Diagnosis not present

## 2021-05-09 DIAGNOSIS — R7303 Prediabetes: Secondary | ICD-10-CM | POA: Diagnosis not present

## 2021-05-09 DIAGNOSIS — K621 Rectal polyp: Secondary | ICD-10-CM

## 2021-05-09 DIAGNOSIS — D124 Benign neoplasm of descending colon: Secondary | ICD-10-CM | POA: Diagnosis not present

## 2021-05-09 DIAGNOSIS — I2510 Atherosclerotic heart disease of native coronary artery without angina pectoris: Secondary | ICD-10-CM | POA: Diagnosis not present

## 2021-05-09 DIAGNOSIS — E785 Hyperlipidemia, unspecified: Secondary | ICD-10-CM | POA: Diagnosis not present

## 2021-05-09 MED ORDER — SODIUM CHLORIDE 0.9 % IV SOLN: 500.0000 mL | Freq: Once | INTRAVENOUS | Status: DC

## 2021-05-09 NOTE — Patient Instructions (Signed)
YOU HAD AN ENDOSCOPIC PROCEDURE TODAY AT [facility]:   Refer to the procedure report that was given to you for any specific questions about what was found during the examination.  If the procedure report does not answer your questions, please call your gastroenterologist to clarify.  If you requested that your care partner not be given the details of your procedure findings, then the procedure report has been included in a sealed envelope for you to review at your convenience later.  **handouts given on polyps and hemorrhoids**  YOU SHOULD EXPECT: Some feelings of bloating in the abdomen. Passage of more gas than usual.  Walking can help get rid of the air that was put into your GI tract during the procedure and reduce the bloating. If you had a lower endoscopy (such as a colonoscopy or flexible sigmoidoscopy) you may notice spotting of blood in your stool or on the toilet paper. If you underwent a bowel prep for your procedure, you may not have a normal bowel movement for a few days.  Please Note:  You might notice some irritation and congestion in your nose or some drainage.  This is from the oxygen used during your procedure.  There is no need for concern and it should clear up in a day or so.  SYMPTOMS TO REPORT IMMEDIATELY:  Following lower endoscopy (colonoscopy or flexible sigmoidoscopy):  Excessive amounts of blood in the stool  Significant tenderness or worsening of abdominal pains  Swelling of the abdomen that is new, acute  Fever of 100F or higher  For urgent or emergent issues, a gastroenterologist can be reached at any hour by calling [phone]. Do not use MyChart messaging for urgent concerns.    DIET:  We do recommend a small meal at first, but then you may proceed to your regular diet.  Drink plenty of fluids but you should avoid alcoholic beverages for 24 hours.  ACTIVITY:  You should plan to take it easy for the rest of today and you should NOT DRIVE or  use heavy machinery until tomorrow (because of the sedation medicines used during the test).    FOLLOW UP: Our staff will call the number listed on your records 48-72 hours following your procedure to check on you and address any questions or concerns that you may have regarding the information given to you following your procedure. If we do not reach you, we will leave a message.  We will attempt to reach you two times.  During this call, we will ask if you have developed any symptoms of COVID 19. If you develop any symptoms (ie: fever, flu-like symptoms, shortness of breath, cough etc.) before then, please call [phone].  If you test positive for Covid 19 in the 2 weeks post procedure, please call and report this information to [place].    If any biopsies were taken you will be contacted by phone or by letter within the next 1-3 weeks.  Please call us at [phone] if you have not heard about the biopsies in 3 weeks.    SIGNATURES/CONFIDENTIALITY: You and/or your care partner have signed paperwork which will be entered into your electronic medical record.  These signatures attest to the fact that that the information above on your After Visit Summary has been reviewed and is understood.  Full responsibility of the confidentiality of this discharge information lies with you and/or your care-partner.

## 2021-05-09 NOTE — Op Note (Signed)
[facility] Patient Name: Gavin [name] Procedure Date: 05/09/2021 9:38 AM MRN: [id number] Endoscopist: Jerene Bears , MD Age: 70 Referring MD:  Date of Birth: 1950/09/25 Gender: Male Account #: [id number] Procedure:                Colonoscopy Indications:              High risk colon cancer surveillance: Personal                            history of multiple adenomas and sessile serrated                            polyps (colonoscopy 2019, 2015, 2010), Family                            history of colon cancer in a first-degree relative                            before age 42 years (brother), Last colonoscopy:                            October 2019 Medicines:                Monitored Anesthesia Care Procedure:                Pre-Anesthesia Assessment:                           - Prior to the procedure, a History and Physical                            was performed, and patient medications and                            allergies were reviewed. The patient's tolerance of                            previous anesthesia was also reviewed. The risks                            and benefits of the procedure and the sedation                            options and risks were discussed with the patient.                            All questions were answered, and informed consent                            was obtained. Prior Anticoagulants: The patient has                            taken no previous anticoagulant or antiplatelet  agents. ASA Grade Assessment: III - A patient with                            severe systemic disease. After reviewing the risks                            and benefits, the patient was deemed in                            satisfactory condition to undergo the procedure.                           After obtaining informed consent, the colonoscope                            was passed under direct vision. Throughout the                             procedure, the patient's blood pressure, pulse, and                            oxygen saturations were monitored continuously. The                            CF [id number] #[id number] was introduced through the anus                            and advanced to the cecum, identified by                            appendiceal orifice and ileocecal valve. The                            colonoscopy was performed without difficulty. The                            patient tolerated the procedure well. The quality                            of the bowel preparation was good. The ileocecal                            valve, appendiceal orifice, and rectum were                            photographed. Scope In: 9:48:35 AM Scope Out: 10:10:22 AM Scope Withdrawal Time: 0 hours 18 minutes 6 seconds  Total Procedure Duration: 0 hours 21 minutes 47 seconds  Findings:                 Skin tags were found on perianal exam.                           A 1 mm polyp was found in the transverse colon. The  polyp was sessile. The polyp was removed with a                            cold biopsy forceps. Resection and retrieval were                            complete.                           A 5 mm polyp was found in the descending colon. The                            polyp was sessile. The polyp was removed with a                            cold snare. Resection and retrieval were complete.                           The sigmoid colon and descending colon were                            moderately redundant.                           A 6 mm polyp was found in the anus. The polyp was                            semi-pedunculated. This was biopsied with a cold                            forceps for histology (possible skin tag, but                            exclude AIN).                           Internal hemorrhoids were found during                            retroflexion. The  hemorrhoids were small. Complications:            No immediate complications. Estimated Blood Loss:     Estimated blood loss was minimal. Impression:               - One 1 mm polyp in the transverse colon, removed                            with a cold biopsy forceps. Resected and retrieved.                           - One 5 mm polyp in the descending colon, removed                            with a cold snare. Resected and retrieved.                           -  One 6 mm polyp at the anus. Biopsied.                           - Small internal hemorrhoids. Recommendation:           - Patient has a contact number available for                            emergencies. The signs and symptoms of potential                            delayed complications were discussed with the                            patient. Return to normal activities tomorrow.                            Written discharge instructions were provided to the                            patient.                           - Resume previous diet.                           - Continue present medications.                           - Await pathology results.                           - Repeat colonoscopy is recommended for                            surveillance. The colonoscopy date will be                            determined after pathology results from today's                            exam become available for review. Jerene Bears, MD 05/09/2021 10:16:11 AM This report has been signed electronically.

## 2021-05-09 NOTE — Progress Notes (Signed)
Sedate, gd SR, tolerated procedure well, VSS, report to RN 

## 2021-05-09 NOTE — Progress Notes (Signed)
GASTROENTEROLOGY PROCEDURE H&P NOTE   Primary Care Physician: Tammi Sou, MD    Reason for Procedure:  Surveillance of colon polyps and family history of colon cancer  Plan:    Colonoscopy  Patient is appropriate for endoscopic procedure(s) in the ambulatory ([facility]) setting.  The nature of the procedure, as well as the risks, benefits, and alternatives were carefully and thoroughly reviewed with the patient. Ample time for discussion and questions allowed. The patient understood, was satisfied, and agreed to proceed.     HPI: Gavin [name] is a 70 y.o. male who presents for surveillance colonoscopy.  Last colonoscopy 2019.  History of multiple sessile serrated polyps and adenomatous colon polyps.  Family history of colon cancer in his brother around age 64.  No complaints today including recent chest pain or shortness of breath.  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   Cataract    Bilat: has lens implants bilat.   Clotting disorder ([place]) 2004   Colon polyp 09/19/2010   adenomas, recall 3 yrs.   Depression    Family history of colon cancer    Brother: in his 19s.   Hemorrhoids    History of fusion of cervical spine    History of left lateral epicondylitis    History of retinal detachment    OU   Hyperlipidemia    Migraines    Myocardial infarct ([place])    2004, when pt's stent clotted.  He got CABG at [facility] at that time.   Prediabetes    04/2019-->HbA1c 5.9% (unchanged compared to 2/3 yrs prior). 5.8% 11/2019  6.2% 05/2020. A1c 5.7% 11/2020.   Pseudophakia    RBBB     Past Surgical History:  Procedure Laterality Date   CARDIOVASCULAR STRESS TEST  2013   No ischemia.  EF 70%.   CATARACT EXTRACTION, BILATERAL     with lens implants   CERVICAL  FUSION  2006   C5/6   CHOLECYSTECTOMY  2004   COLONOSCOPY  09/19/2010; 11/02/13; 04/28/18   2012: repeat 3 yrs, benign adanomas.  2015 same.  2019 same.  Recall 3 yrs.   CORONARY ARTERY BYPASS GRAFT  2004   CABGx4   CORONARY STENT PLACEMENT     BMS in 1990s.   POLYPECTOMY     RETINAL DETACHMENT SURGERY N/A    2011 & 2013 both eyes affected   TONSILLECTOMY  1956    Prior to Admission medications   Medication Sig Start Date End Date Taking? Authorizing Provider  aspirin EC 81 MG tablet Take 81 mg by mouth daily.   Yes [provider]  atorvastatin (LIPITOR) 40 MG tablet Take 1 tablet (40 mg total) by mouth daily. 01/24/21  Yes McGowen, Adrian Blackwater, MD  Cholecalciferol (VITAMIN D PO) Take 1 capsule by mouth daily.   Yes [provider]  Coenzyme Q10 (COQ10 PO) Take 300 mg by mouth daily.   Yes [provider]  gabapentin (NEURONTIN) 100 MG capsule Take 1 capsule (100 mg total) by mouth 3 (three) times daily. 01/06/21  Yes McGowen, Adrian Blackwater, MD  hydrocortisone (ANUSOL-HC) 2.5 % rectal cream Place 1 application rectally 2 (two) times daily. Patient taking differently: Place 1 application rectally as needed. 05/19/19  McGowen, Adrian Blackwater, MD    Current Outpatient Medications  Medication Sig Dispense Refill   aspirin EC 81 MG tablet Take 81 mg by mouth daily.     atorvastatin (LIPITOR) 40 MG tablet Take 1 tablet (40 mg total) by mouth daily. 90 tablet 1   Cholecalciferol (VITAMIN D PO) Take 1 capsule by mouth daily.     Coenzyme Q10 (COQ10 PO) Take 300 mg by mouth daily.     gabapentin (NEURONTIN) 100 MG capsule Take 1 capsule (100 mg total) by mouth 3 (three) times daily. 270 capsule 3   hydrocortisone (ANUSOL-HC) 2.5 % rectal cream Place 1 application rectally 2 (two) times daily. (Patient taking differently: Place 1 application rectally as needed.) 30 g 3   Current Facility-Administered Medications  Medication Dose Route Frequency Provider Last Rate Last Admin    0.9 %  sodium chloride infusion  500 mL Intravenous Once Jaksen Fiorella, Lajuan Lines, MD        Allergies as of 05/09/2021 - Review Complete 05/09/2021  Allergen Reaction Noted   Penicillins Anaphylaxis 02/14/2018    Family History  Problem Relation Age of Onset   Arthritis Mother    Diabetes Mother    Osteoporosis Mother    Depression Father    Heart attack Father    Heart disease Father    Hyperlipidemia Father    Hypertension Father    Stroke Father    Aortic aneurysm Father    Cancer Brother    Heart disease Brother    Hyperlipidemia Brother    Hypertension Brother    Colon cancer Brother 30   Colon polyps Brother    Colon polyps Brother    Colon cancer Brother    Diabetes Maternal Grandmother    Early death Paternal Grandfather    Esophageal cancer Neg Hx    Rectal cancer Neg Hx    Stomach cancer Neg Hx     Social History   Socioeconomic History   Marital status: Married    Spouse name: Not on file   Number of children: Not on file   Years of education: Not on file   Highest education level: Not on file  Occupational History   Not on file  Tobacco Use   Smoking status: Never   Smokeless tobacco: Never  Vaping Use   Vaping Use: Never used  Substance and Sexual Activity   Alcohol use: Yes    Alcohol/week: 1.0 standard drink    Types: 1 Glasses of wine per week    Comment: glass of wine per week   Drug use: Never   Sexual activity: Yes  Other Topics Concern   Not on file  Social History Narrative   Married, 2 grown children.  No grandchildren.   [name] Degree: Marketing executive.   Gavin [name]; retired Chief Financial Officer.   Alc: 2 drinks per week.   No tob.   Social Determinants of Health   Financial Resource Strain: Low Risk    Difficulty of Paying Living Expenses: Not hard at all  Food Insecurity: No Food Insecurity   Worried About Charity fundraiser in the Last Year: Never true   [place] in the Last Year: Never true  Transportation Needs: No Transportation Needs    Lack of Transportation (Medical): No   Lack of Transportation (Non-Medical): No  Physical Activity: Sufficiently Active   Days of Exercise per Week: 5 days   Minutes of Exercise per Session: 40 min  Stress: No Stress Concern Present  Feeling of Stress : Not at all  Social Connections: Moderately Isolated   Frequency of Communication with Friends and Family: More than three times a week   Frequency of Social Gatherings with Friends and Family: Never   Attends Religious Services: Never   Marine scientist or Organizations: No   Attends Music therapist: Never   Marital Status: Married  Human resources officer Violence: Not At Risk   Fear of Current or Ex-Partner: No   Emotionally Abused: No   Physically Abused: No   Sexually Abused: No    Physical Exam: Vital signs in last 24 hours: @BP  117/74   Pulse 76   Temp 98.4 F (36.9 C)   Ht 6' (1.829 m)   Wt 215 lb (97.5 kg)   SpO2 97%   BMI 29.16 kg/m  GEN: NAD EYE: Sclerae anicteric ENT: MMM CV: Non-tachycardic Pulm: CTA b/l GI: Soft, NT/ND NEURO:  Alert & Oriented x 3   Zenovia Jarred, MD [facility]  05/09/2021 9:38 AM

## 2021-05-09 NOTE — Progress Notes (Signed)
Pt's states no medical or surgical changes since previsit or office visit. 

## 2021-05-09 NOTE — Progress Notes (Signed)
Called to room to assist during endoscopic procedure.  Patient ID and intended procedure confirmed with present staff. Received instructions for my participation in the procedure from the performing physician.  

## 2021-05-13 ENCOUNTER — Telehealth: Payer: Self-pay | Admitting: *Deleted

## 2021-05-13 ENCOUNTER — Encounter: Payer: Self-pay | Admitting: Internal Medicine

## 2021-05-13 NOTE — Telephone Encounter (Signed)
  Follow up Call-  Call back number 05/09/2021  Post procedure Call Back phone  # [phone]  Permission to leave phone message Yes  Some recent data might be hidden     Patient questions:  Do you have a fever, pain , or abdominal swelling? No. Pain Score  0 *  Have you tolerated food without any problems? Yes.    Have you been able to return to your normal activities? Yes.    Do you have any questions about your discharge instructions: Diet   No. Medications  No. Follow up visit  No.  Do you have questions or concerns about your Care? No.  Actions: * If pain score is 4 or above: No action needed, pain <4.  Have you developed a fever since your procedure? no  2.   Have you had an respiratory symptoms (SOB or cough) since your procedure? no  3.   Have you tested positive for COVID 19 since your procedure no  4.   Have you had any family members/close contacts diagnosed with the COVID 19 since your procedure?  no   If yes to any of these questions please route to Joylene John, RN and Joella Prince, RN

## 2021-05-22 ENCOUNTER — Other Ambulatory Visit: Payer: Self-pay

## 2021-05-22 ENCOUNTER — Encounter: Payer: Self-pay | Admitting: Family Medicine

## 2021-05-23 ENCOUNTER — Ambulatory Visit (INDEPENDENT_AMBULATORY_CARE_PROVIDER_SITE_OTHER): Payer: Medicare HMO | Admitting: Family Medicine

## 2021-05-23 ENCOUNTER — Ambulatory Visit (HOSPITAL_BASED_OUTPATIENT_CLINIC_OR_DEPARTMENT_OTHER)
Admission: RE | Admit: 2021-05-23 | Discharge: 2021-05-23 | Disposition: A | Discharge status: Home / Self Care | Payer: Medicare HMO | Source: Ambulatory Visit | Attending: Family Medicine | Admitting: Family Medicine

## 2021-05-23 VITALS — BP 109/70 | HR 77 | Temp 97.8°F | Ht 72.5 in | Wt 215.4 lb

## 2021-05-23 DIAGNOSIS — M25551 Pain in right hip: Secondary | ICD-10-CM | POA: Insufficient documentation

## 2021-05-23 DIAGNOSIS — S76211A Strain of adductor muscle, fascia and tendon of right thigh, initial encounter: Secondary | ICD-10-CM

## 2021-05-23 DIAGNOSIS — M1611 Unilateral primary osteoarthritis, right hip: Secondary | ICD-10-CM | POA: Diagnosis not present

## 2021-05-23 LAB — CBC WITH DIFFERENTIAL/PLATELET
Basophils Absolute: 0 10*3/uL (ref 0.0–0.1)
Basophils Relative: 0.6 % (ref 0.0–3.0)
Eosinophils Absolute: 0.2 10*3/uL (ref 0.0–0.7)
Eosinophils Relative: 2.4 % (ref 0.0–5.0)
HCT: 43.2 % (ref 39.0–52.0)
Hemoglobin: 14.4 g/dL (ref 13.0–17.0)
Lymphocytes Relative: 21 % (ref 12.0–46.0)
Lymphs Abs: 1.7 10*3/uL (ref 0.7–4.0)
MCHC: 33.4 g/dL (ref 30.0–36.0)
MCV: 86.4 fl (ref 78.0–100.0)
Monocytes Absolute: 1 10*3/uL (ref 0.1–1.0)
Monocytes Relative: 12.1 % — ABNORMAL HIGH (ref 3.0–12.0)
Neutro Abs: 5.2 10*3/uL (ref 1.4–7.7)
Neutrophils Relative %: 63.9 % (ref 43.0–77.0)
Platelets: 248 10*3/uL (ref 150.0–400.0)
RBC: 5.01 Mil/uL (ref 4.22–5.81)
RDW: 13.4 % (ref 11.5–15.5)
WBC: 8.2 10*3/uL (ref 4.0–10.5)

## 2021-05-23 LAB — BASIC METABOLIC PANEL
BUN: 17 mg/dL (ref 6–23)
CO2: 29 mEq/L (ref 19–32)
Calcium: 9.3 mg/dL (ref 8.4–10.5)
Chloride: 106 mEq/L (ref 96–112)
Creatinine, Ser: 1.01 mg/dL (ref 0.40–1.50)
GFR: 74.95 mL/min (ref >60.00)
Glucose, Bld: 63 mg/dL — ABNORMAL LOW (ref 70–99)
Potassium: 4.2 mEq/L (ref 3.5–5.1)
Sodium: 143 mEq/L (ref 135–145)

## 2021-05-23 NOTE — Progress Notes (Signed)
OFFICE VISIT  05/23/2021  CC:  Chief Complaint  Patient presents with   Hip Pain    Right, per pt for 6-8 weeks I have had pain in my right hip, upper leg. I don't think it is joint, rather muscle/tendon. It hurts when I raise my leg, or rotate it. It hurts going up stairs, and getting in and out of car. It aches at night. I thought it would resolve. Takes regular tylenol and PM as needed    HPI:    Patient is a 70 y.o. male who presents for right hip pain.  INTERIM HX: Onset of pain in the right anterior hip area about 2-1/2 to 3 months ago.  He says a typical respiratory COVID illness preceded the pain by a week or 2.  No preceding injury/strain.  It is gradually been hurting more often and more severely.  It aches at times even when not weightbearing or walking.  However it is significantly exacerbated by hip flexion and external rotation.  He notes no bulge in the groin area.  No right leg swelling.  No fever, chills, night sweats, or malaise. No history of DVT or PE. He has taken Tylenol for the pain but not much relief.  He wishes to avoid nonsteroidals.  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   Cataract    Bilat: has lens implants bilat.   Clotting disorder ([place]) 2004   Colon polyp 09/19/2010   adenomas, recall 3 yrs.   COVID-19 virus infection 04/05/2021   Depression    Family history of colon cancer    Brother: in his 9s.   Hemorrhoids    History of fusion of cervical spine    History of left lateral epicondylitis    History of retinal detachment    OU   Hyperlipidemia    Migraines    Myocardial infarct ([place])    2004, when pt's stent clotted.  He got CABG at [facility] at that time.   Prediabetes    04/2019-->HbA1c 5.9% (unchanged compared to 2/3 yrs prior). 5.8% 11/2019  6.2%  05/2020. A1c 5.7% 11/2020.   Pseudophakia    RBBB     Past Surgical History:  Procedure Laterality Date   CARDIOVASCULAR STRESS TEST  2013   No ischemia.  EF 70%.   CATARACT EXTRACTION, BILATERAL     with lens implants   CERVICAL FUSION  2006   C5/6   CHOLECYSTECTOMY  2004   COLONOSCOPY  09/19/2010; 11/02/13; 04/28/18   2012: repeat 3 yrs, benign adanomas.  2015 same.  2019 same.  Recall 3 yrs.   CORONARY ARTERY BYPASS GRAFT  2004   CABGx4   CORONARY STENT PLACEMENT     BMS in 1990s.   POLYPECTOMY     RETINAL DETACHMENT SURGERY N/A    2011 & 2013 both eyes affected   TONSILLECTOMY  1956    Outpatient Medications Prior to Visit  Medication Sig Dispense Refill   aspirin EC 81 MG tablet Take 81 mg by mouth daily.     atorvastatin (LIPITOR) 40 MG tablet Take 1 tablet (40 mg total) by mouth daily. 90 tablet 1   Cholecalciferol (VITAMIN D PO) Take 1 capsule by mouth daily.     Coenzyme Q10 (COQ10  PO) Take 300 mg by mouth daily.     gabapentin (NEURONTIN) 100 MG capsule Take 1 capsule (100 mg total) by mouth 3 (three) times daily. 270 capsule 3   hydrocortisone (ANUSOL-HC) 2.5 % rectal cream Place 1 application rectally 2 (two) times daily. (Patient taking differently: Place 1 application rectally as needed.) 30 g 3   No facility-administered medications prior to visit.    Allergies  Allergen Reactions   Penicillins Anaphylaxis    ROS As per HPI  PE: Vitals with BMI 05/23/2021 05/09/2021 05/09/2021  Height 6' 0.5" - -  Weight 215 lbs 6 oz - -  BMI 11.6 - -  Systolic 579 038 333  Diastolic 70 72 73  Pulse 77 65 65     General: Alert, well-appearing. Affect: Pleasant, lucid thought and speech. Right hip: No tenderness to palpation anywhere in groin area or about the hip except very mild tenderness over the right greater trochanter.  He has significant pain with active and passive hip flexion and can only go to about 75% of full flexion due to severe pain.  Active and  passive external rotation exacerbates his pain.  Strength is intact in right leg proximally and distally.  He has no discoloration or swelling.  LABS:  Lab Results  Component Value Date   HGBA1C 5.7 (H) 12/09/2020     Chemistry      Component Value Date/Time   NA 142 12/09/2020 0930   NA 140 12/01/2012 0000   K 4.2 12/09/2020 0930   CL 103 12/09/2020 0930   CO2 25 12/09/2020 0930   BUN 17 12/09/2020 0930   BUN 17 12/01/2012 0000   CREATININE 1.00 12/09/2020 0930   GLU 94 12/01/2012 0000      Component Value Date/Time   CALCIUM 9.0 12/09/2020 0930   ALKPHOS 77 02/22/2018 1112   AST 23 12/09/2020 0930   ALT 23 12/09/2020 0930   BILITOT 1.3 (H) 12/09/2020 0930   BILITOT 1.3 (H) 02/22/2018 1112       IMPRESSION AND PLAN:  #1 right groin pain.  Unclear etiology, unclear prognosis.   Symptoms and exam are consistent with muscle strain but he does not recall anything he may have done.  No history of hip pain in the past. No meds recommended today because he wants to avoid nonsteroidals. Will check right hip plain films.  Additionally, given onset after COVID illness we will keep the possibility of DVT in mind so we will get D-dimer, CBC, and c-Met today. If the diagnostics today are reassuring then we will proceed with a course of PT and see how things go.  An After Visit Summary was printed and given to the patient.  FOLLOW UP: Return if symptoms worsen or fail to improve.  Signed:  Crissie Sickles, MD           05/23/2021

## 2021-05-24 LAB — D-DIMER, QUANTITATIVE: D-Dimer, Quant: 0.4 mcg/mL FEU (ref <0.50)

## 2021-05-26 ENCOUNTER — Other Ambulatory Visit: Payer: Self-pay | Admitting: Family Medicine

## 2021-05-26 DIAGNOSIS — M76891 Other specified enthesopathies of right lower limb, excluding foot: Secondary | ICD-10-CM

## 2021-05-26 DIAGNOSIS — M25551 Pain in right hip: Secondary | ICD-10-CM

## 2021-06-04 ENCOUNTER — Ambulatory Visit: Payer: Medicare HMO

## 2021-06-04 ENCOUNTER — Other Ambulatory Visit: Payer: Self-pay

## 2021-06-04 ENCOUNTER — Ambulatory Visit (INDEPENDENT_AMBULATORY_CARE_PROVIDER_SITE_OTHER): Payer: Medicare HMO

## 2021-06-04 DIAGNOSIS — Z0000 Encounter for general adult medical examination without abnormal findings: Secondary | ICD-10-CM

## 2021-06-04 NOTE — Progress Notes (Signed)
Virtual Visit via Telephone Note  I connected with  Gavin [name] on 06/04/21 at  1:00 PM EST by telephone and verified that I am speaking with the correct person using two identifiers.  Medicare Annual Wellness visit completed telephonically due to Covid-19 pandemic.   Persons participating in this call: This Health Coach and this patient.   Location: Patient: home Provider: office   I discussed the limitations, risks, security and privacy concerns of performing an evaluation and management service by telephone and the availability of in person appointments. The patient expressed understanding and agreed to proceed.  Unable to perform video visit due to video visit attempted and failed and/or patient does not have video capability.   Some vital signs may be absent or patient reported.   Willette Brace, LPN   Subjective:   Gavin [name] is a 70 y.o. male who presents for Medicare Annual/Subsequent preventive examination.  Review of Systems     Cardiac Risk Factors include: advanced age (>64mn, >>60women);male gender;dyslipidemia     Objective:    Today's Vitals   06/04/21 1301  PainSc: 0-No pain   There is no height or weight on file to calculate BMI.  Advanced Directives 06/04/2021 05/29/2020 05/02/2019  Does Patient Have a Medical Advance Directive? Yes No No  Does patient want to make changes to medical advance directive? Yes (MAU/Ambulatory/Procedural Areas - Information given) - -  Would patient like information on creating a medical advance directive? - No - Patient declined No - Patient declined    Current Medications (verified) Outpatient Encounter Medications as of 06/04/2021  Medication Sig   aspirin EC 81 MG tablet Take 81 mg by mouth daily.   atorvastatin (LIPITOR) 40 MG tablet Take 1 tablet (40 mg total) by mouth daily.   Cholecalciferol (VITAMIN D PO) Take 1 capsule by mouth daily.   Coenzyme Q10 (COQ10 PO) Take 300 mg by mouth daily.    gabapentin (NEURONTIN) 100 MG capsule Take 1 capsule (100 mg total) by mouth 3 (three) times daily.   hydrocortisone (ANUSOL-HC) 2.5 % rectal cream Place 1 application rectally 2 (two) times daily. (Patient not taking: Reported on 06/04/2021)   No facility-administered encounter medications on file as of 06/04/2021.    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   Cataract    Bilat: has lens implants bilat.   [place] disorder [place]   Colon polyp 09/19/2010   adenomas, recall 3 yrs.   COVID-19 virus infection 04/05/2021   Depression    Family history of colon cancer    Brother: in his 366s   Hemorrhoids    History of fusion of cervical spine    History of left lateral epicondylitis    History of retinal detachment    OU   Hyperlipidemia    Migraines    [place] when [place]'s stent clotted.  [facility]   Prediabetes    04/2019-->HbA1c 5.9% (unchanged compared to 2/3 yrs prior). 5.8% 11/2019  6.2% 05/2020. A1c 5.7% 11/2020.   Pseudophakia    RBBB    Past Surgical History:  Procedure Laterality Date   CARDIOVASCULAR STRESS TEST  2013  No ischemia.  EF 70%.   CATARACT EXTRACTION, BILATERAL     with lens implants   CERVICAL FUSION  2006   C5/6   CHOLECYSTECTOMY  2004   COLONOSCOPY  09/19/2010; 11/02/13; 04/28/18   2012: repeat 3 yrs, benign adanomas.  2015 same.  2019 same.  Recall 3 yrs.   CORONARY ARTERY BYPASS GRAFT  2004   CABGx4   CORONARY STENT PLACEMENT     BMS in 1990s.   POLYPECTOMY     RETINAL DETACHMENT SURGERY N/A    2011 & 2013 both eyes affected   TONSILLECTOMY  1956   Family History  Problem Relation Age of Onset   Arthritis Mother    Diabetes Mother    Osteoporosis Mother     Depression Father    Heart attack Father    Heart disease Father    Hyperlipidemia Father    Hypertension Father    Stroke Father    Aortic aneurysm Father    Cancer Brother    Heart disease Brother    Hyperlipidemia Brother    Hypertension Brother    Colon cancer Brother 35   Colon polyps Brother    Colon polyps Brother    Colon cancer Brother    Diabetes Maternal Grandmother    Early death Paternal Grandfather    Esophageal cancer Neg Hx    Rectal cancer Neg Hx    Stomach cancer Neg Hx    Social History   Socioeconomic History   Marital status: Married    Spouse name: Not on file   Number of children: Not on file   Years of education: Not on file   Highest education level: Master's degree (e.g., MA, MS, MEng, MEd, MSW, MBA)  Occupational History   Not on file  Tobacco Use   Smoking status: Never   Smokeless tobacco: Never  Vaping Use   Vaping Use: Never used  Substance and Sexual Activity   Alcohol use: Yes    Alcohol/week: 1.0 standard drink    Types: 1 Glasses of wine per week    Comment: glass of wine per week   Drug use: Never   Sexual activity: Yes  Other Topics Concern   Not on file  Social History Narrative   Married, 2 grown children.  No grandchildren.   Masters Degree: Marketing executive.   [name]; retired Chief Financial Officer.   Alc: 2 drinks per week.   No tob.   Social Determinants of Health   Financial Resource Strain: Low Risk    Difficulty of Paying Living Expenses: Not hard at all  Food Insecurity: No Food Insecurity   Worried About Charity fundraiser in the Last Year: Never true   [employer] in the Last Year: Never true  Transportation Needs: No Transportation Needs   Lack of Transportation (Medical): No   Lack of Transportation (Non-Medical): No  Physical Activity: Inactive   Days of Exercise per Week: 0 days   Minutes of Exercise per Session: 0 min  Stress: No Stress Concern Present   Feeling of Stress : Not at all  Social Connections:  Moderately Isolated   Frequency of Communication with Friends and Family: More than three times a week   Frequency of Social Gatherings with Friends and Family: Twice a week   Attends Religious Services: Never   Marine scientist or Organizations: No   Attends Archivist Meetings: Never   Marital Status: Married    Tobacco Counseling Counseling given:  Not Answered   Clinical Intake:  Pre-visit preparation completed: Yes  Pain : 0-10 Pain Score: 0-No pain Pain Type: Chronic pain Pain Location: Hip (right hip) Pain Orientation: Right Pain Descriptors / Indicators: Aching Pain Onset: More than a month ago Pain Frequency: Intermittent     BMI - recorded: 28.81 Nutritional Status: BMI 25 -29 Overweight Nutritional Risks: None Diabetes: No  How often do you need to have someone help you when you read instructions, pamphlets, or other written materials from your doctor or pharmacy?: 1 - NeverDiabetic; no       Interpreter Needed?: No  Information entered by :: Charlott Rakes, LPN   Activities of Daily Living In your present state of health, do you have any difficulty performing the following activities: 06/04/2021  Hearing? N  Vision? N  Difficulty concentrating or making decisions? N  Walking or climbing stairs? Y  Comment with the hip pain  Dressing or bathing? N  Doing errands, shopping? N  Preparing Food and eating ? N  Using the Toilet? N  In the past six months, have you accidently leaked urine? N  Do you have problems with loss of bowel control? N  Managing your Medications? N  Managing your Finances? N  Housekeeping or managing your Housekeeping? N  Some recent data might be hidden    Patient Care Team: Tammi Sou, MD as PCP - General (Family Medicine) Lorretta Harp, MD as PCP - Cardiology (Cardiology) Lorretta Harp, MD as Consulting Physician (Cardiology) Pyrtle, Lajuan Lines, MD as Consulting Physician  (Gastroenterology)  Indicate any recent Medical Services you may have received from other than [facility] providers in the past year (date may be approximate).     Assessment:   This is a routine wellness examination for Gavin [name].  Hearing/Vision screen Hearing Screening - Comments:: Pt denies any hearing issues  Vision Screening - Comments:: Pt follows up with Dr in [place] for annual eye exams   Dietary issues and exercise activities discussed: Current Exercise Habits: The patient does not participate in regular exercise at present   Goals Addressed             This Visit's Progress    Patient Stated       Get back to being active        Depression Screen PHQ 2/9 Scores 06/04/2021 05/23/2021 05/29/2020 04/26/2020 01/29/2020 05/02/2019 11/18/2018  PHQ - 2 Score 0 0 0 0 0 0 0  PHQ- 9 Score - - - - 0 - 1    Fall Risk Fall Risk  06/04/2021 05/23/2021 05/29/2020 04/26/2020 05/02/2019  Falls in the past year? 0 0 0 0 0  Number falls in past yr: 0 - 0 0 0  Injury with Fall? 0 - 0 0 0  Follow up Falls prevention discussed - Falls prevention discussed - Falls prevention discussed    FALL RISK PREVENTION PERTAINING TO THE HOME:   Any stairs in or around the home? Yes  If so, are there any without handrails? No  Home free of loose throw rugs in walkways, pet beds, electrical cords, etc? Yes  Adequate lighting in your home to reduce risk of falls? Yes   ASSISTIVE DEVICES UTILIZED TO PREVENT FALLS:  Life alert? No  Use of a cane, walker or w/c? No  Grab bars in the bathroom? No  Shower chair or bench in shower? Yes  Elevated toilet seat or a handicapped toilet? No   TIMED UP AND GO:  Was  the test performed? No .   Cognitive Function: MMSE - Mini Mental State Exam 05/02/2019  Orientation to time 5  Orientation to Place 5  Registration 3  Attention/ Calculation 5  Recall 3  Language- name 2 objects 2  Language- repeat 1  Language- follow 3 step command 3  Language- read &  follow direction 1  Write a sentence 1  Copy design 1  Total score 30     6CIT Screen 06/04/2021  What Year? 0 points  What month? 0 points  What time? 0 points  Count back from 20 0 points  Months in reverse 0 points  Repeat phrase 0 points  Total Score 0    Immunizations Immunization History  Administered Date(s) Administered   Fluad Quad(high Dose 65+) 04/05/2019   Hepatitis A 02/07/2007, 07/11/2007   Hepatitis B 01/07/2007, 02/07/2007, 07/11/2007   IPV 01/07/2007   Influenza, High Dose Seasonal PF 04/07/2018, 03/16/2020   Influenza-Unspecified 05/09/2015, 03/27/2016, 04/28/2017, 03/12/2021   MMR 01/07/2007   PFIZER(Purple Top)SARS-COV-2 Vaccination 09/11/2019, 10/02/2019, 04/12/2020, 11/06/2020   PPD Test 07/20/2010   Pneumococcal Conjugate-13 05/09/2015   Pneumococcal Polysaccharide-23 03/28/2014   Pneumococcal-Unspecified 07/21/2015   Td 07/21/2015   Tdap 03/28/2014   Typhoid Live 01/07/2007, 09/09/2010   Zoster Recombinat (Shingrix) 06/19/2018, 09/16/2018   Zoster, Live 03/28/2014    TDAP status: Up to date  Flu Vaccine status: Up to date  Pneumococcal vaccine status: Up to date  Covid-19 vaccine status: Completed vaccines  Qualifies for Shingles Vaccine? Yes   Zostavax completed Yes   Shingrix Completed?: Yes  Screening Tests Health Maintenance  Topic Date Due   URINE MICROALBUMIN  Never done   Pneumonia Vaccine 49+ Years old (3 - PPSV23 if available, else PCV20) 03/29/2019   COVID-19 Vaccine (5 - Booster for Pfizer series) 06/08/2021 (Originally 01/01/2021)   TETANUS/TDAP  07/20/2025   COLONOSCOPY (Pts 45-58yr Insurance coverage will need to be confirmed)  05/09/2026   INFLUENZA VACCINE  Completed   Zoster Vaccines- Shingrix  Completed   HPV VACCINES  Aged Out   Hepatitis C Screening  Discontinued    Health Maintenance  Health Maintenance Due  Topic Date Due   URINE MICROALBUMIN  Never done   Pneumonia Vaccine 71 Years old (3 - PPSV23 if  available, else PCV20) 03/29/2019    Colorectal cancer screening: Type of screening: Colonoscopy. Completed 05/09/21. Repeat every 5 years  Additional Screening:  Hepatitis C Screening: does not qualify  Vision Screening: Recommended annual ophthalmology exams for early detection of glaucoma and other disorders of the eye. Is the patient up to date with their annual eye exam?  Yes  Who is the provider or what is the name of the office in which the patient attends annual eye exams? Dr [place] If pt is not established with a provider, would they like to be referred to a provider to establish care? No .   Dental Screening: Recommended annual dental exams for proper oral hygiene  Community Resource Referral / Chronic Care Management: CRR required this visit?  No   CCM required this visit?  No      Plan:     I have personally reviewed and noted the following in the patient's chart:   Medical and social history Use of alcohol, tobacco or illicit drugs  Current medications and supplements including opioid prescriptions. Patient is not currently taking opioid prescriptions. Functional ability and status Nutritional status Physical activity Advanced directives List of other physicians Hospitalizations, surgeries,  and ER visits in previous 12 months Vitals Screenings to include cognitive, depression, and falls Referrals and appointments  In addition, I have reviewed and discussed with patient certain preventive protocols, quality metrics, and best practice recommendations. A written personalized care plan for preventive services as well as general preventive health recommendations were provided to patient.     Willette Brace, LPN   [id number]   Nurse Notes: None

## 2021-06-04 NOTE — Patient Instructions (Addendum)
Gavin [name] , Thank you for taking time to come for your Medicare Wellness Visit. I appreciate your ongoing commitment to your health goals. Please review the following plan we discussed and let me know if I can assist you in the future.   Screening recommendations/referrals: Colonoscopy: done 05/09/21 repeat every 5 year Recommended yearly ophthalmology/optometry visit for glaucoma screening and checkup Recommended yearly dental visit for hygiene and checkup  Vaccinations: Influenza vaccine: done 03/12/21  Pneumococcal vaccine: Up to date Tdap vaccine: done 07/21/15 repeat every 10 years Shingles vaccine: completed 06/19/18 & 09/16/18   Covid-19: completed 2/22, 3/15, 04/12/20 & 11/06/20  Advanced directives: Please bring a copy of your health care power of attorney and living will to the office at your convenience.  Conditions/risks identified: Get back to being active   Next appointment: Follow up in one year for your annual wellness visit.   Preventive Care 71 Years and Older, Male Preventive care refers to lifestyle choices and visits with your health care provider that can promote health and wellness. What does preventive care include? A yearly physical exam. This is also called an annual well check. Dental exams once or twice a year. Routine eye exams. Ask your health care provider how often you should have your eyes checked. Personal lifestyle choices, including: Daily care of your teeth and gums. Regular physical activity. Eating a healthy diet. Avoiding tobacco and drug use. Limiting alcohol use. Practicing safe sex. Taking low doses of aspirin every day. Taking vitamin and mineral supplements as recommended by your health care provider. What happens during an annual well check? The services and screenings done by your health care provider during your annual well check will depend on your age, overall health, lifestyle risk factors, and family history of disease. Counseling   Your health care provider may ask you questions about your: Alcohol use. Tobacco use. Drug use. Emotional well-being. Home and relationship well-being. Sexual activity. Eating habits. History of falls. Memory and ability to understand (cognition). Work and work Statistician. Screening  You may have the following tests or measurements: Height, weight, and BMI. Blood pressure. Lipid and cholesterol levels. These may be checked every 5 years, or more frequently if you are over 57 years old. Skin check. Lung cancer screening. You may have this screening every year starting at age 81 if you have a 30-pack-year history of smoking and currently smoke or have quit within the past 15 years. Fecal occult blood test (FOBT) of the stool. You may have this test every year starting at age 69. Flexible sigmoidoscopy or colonoscopy. You may have a sigmoidoscopy every 5 years or a colonoscopy every 10 years starting at age 90. Prostate cancer screening. Recommendations will vary depending on your family history and other risks. Hepatitis C blood test. Hepatitis B blood test. Sexually transmitted disease (STD) testing. Diabetes screening. This is done by checking your blood sugar (glucose) after you have not eaten for a while (fasting). You may have this done every 1-3 years. Abdominal aortic aneurysm (AAA) screening. You may need this if you are a current or former smoker. Osteoporosis. You may be screened starting at age 71 if you are at high risk. Talk with your health care provider about your test results, treatment options, and if necessary, the need for more tests. Vaccines  Your health care provider may recommend certain vaccines, such as: Influenza vaccine. This is recommended every year. Tetanus, diphtheria, and acellular pertussis (Tdap, Td) vaccine. You may need a Td booster every  10 years. Zoster vaccine. You may need this after age 38. Pneumococcal 13-valent conjugate (PCV13) vaccine.  One dose is recommended after age 71. Pneumococcal polysaccharide (PPSV23) vaccine. One dose is recommended after age 40. Talk to your health care provider about which screenings and vaccines you need and how often you need them. This information is not intended to replace advice given to you by your health care provider. Make sure you discuss any questions you have with your health care provider. Document Released: 08/02/2015 Document Revised: 03/25/2016 Document Reviewed: 05/07/2015 Elsevier Interactive Patient Education  2017 [place] Prevention in the Home Falls can cause injuries. They can happen to people of all ages. There are many things you can do to make your home safe and to help prevent falls. What can I do on the outside of my home? Regularly fix the edges of walkways and driveways and fix any cracks. Remove anything that might make you trip as you walk through a door, such as a raised step or threshold. Trim any bushes or trees on the path to your home. Use bright outdoor lighting. Clear any walking paths of anything that might make someone trip, such as rocks or tools. Regularly check to see if handrails are loose or broken. Make sure that both sides of any steps have handrails. Any raised decks and porches should have guardrails on the edges. Have any leaves, snow, or ice cleared regularly. Use sand or salt on walking paths during winter. Clean up any spills in your garage right away. This includes oil or grease spills. What can I do in the bathroom? Use night lights. Install grab bars by the toilet and in the tub and shower. Do not use towel bars as grab bars. Use non-skid mats or decals in the tub or shower. If you need to sit down in the shower, use a plastic, non-slip stool. Keep the floor dry. Clean up any water that spills on the floor as soon as it happens. Remove soap buildup in the tub or shower regularly. Attach bath mats securely with double-sided  non-slip rug tape. Do not have throw rugs and other things on the floor that can make you trip. What can I do in the bedroom? Use night lights. Make sure that you have a light by your bed that is easy to reach. Do not use any sheets or blankets that are too big for your bed. They should not hang down onto the floor. Have a firm chair that has side arms. You can use this for support while you get dressed. Do not have throw rugs and other things on the floor that can make you trip. What can I do in the kitchen? Clean up any spills right away. Avoid walking on wet floors. Keep items that you use a lot in easy-to-reach places. If you need to reach something above you, use a strong step stool that has a grab bar. Keep electrical cords out of the way. Do not use floor polish or wax that makes floors slippery. If you must use wax, use non-skid floor wax. Do not have throw rugs and other things on the floor that can make you trip. What can I do with my stairs? Do not leave any items on the stairs. Make sure that there are handrails on both sides of the stairs and use them. Fix handrails that are broken or loose. Make sure that handrails are as long as the stairways. Check any carpeting to make  sure that it is firmly attached to the stairs. Fix any carpet that is loose or worn. Avoid having throw rugs at the top or bottom of the stairs. If you do have throw rugs, attach them to the floor with carpet tape. Make sure that you have a light switch at the top of the stairs and the bottom of the stairs. If you do not have them, ask someone to add them for you. What else can I do to help prevent falls? Wear shoes that: Do not have high heels. Have rubber bottoms. Are comfortable and fit you well. Are closed at the toe. Do not wear sandals. If you use a stepladder: Make sure that it is fully opened. Do not climb a closed stepladder. Make sure that both sides of the stepladder are locked into place. Ask  someone to hold it for you, if possible. Clearly mark and make sure that you can see: Any grab bars or handrails. First and last steps. Where the edge of each step is. Use tools that help you move around (mobility aids) if they are needed. These include: Canes. Walkers. Scooters. Crutches. Turn on the lights when you go into a dark area. Replace any light bulbs as soon as they burn out. Set up your furniture so you have a clear path. Avoid moving your furniture around. If any of your floors are uneven, fix them. If there are any pets around you, be aware of where they are. Review your medicines with your doctor. Some medicines can make you feel dizzy. This can increase your chance of falling. Ask your doctor what other things that you can do to help prevent falls. This information is not intended to replace advice given to you by your health care provider. Make sure you discuss any questions you have with your health care provider. Document Released: 05/02/2009 Document Revised: 12/12/2015 Document Reviewed: 08/10/2014 Elsevier Interactive Patient Education  2017 [employer].

## 2021-06-05 DIAGNOSIS — M25651 Stiffness of right hip, not elsewhere classified: Secondary | ICD-10-CM | POA: Diagnosis not present

## 2021-06-05 DIAGNOSIS — M25551 Pain in right hip: Secondary | ICD-10-CM | POA: Diagnosis not present

## 2021-06-05 DIAGNOSIS — R262 Difficulty in walking, not elsewhere classified: Secondary | ICD-10-CM | POA: Diagnosis not present

## 2021-06-10 DIAGNOSIS — R262 Difficulty in walking, not elsewhere classified: Secondary | ICD-10-CM | POA: Diagnosis not present

## 2021-06-10 DIAGNOSIS — M25551 Pain in right hip: Secondary | ICD-10-CM | POA: Diagnosis not present

## 2021-06-10 DIAGNOSIS — M25651 Stiffness of right hip, not elsewhere classified: Secondary | ICD-10-CM | POA: Diagnosis not present

## 2021-06-14 DIAGNOSIS — M25551 Pain in right hip: Secondary | ICD-10-CM | POA: Diagnosis not present

## 2021-06-14 DIAGNOSIS — M25651 Stiffness of right hip, not elsewhere classified: Secondary | ICD-10-CM | POA: Diagnosis not present

## 2021-06-14 DIAGNOSIS — R262 Difficulty in walking, not elsewhere classified: Secondary | ICD-10-CM | POA: Diagnosis not present

## 2021-06-17 ENCOUNTER — Other Ambulatory Visit: Payer: Self-pay

## 2021-06-17 ENCOUNTER — Ambulatory Visit (INDEPENDENT_AMBULATORY_CARE_PROVIDER_SITE_OTHER): Payer: Medicare HMO | Admitting: Family Medicine

## 2021-06-17 ENCOUNTER — Encounter: Payer: Self-pay | Admitting: Family Medicine

## 2021-06-17 VITALS — BP 119/80 | HR 73 | Temp 97.7°F | Ht 73.0 in | Wt 214.2 lb

## 2021-06-17 DIAGNOSIS — R7303 Prediabetes: Secondary | ICD-10-CM | POA: Diagnosis not present

## 2021-06-17 DIAGNOSIS — M25551 Pain in right hip: Secondary | ICD-10-CM | POA: Diagnosis not present

## 2021-06-17 DIAGNOSIS — E7800 Pure hypercholesterolemia, unspecified: Secondary | ICD-10-CM | POA: Diagnosis not present

## 2021-06-17 DIAGNOSIS — Z125 Encounter for screening for malignant neoplasm of prostate: Secondary | ICD-10-CM | POA: Diagnosis not present

## 2021-06-17 DIAGNOSIS — Z0000 Encounter for general adult medical examination without abnormal findings: Secondary | ICD-10-CM

## 2021-06-17 DIAGNOSIS — M1611 Unilateral primary osteoarthritis, right hip: Secondary | ICD-10-CM

## 2021-06-17 MED ORDER — TRIAMCINOLONE ACETONIDE 40 MG/ML IJ SUSP
40.0000 mg | Freq: Once | INTRAMUSCULAR | Status: AC
  Administered 2021-06-17: 40 mg via INTRAMUSCULAR

## 2021-06-17 NOTE — Progress Notes (Signed)
Office Note 06/17/2021  CC:  Chief Complaint  Patient presents with   Annual Exam    Pt is fasting    HPI:  Patient is a 70 y.o. male who is here for annual health maintenance exam and f/u prediabetes and HLD. He has hx of CAD, s/p stenting, MI, and CABG in remote past, followed by Dr. Gwenlyn Found.  Gavin [name] is doing well and has no problems taking his medications.  Also 3 wk f/u hip pain: His hip pain is no better with PT.  His range of motion of his hip is improving.  He has had 4 sessions.  His therapist has mentioned that he feels like this is an intra-articular hip issue rather than muscular.   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   Cataract    Bilat: has lens implants bilat.   Clotting disorder ([place]) 2004   COVID-19 virus infection 04/05/2021   Depression    Family history of colon cancer    Brother: in his 106s.   Hemorrhoids    History of fusion of cervical spine    History of left lateral epicondylitis    History of retinal detachment    OU   Hyperlipidemia    Migraines    Myocardial infarct ([place])    2004, when pt's stent clotted.  He got CABG at [facility] at that time.   Prediabetes    04/2019-->HbA1c 5.9% (unchanged compared to 2/3 yrs prior). 5.8% 11/2019  6.2% 05/2020. A1c 5.7% 11/2020.   Pseudophakia    RBBB     Past Surgical History:  Procedure Laterality Date   CARDIOVASCULAR STRESS TEST  2013   No ischemia.  EF 70%.   CATARACT EXTRACTION, BILATERAL     with lens implants   CERVICAL FUSION  2006   C5/6   CHOLECYSTECTOMY  2004   COLONOSCOPY  09/19/2010; 11/02/13; 04/28/18   2012: repeat 3 yrs,adenomas.  2015 same.  2019 same.  2022 same->Recall 3-5 yrs.   CORONARY ARTERY BYPASS GRAFT  2004   CABGx4   CORONARY STENT PLACEMENT     BMS in 1990s.   POLYPECTOMY      RETINAL DETACHMENT SURGERY N/A    2011 & 2013 both eyes affected   TONSILLECTOMY  1956    Family History  Problem Relation Age of Onset   Arthritis Mother    Diabetes Mother    Osteoporosis Mother    Depression Father    Heart attack Father    Heart disease Father    Hyperlipidemia Father    Hypertension Father    Stroke Father    Aortic aneurysm Father    Cancer Brother    Heart disease Brother    Hyperlipidemia Brother    Hypertension Brother    Colon cancer Brother 93   Colon polyps Brother    Colon polyps Brother    Colon cancer Brother    Diabetes Maternal Grandmother    Early death Paternal Grandfather    Esophageal cancer Neg Hx    Rectal cancer Neg Hx    Stomach cancer Neg Hx     Social History   Socioeconomic History   Marital status: Married    Spouse name: Not on file   Number of children: Not  on file   Years of education: Not on file   Highest education level: Master's degree (e.g., MA, MS, MEng, MEd, MSW, MBA)  Occupational History   Not on file  Tobacco Use   Smoking status: Never   Smokeless tobacco: Never  Vaping Use   Vaping Use: Never used  Substance and Sexual Activity   Alcohol use: Yes    Alcohol/week: 1.0 standard drink    Types: 1 Glasses of wine per week    Comment: glass of wine per week   Drug use: Never   Sexual activity: Yes  Other Topics Concern   Not on file  Social History Narrative   Married, 2 grown children.  No grandchildren.   Masters Degree: Marketing executive.   [name]; retired Chief Financial Officer.   Alc: 2 drinks per week.   No tob.   Social Determinants of Health   Financial Resource Strain: Low Risk    Difficulty of Paying Living Expenses: Not hard at all  Food Insecurity: No Food Insecurity   Worried About Charity fundraiser in the Last Year: Never true   [place] in the Last Year: Never true  Transportation Needs: No Transportation Needs   Lack of Transportation (Medical): No   Lack of Transportation (Non-Medical):  No  Physical Activity: Inactive   Days of Exercise per Week: 0 days   Minutes of Exercise per Session: 0 min  Stress: No Stress Concern Present   Feeling of Stress : Not at all  Social Connections: Moderately Isolated   Frequency of Communication with Friends and Family: More than three times a week   Frequency of Social Gatherings with Friends and Family: Twice a week   Attends Religious Services: Never   Marine scientist or Organizations: No   Attends Music therapist: Never   Marital Status: Married  Human resources officer Violence: Not At Risk   Fear of Current or Ex-Partner: No   Emotionally Abused: No   Physically Abused: No   Sexually Abused: No    Outpatient Medications Prior to Visit  Medication Sig Dispense Refill   aspirin EC 81 MG tablet Take 81 mg by mouth daily.     atorvastatin (LIPITOR) 40 MG tablet Take 1 tablet (40 mg total) by mouth daily. 90 tablet 1   Cholecalciferol (VITAMIN D PO) Take 1 capsule by mouth daily.     Coenzyme Q10 (COQ10 PO) Take 300 mg by mouth daily.     gabapentin (NEURONTIN) 100 MG capsule Take 1 capsule (100 mg total) by mouth 3 (three) times daily. 270 capsule 3   hydrocortisone (ANUSOL-HC) 2.5 % rectal cream Place 1 application rectally 2 (two) times daily. (Patient not taking: Reported on 06/04/2021) 30 g 3   No facility-administered medications prior to visit.    Allergies  Allergen Reactions   Penicillins Anaphylaxis    ROS Review of Systems  Constitutional:  Negative for appetite change, chills, fatigue and fever.  HENT:  Negative for congestion, dental problem, ear pain and sore throat.   Eyes:  Negative for discharge, redness and visual disturbance.  Respiratory:  Negative for cough, chest tightness, shortness of breath and wheezing.   Cardiovascular:  Negative for chest pain, palpitations and leg swelling.  Gastrointestinal:  Negative for abdominal pain, blood in stool, diarrhea, nausea and vomiting.   Genitourinary:  Negative for difficulty urinating, dysuria, flank pain, frequency, hematuria and urgency.  Musculoskeletal:  Positive for arthralgias (right hip as per hpi). Negative for back  pain, joint swelling, myalgias and neck stiffness.  Skin:  Negative for pallor and rash.  Neurological:  Negative for dizziness, speech difficulty, weakness and headaches.  Hematological:  Negative for adenopathy. Does not bruise/bleed easily.  Psychiatric/Behavioral:  Negative for confusion and sleep disturbance. The patient is not nervous/anxious.    PE; Vitals with BMI 06/17/2021 05/23/2021 05/09/2021  Height 6\' 1"  6' 0.5" -  Weight 214 lbs 3 oz 215 lbs 6 oz -  BMI 62.37 62.8 -  Systolic 315 176 160  Diastolic 80 70 72  Pulse 73 77 65     Gen: Alert, well appearing.  Patient is oriented to person, place, time, and situation. AFFECT: pleasant, lucid thought and speech. ENT: Ears: EACs clear, normal epithelium.  TMs with good light reflex and landmarks bilaterally.  Eyes: no injection, icteris, swelling, or exudate.  EOMI, PERRLA. Nose: no drainage or turbinate edema/swelling.  No injection or focal lesion.  Mouth: lips without lesion/swelling.  Oral mucosa pink and moist.  Dentition intact and without obvious caries or gingival swelling.  Oropharynx without erythema, exudate, or swelling.  Neck: supple/nontender.  No LAD, mass, or TM.  Carotid pulses 2+ bilaterally, without bruits. CV: RRR, no m/r/g.   LUNGS: CTA bilat, nonlabored resps, good aeration in all lung fields. ABD: soft, NT, ND, BS normal.  No hepatospenomegaly or mass.  No bruits. EXT: no clubbing, cyanosis, or edema.  Musculoskeletal: no joint swelling, erythema, warmth, or tenderness.  ROM of all joints intact. Skin - no sores or suspicious lesions or rashes or color changes R hip w/out any TTP.  ROM fairly intact but signif pain with flexion and ER/IR.  Pertinent labs:  Lab Results  Component Value Date   TSH 3.17 06/10/2020    Lab Results  Component Value Date   WBC 8.2 05/23/2021   HGB 14.4 05/23/2021   HCT 43.2 05/23/2021   MCV 86.4 05/23/2021   PLT 248.0 05/23/2021   Lab Results  Component Value Date   CREATININE 1.01 05/23/2021   BUN 17 05/23/2021   NA 143 05/23/2021   K 4.2 05/23/2021   CL 106 05/23/2021   CO2 29 05/23/2021   Lab Results  Component Value Date   ALT 23 12/09/2020   AST 23 12/09/2020   ALKPHOS 77 02/22/2018   BILITOT 1.3 (H) 12/09/2020   Lab Results  Component Value Date   CHOL 98 12/09/2020   Lab Results  Component Value Date   HDL 42 12/09/2020   Lab Results  Component Value Date   LDLCALC 37 12/09/2020   Lab Results  Component Value Date   TRIG 103 12/09/2020   Lab Results  Component Value Date   CHOLHDL 2.3 12/09/2020   Lab Results  Component Value Date   PSA 1.51 06/10/2020   PSA 1.46 05/19/2019   PSA 1.18 05/20/2018   Lab Results  Component Value Date   HGBA1C 5.7 (H) 12/09/2020   Lab Results  Component Value Date   DDIMER 0.40 05/23/2021   ASSESSMENT AND PLAN:   1) Right hip pain, 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  PT helping with ROM but not pain.  Discussed options today: refer to ortho vs trial of intra-articular steroid injection today. Pt elected for injection today.  I reviewed hip x-rays again from 05/23/21--mild R hip deg arthritis.  Enthesopathy changes around iliac crest, greater troch, and inf pubic ramus.  Consent obtained after procedure fully explained. Ultrasound guidance, sterile technique, 3 cc  of 2% lidocaine w/out epi used for local anesthesia, followed by 40mg  kenalog + 2 cc of 2% lidocaine into anterior joint recess. Good visualization of needle.  Pt tolerated procedure well.  No immediate complications.  2) Health maintenance exam: Reviewed age and gender appropriate health maintenance issues (prudent diet, regular exercise, health risks of tobacco and excessive alcohol, use  of seatbelts, fire alarms in home, use of sunscreen).  Also reviewed age and gender appropriate health screening as well as vaccine recommendations. Vaccines:  ALL UTD. Labs: Hba1c, PSA, lipid panel.  CMET and CBC normal 3 wks ago. Prostate ca screening: PSA today. Colon ca screening: FH CC brother, + personal hx polyps, 2 adenomas on most recent colonoscopy 04/2021--> recall as per GI rec's---5 yrs.  An After Visit Summary was printed and given to the patient.  FOLLOW UP:  Return in about 2 weeks (around 07/01/2021) for f/u hip pain.  Signed:  Crissie Sickles, MD           06/17/2021

## 2021-06-17 NOTE — Patient Instructions (Signed)

## 2021-06-17 NOTE — Addendum Note (Signed)
Addended by: Deveron Furlong D on: 06/17/2021 12:00 PM   Modules accepted: Orders

## 2021-06-18 LAB — LIPID PANEL
Cholesterol: 104 mg/dL (ref <200)
HDL: 45 mg/dL (ref >=40)
LDL Cholesterol (Calc): 40 mg/dL (calc)
Non-HDL Cholesterol (Calc): 59 mg/dL (calc) (ref <130)
Total CHOL/HDL Ratio: 2.3 (calc) (ref <5.0)
Triglycerides: 111 mg/dL (ref <150)

## 2021-06-18 LAB — HEMOGLOBIN A1C
Hgb A1c MFr Bld: 5.9 % of total Hgb — ABNORMAL HIGH (ref <5.7)
Mean Plasma Glucose: 123 mg/dL
eAG (mmol/L): 6.8 mmol/L

## 2021-06-18 NOTE — Progress Notes (Signed)
OK, thx.

## 2021-06-19 ENCOUNTER — Other Ambulatory Visit: Payer: Medicare HMO

## 2021-06-19 DIAGNOSIS — Z125 Encounter for screening for malignant neoplasm of prostate: Secondary | ICD-10-CM | POA: Diagnosis not present

## 2021-06-19 DIAGNOSIS — R262 Difficulty in walking, not elsewhere classified: Secondary | ICD-10-CM | POA: Diagnosis not present

## 2021-06-19 DIAGNOSIS — M25551 Pain in right hip: Secondary | ICD-10-CM | POA: Diagnosis not present

## 2021-06-19 DIAGNOSIS — M25651 Stiffness of right hip, not elsewhere classified: Secondary | ICD-10-CM | POA: Diagnosis not present

## 2021-06-19 LAB — PSA: PSA: 1.58 ng/mL (ref <=4.00)

## 2021-06-24 DIAGNOSIS — M25551 Pain in right hip: Secondary | ICD-10-CM | POA: Diagnosis not present

## 2021-06-24 DIAGNOSIS — R262 Difficulty in walking, not elsewhere classified: Secondary | ICD-10-CM | POA: Diagnosis not present

## 2021-06-24 DIAGNOSIS — M25651 Stiffness of right hip, not elsewhere classified: Secondary | ICD-10-CM | POA: Diagnosis not present

## 2021-06-26 DIAGNOSIS — R262 Difficulty in walking, not elsewhere classified: Secondary | ICD-10-CM | POA: Diagnosis not present

## 2021-06-26 DIAGNOSIS — M25551 Pain in right hip: Secondary | ICD-10-CM | POA: Diagnosis not present

## 2021-06-26 DIAGNOSIS — M25651 Stiffness of right hip, not elsewhere classified: Secondary | ICD-10-CM | POA: Diagnosis not present

## 2021-06-30 ENCOUNTER — Other Ambulatory Visit: Payer: Self-pay

## 2021-07-01 ENCOUNTER — Encounter: Payer: Self-pay | Admitting: Family Medicine

## 2021-07-01 ENCOUNTER — Ambulatory Visit (INDEPENDENT_AMBULATORY_CARE_PROVIDER_SITE_OTHER): Payer: Medicare HMO | Admitting: Family Medicine

## 2021-07-01 VITALS — BP 100/63 | HR 72 | Temp 97.5°F | Ht 73.0 in | Wt 210.4 lb

## 2021-07-01 DIAGNOSIS — M25551 Pain in right hip: Secondary | ICD-10-CM

## 2021-07-01 DIAGNOSIS — R262 Difficulty in walking, not elsewhere classified: Secondary | ICD-10-CM | POA: Diagnosis not present

## 2021-07-01 DIAGNOSIS — M1611 Unilateral primary osteoarthritis, right hip: Secondary | ICD-10-CM

## 2021-07-01 DIAGNOSIS — M25651 Stiffness of right hip, not elsewhere classified: Secondary | ICD-10-CM | POA: Diagnosis not present

## 2021-07-01 NOTE — Progress Notes (Signed)
OFFICE VISIT  07/01/2021  CC:  Chief Complaint  Patient presents with   Follow-up    Hip pain; pt states aching has decreased and overall pain has improved since getting injection. Has been going to PT which helps as well   HPI:    Patient is a 70 y.o. male who presents for 2 wk f/u R hip pain. At last visit I did intraarticular steroid injection into R hip joint.  INTERIM HX: Gavin [name] says he got almost complete relief only a couple hours after the injection.  No more aching at all.  He sleeps well.  Ambulation does not cause any pain. Has been continuing with PT.  Really the only time he hurts some is with any significant amount of internal rotation, and at the maximum of external rotation.  Location of the pain is right anterior hip region.  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   Cataract    Bilat: has lens implants bilat.   Clotting disorder ([place]) 2004   COVID-19 virus infection 04/05/2021   Depression    Family history of colon cancer    Brother: in his 58s.   Hemorrhoids    History of fusion of cervical spine    History of left lateral epicondylitis    History of retinal detachment    OU   Hyperlipidemia    Migraines    Myocardial infarct ([place])    2004, when pt's stent clotted.  He got CABG [facility] at that time.   Prediabetes    04/2019-->HbA1c 5.9% (unchanged compared to 2/3 yrs prior). 5.8% 11/2019  6.2% 05/2020. A1c 5.7% 11/2020.   Pseudophakia    RBBB     Past Surgical History:  Procedure Laterality Date   CARDIOVASCULAR STRESS TEST  2013   No ischemia.  EF 70%.   CATARACT EXTRACTION, BILATERAL     with lens implants   CERVICAL FUSION  2006   C5/6   CHOLECYSTECTOMY  2004   COLONOSCOPY  09/19/2010; 11/02/13; 04/28/18   2012: repeat 3 yrs,adenomas.  2015 same.  2019  same.  2022 same->Recall 3-5 yrs.   CORONARY ARTERY BYPASS GRAFT  2004   CABGx4   CORONARY STENT PLACEMENT     BMS in 1990s.   POLYPECTOMY     RETINAL DETACHMENT SURGERY N/A    2011 & 2013 both eyes affected   TONSILLECTOMY  1956    Outpatient Medications Prior to Visit  Medication Sig Dispense Refill   aspirin EC 81 MG tablet Take 81 mg by mouth daily.     atorvastatin (LIPITOR) 40 MG tablet Take 1 tablet (40 mg total) by mouth daily. 90 tablet 1   Cholecalciferol (VITAMIN D PO) Take 1 capsule by mouth daily.     Coenzyme Q10 (COQ10 PO) Take 300 mg by mouth daily.     gabapentin (NEURONTIN) 100 MG capsule Take 1 capsule (100 mg total) by mouth 3 (three) times daily. 270 capsule 3   hydrocortisone (ANUSOL-HC) 2.5 % rectal cream Place 1 application rectally 2 (two) times daily. (Patient not taking: Reported on 06/04/2021) 30 g 3   No facility-administered medications prior to visit.    Allergies  Allergen Reactions   Penicillins Anaphylaxis    ROS As per HPI  PE: Vitals with BMI 07/01/2021 06/17/2021 05/23/2021  Height 6\' 1"  6\' 1"  6' 0.5"  Weight 210 lbs 6 oz 214 lbs 3 oz 215 lbs 6 oz  BMI 27.76 16.10 96.0  Systolic 454 098 119  Diastolic 63 80 70  Pulse 72 73 77    Gen: Alert, well appearing.  Patient is oriented to person, place, time, and situation. AFFECT: pleasant, lucid thought and speech. R hip nontender. Flexion to 120 deg before having some pain.  He can't IR past midline d/t anterior R hip pain. Extension full w/out pain.  No pain with resisted adduction or abduction. Strength 5/5 prox and distal R leg.  LABS:  Lab Results  Component Value Date   TSH 3.17 06/10/2020   Lab Results  Component Value Date   WBC 8.2 05/23/2021   HGB 14.4 05/23/2021   HCT 43.2 05/23/2021   MCV 86.4 05/23/2021   PLT 248.0 05/23/2021   Lab Results  Component Value Date   CREATININE 1.01 05/23/2021   BUN 17 05/23/2021   NA 143 05/23/2021   K 4.2 05/23/2021   CL 106  05/23/2021   CO2 29 05/23/2021   Lab Results  Component Value Date   ALT 23 12/09/2020   AST 23 12/09/2020   ALKPHOS 77 02/22/2018   BILITOT 1.3 (H) 12/09/2020   Lab Results  Component Value Date   CHOL 104 06/17/2021   Lab Results  Component Value Date   HDL 45 06/17/2021   Lab Results  Component Value Date   LDLCALC 40 06/17/2021   Lab Results  Component Value Date   TRIG 111 06/17/2021   Lab Results  Component Value Date   CHOLHDL 2.3 06/17/2021   Lab Results  Component Value Date   PSA 1.58 06/19/2021   PSA 1.51 06/10/2020   PSA 1.46 05/19/2019   Lab Results  Component Value Date   HGBA1C 5.9 (H) 06/17/2021   IMPRESSION AND PLAN:  Right hip osteoarthritis.  Great response to corticosteroid injection in the right hip joint. Still with some range of motion limitation.  May have a component of iliopsoas pain.  Overall doing great and he will finish up PT soon.  He knows the symptoms to watch for and will get back with me if his hip pain returns to a significant degree.  If it does return and assuming there is no suspicion of something new going on, he wants to proceed with another injection.  Minimum time between injections discussed today--3 months.  Also discussed orthopedic referral in future and he understands he may ultimately need this but wants to hold off for now.  An After Visit Summary was printed and given to the patient.  FOLLOW UP: Return if symptoms worsen or fail to improve.  Next cpe 06/2022  Signed:  Crissie Sickles, MD           07/01/2021

## 2021-07-03 DIAGNOSIS — M25551 Pain in right hip: Secondary | ICD-10-CM | POA: Diagnosis not present

## 2021-07-03 DIAGNOSIS — M25651 Stiffness of right hip, not elsewhere classified: Secondary | ICD-10-CM | POA: Diagnosis not present

## 2021-07-03 DIAGNOSIS — R262 Difficulty in walking, not elsewhere classified: Secondary | ICD-10-CM | POA: Diagnosis not present

## 2021-07-23 DIAGNOSIS — H5702 Anisocoria: Secondary | ICD-10-CM | POA: Diagnosis not present

## 2021-07-23 DIAGNOSIS — Z961 Presence of intraocular lens: Secondary | ICD-10-CM | POA: Diagnosis not present

## 2021-07-23 DIAGNOSIS — H26491 Other secondary cataract, right eye: Secondary | ICD-10-CM | POA: Diagnosis not present

## 2021-07-23 DIAGNOSIS — H31093 Other chorioretinal scars, bilateral: Secondary | ICD-10-CM | POA: Diagnosis not present

## 2021-10-28 ENCOUNTER — Ambulatory Visit (INDEPENDENT_AMBULATORY_CARE_PROVIDER_SITE_OTHER): Payer: Medicare HMO | Admitting: Family Medicine

## 2021-10-28 ENCOUNTER — Encounter: Payer: Self-pay | Admitting: Family Medicine

## 2021-10-28 VITALS — BP 117/64 | HR 84 | Temp 98.6°F | Ht 73.0 in | Wt 210.0 lb

## 2021-10-28 DIAGNOSIS — Z23 Encounter for immunization: Secondary | ICD-10-CM

## 2021-10-28 DIAGNOSIS — S61218A Laceration without foreign body of other finger without damage to nail, initial encounter: Secondary | ICD-10-CM | POA: Diagnosis not present

## 2021-10-28 NOTE — Progress Notes (Signed)
? ? ? ?This visit occurred during the SARS-CoV-2 public health emergency.  Safety protocols were in place, including screening questions prior to the visit, additional usage of staff PPE, and extensive cleaning of exam room while observing appropriate contact time as indicated for disinfecting solutions.  ? ? ?Gavin [name] , Sep 19, 1950, 71 y.o., male ?MRN: [id number] ?Patient Care Team  ?  Relationship Specialty Notifications Start End  ?Tammi Sou, MD PCP - General Family Medicine  02/14/18   ?Lorretta Harp, MD PCP - Cardiology Cardiology Admissions 12/13/19   ?Lorretta Harp, MD Consulting Physician Cardiology  03/02/18   ?Jerene Bears, MD Consulting Physician Gastroenterology  05/11/18   ? ? ?Chief Complaint  ?Patient presents with  ? Laceration  ?  Pt reports cut on R 5th digit 2 hours ago; tdap UTD 2017  ? ?  ?Subjective: Pt presents for an OV with complaints of right fifth finger pain after injury that occurred earlier today.  He reports he was pulling up shrub roots and a shrub dug into his finger causing a horizontal laceration across his right fifth finger distal pad.  He states that it bled quite a bit and he ran under cold water and applied pressure and since has not been bleeding.  He is uncertain when his last tetanus shot is and wanted to make sure he did not need stitches today. ?Tetanus is just over 27 years old. ? ? ?  06/04/2021  ?  1:08 PM 05/23/2021  ?  9:23 AM 05/29/2020  ?  1:38 PM 04/26/2020  ? 11:20 AM 01/29/2020  ?  8:05 AM  ?Depression screen PHQ 2/9  ?Decreased Interest 0 0 0 0 0  ?Down, Depressed, Hopeless 0 0 0 0 0  ?PHQ - 2 Score 0 0 0 0 0  ?Altered sleeping     0  ?Tired, decreased energy     0  ?Change in appetite     0  ?Feeling bad or failure about yourself      0  ?Trouble concentrating     0  ?Moving slowly or fidgety/restless     0  ?Suicidal thoughts     0  ?PHQ-9 Score     0  ?Difficult doing work/chores     Not difficult at all  ? ? ?Allergies  ?Allergen  Reactions  ? Penicillins Anaphylaxis  ? ?Social History  ? ?Social History Narrative  ? Married, 2 grown children.  No grandchildren.  ? Masters Degree: Marketing executive.  ? Metallurgist; retired Chief Financial Officer.  ? Alc: 2 drinks per week.  ? No tob.  ? ?Past Medical History:  ?Diagnosis Date  ? Allergic rhinitis   ? Arthritis   ? age related   ? CAD (coronary artery disease)   ? CABG x 4 2004 [facility]).  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  ? Cataract   ? Bilat: has lens implants bilat.  ? Clotting disorder ([place]) 2004  ? COVID-19 virus infection 04/05/2021  ? Depression   ? Family history of colon cancer   ? Brother: in his 93s.  ? Hemorrhoids   ? History of fusion of cervical spine   ? History of left lateral epicondylitis   ? History of retinal detachment   ? OU  ? Hyperlipidemia   ? Migraines   ? Myocardial infarct ([place])   ? 2004, when pt's stent clotted.  He got CABG at [facility] at that time.  ?  Prediabetes   ? 04/2019-->HbA1c 5.9% (unchanged compared to 2/3 yrs prior). 5.8% 11/2019  6.2% 05/2020. A1c 5.7% 11/2020.  ? Pseudophakia   ? RBBB   ? ?Past Surgical History:  ?Procedure Laterality Date  ? CARDIOVASCULAR STRESS TEST  2013  ? No ischemia.  EF 70%.  ? CATARACT EXTRACTION, BILATERAL    ? with lens implants  ? CERVICAL FUSION  2006  ? C5/6  ? CHOLECYSTECTOMY  2004  ? COLONOSCOPY  09/19/2010; 11/02/13; 04/28/18  ? 2012: repeat 3 yrs,adenomas.  2015 same.  2019 same.  2022 same->Recall 3-5 yrs.  ? CORONARY ARTERY BYPASS GRAFT  2004  ? CABGx4  ? CORONARY STENT PLACEMENT    ? BMS in 1990s.  ? POLYPECTOMY    ? RETINAL DETACHMENT SURGERY N/A   ? 2011 & 2013 both eyes affected  ? TONSILLECTOMY  1956  ? ?Family History  ?Problem Relation Age of Onset  ? Arthritis Mother   ? Diabetes Mother   ? Osteoporosis Mother   ? Depression Father   ? Heart attack Father   ? Heart disease Father   ? Hyperlipidemia Father   ? Hypertension Father   ? Stroke Father   ? Aortic aneurysm Father   ? Cancer Brother   ?  Heart disease Brother   ? Hyperlipidemia Brother   ? Hypertension Brother   ? Colon cancer Brother 73  ? Colon polyps Brother   ? Colon polyps Brother   ? Colon cancer Brother   ? Diabetes Maternal Grandmother   ? Early death Paternal Grandfather   ? Esophageal cancer Neg Hx   ? Rectal cancer Neg Hx   ? Stomach cancer Neg Hx   ? ?Allergies as of 10/28/2021   ? ?   Reactions  ? Penicillins Anaphylaxis  ? ?  ? ?  ?Medication List  ?  ? ?  ? Accurate as of October 28, 2021  5:03 PM. If you have any questions, ask your nurse or doctor.  ?  ?  ? ?  ? ?aspirin EC 81 MG tablet ?Take 81 mg by mouth daily. ?  ?atorvastatin 40 MG tablet ?Commonly known as: LIPITOR ?Take 1 tablet (40 mg total) by mouth daily. ?  ?COQ10 PO ?Take 300 mg by mouth daily. ?  ?gabapentin 100 MG capsule ?Commonly known as: NEURONTIN ?Take 1 capsule (100 mg total) by mouth 3 (three) times daily. ?  ?hydrocortisone 2.5 % rectal cream ?Commonly known as: Anusol-HC ?Place 1 application rectally 2 (two) times daily. ?  ?VITAMIN D PO ?Take 1 capsule by mouth daily. ?  ? ?  ? ? ?All past medical history, surgical history, allergies, family history, immunizations andmedications were updated in the EMR today and reviewed under the history and medication portions of their EMR.    ? ?ROS ?Negative, with the exception of above mentioned in HPI ? ? ?Objective:  ?BP 117/64   Pulse 84   Temp 98.6 ?F (37 ?C) (Oral)   Ht '6\' 1"'$  (1.854 m)   Wt 210 lb (95.3 kg)   SpO2 97%   BMI 27.71 kg/m?  ?Body mass index is 27.71 kg/m?Marland Kitchen ?Physical Exam ?Vitals and nursing note reviewed.  ?Constitutional:   ?   General: He is not in acute distress. ?   Appearance: Normal appearance. He is not ill-appearing, toxic-appearing or diaphoretic.  ?HENT:  ?   Head: Normocephalic and atraumatic.  ?Eyes:  ?   General: No scleral icterus.    ?  Right eye: No discharge.     ?   Left eye: No discharge.  ?   Extraocular Movements: Extraocular movements intact.  ?   Pupils: Pupils are equal,  round, and reactive to light.  ?Skin: ?   General: Skin is warm and dry.  ?   Comments: Right fifth finger: No redness, no drainage, no bleeding.  Approximately 1 inch superficial horizontal laceration across distal finger pad to subq layer, but not through. ?  ?Neurological:  ?   Mental Status: He is alert and oriented to person, place, and time. Mental status is at baseline.  ?Psychiatric:     ?   Mood and Affect: Mood normal.     ?   Behavior: Behavior normal.     ?   Thought Content: Thought content normal.     ?   Judgment: Judgment normal.  ? ? ?No results found. ?No results found. ?No results found for this or any previous visit (from the past 24 hour(s)). ? ?Assessment/Plan: ?Amerigo Mcglory is a 71 y.o. male present for OV for  ?Laceration of little finger without foreign body without damage to nail, unspecified laterality, initial encounter ?Laceration was soaked in sterile saline and peroxide.  Wound inspected underneath magnification and does not appear to contain any foreign bodies.  There is no bleeding or drainage. ?Applied Steri-Strip application.  Patient was advised to monitor for any redness, swelling, drainage and to be seen immediately if occurs.  Patient was encouraged to let Steri-Strips fall off naturally without pulling. ?Patient was encouraged to avoid placing hands in standing water, until completely healed. ?Tetanus was updated today. ?- Td vaccine greater than or equal to 7yo preservative free IM ?Follow-up if needed ? ?Reviewed expectations re: course of current medical issues. ?Discussed self-management of symptoms. ?Outlined signs and symptoms indicating need for more acute intervention. ?Patient verbalized understanding and all questions were answered. ?Patient received an After-Visit Summary. ? ? ? ?Orders Placed This Encounter  ?Procedures  ? Td vaccine greater than or equal to 7yo preservative free IM  ? ?No orders of the defined types were placed in this  encounter. ? ?Referral Orders  ?No referral(s) requested today  ? ? ? ?Note is dictated utilizing voice recognition software. Although note has been proof read prior to signing, occasional typographical errors still can be missed. If any

## 2021-10-28 NOTE — Patient Instructions (Addendum)
Keep area clean- no standing water until steri-strip falls off.  ? ?Follow up if needed- redness, swelling or drainage ?

## 2021-11-17 ENCOUNTER — Ambulatory Visit (INDEPENDENT_AMBULATORY_CARE_PROVIDER_SITE_OTHER): Payer: Medicare HMO | Admitting: Family Medicine

## 2021-11-17 ENCOUNTER — Encounter: Payer: Self-pay | Admitting: Family Medicine

## 2021-11-17 VITALS — BP 100/64 | HR 65 | Temp 98.0°F | Ht 73.0 in | Wt 205.8 lb

## 2021-11-17 DIAGNOSIS — Z0000 Encounter for general adult medical examination without abnormal findings: Secondary | ICD-10-CM

## 2021-11-17 DIAGNOSIS — G4700 Insomnia, unspecified: Secondary | ICD-10-CM | POA: Diagnosis not present

## 2021-11-17 DIAGNOSIS — E7800 Pure hypercholesterolemia, unspecified: Secondary | ICD-10-CM | POA: Diagnosis not present

## 2021-11-17 DIAGNOSIS — N138 Other obstructive and reflux uropathy: Secondary | ICD-10-CM

## 2021-11-17 DIAGNOSIS — N401 Enlarged prostate with lower urinary tract symptoms: Secondary | ICD-10-CM

## 2021-11-17 DIAGNOSIS — Z125 Encounter for screening for malignant neoplasm of prostate: Secondary | ICD-10-CM | POA: Diagnosis not present

## 2021-11-17 DIAGNOSIS — R252 Cramp and spasm: Secondary | ICD-10-CM | POA: Diagnosis not present

## 2021-11-17 DIAGNOSIS — R7303 Prediabetes: Secondary | ICD-10-CM

## 2021-11-17 DIAGNOSIS — R208 Other disturbances of skin sensation: Secondary | ICD-10-CM

## 2021-11-17 LAB — COMPREHENSIVE METABOLIC PANEL
ALT: 24 U/L (ref 0–53)
AST: 24 U/L (ref 0–37)
Albumin: 4.3 g/dL (ref 3.5–5.2)
Alkaline Phosphatase: 71 U/L (ref 39–117)
BUN: 19 mg/dL (ref 6–23)
CO2: 27 mEq/L (ref 19–32)
Calcium: 8.9 mg/dL (ref 8.4–10.5)
Chloride: 105 mEq/L (ref 96–112)
Creatinine, Ser: 1.04 mg/dL (ref 0.40–1.50)
GFR: 72.12 mL/min (ref >60.00)
Glucose, Bld: 113 mg/dL — ABNORMAL HIGH (ref 70–99)
Potassium: 4.6 mEq/L (ref 3.5–5.1)
Sodium: 141 mEq/L (ref 135–145)
Total Bilirubin: 1 mg/dL (ref 0.2–1.2)
Total Protein: 6.3 g/dL (ref 6.0–8.3)

## 2021-11-17 LAB — PSA, MEDICARE: PSA: 1.76 ng/ml (ref 0.10–4.00)

## 2021-11-17 MED ORDER — ATORVASTATIN CALCIUM 40 MG PO TABS: 40.0000 mg | ORAL_TABLET | Freq: Every day | ORAL | 3 refills | Status: DC

## 2021-11-17 MED ORDER — GABAPENTIN 100 MG PO CAPS: 100.0000 mg | ORAL_CAPSULE | Freq: Three times a day (TID) | ORAL | 1 refills | Status: DC

## 2021-11-17 NOTE — Progress Notes (Signed)
Office Note ?11/17/2021 ? ?CC:  ?Chief Complaint  ?Patient presents with  ? Hyperlipidemia  ?  Pt is fasting  ? Diabetes  ? ? ?HPI:  ?Patient is a 71 y.o. male who is here for annual health maintenance exam and follow-up hyperlipidemia and prediabetes. ? ?Overall pascals doing very well. ?He does have a few symptoms he like me to be aware of: ?He goes through some waxing and waning periods of urinary hesitancy and straining.  Some frequency during these times.  It was also burning a little bit recently.  Over the last 2 to 3 weeks the symptoms have nearly abated completely. ? ?He wakes up around 2 AM lately and finds it hard to go back to sleep.  He initiates sleep well. ?He has "pain all over" when in bed.  He rolls over and the pain goes away but then returns shortly after.  He says the pain is more of a burning type pain basically all over his body. ?When he gets up out of bed and moves around he is fine. ?He gets some muscle cramps in his hands and in his shoulders and diaphragm. ? ?Past Medical History:  ?Diagnosis Date  ? Allergic rhinitis   ? Arthritis   ? age related   ? CAD (coronary artery disease)   ? CABG x 4 2004 [facility]).  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  ? Cataract   ? Bilat: has lens implants bilat.  ? COVID-19 virus infection 04/05/2021  ? Depression   ? Family history of colon cancer   ? Brother: in his 67s.  ? Hemorrhoids   ? History of fusion of cervical spine   ? History of left lateral epicondylitis   ? History of retinal detachment   ? OU  ? Hyperlipidemia   ? Migraines   ? Myocardial infarct ([place])   ? 2004, when pt's stent clotted.  He got CABG at [facility] at that time.  ? Prediabetes   ? 04/2019-->HbA1c 5.9% (unchanged compared to 2/3 yrs prior). 5.8% 11/2019  6.2% 05/2020. A1c 5.7% 11/2020.  ? Pseudophakia   ? RBBB   ? ? ?Past Surgical History:  ?Procedure Laterality Date  ? CARDIOVASCULAR STRESS TEST  2013  ? No ischemia.  EF 70%.  ? CATARACT  EXTRACTION, BILATERAL    ? with lens implants  ? CERVICAL FUSION  2006  ? C5/6  ? CHOLECYSTECTOMY  2004  ? COLONOSCOPY  09/19/2010; 11/02/13; 04/28/18  ? 2012: repeat 3 yrs,adenomas.  2015 same.  2019 same.  2022 same->Recall 3-5 yrs.  ? CORONARY ARTERY BYPASS GRAFT  2004  ? CABGx4  ? CORONARY STENT PLACEMENT    ? BMS in 1990s.  ? POLYPECTOMY    ? RETINAL DETACHMENT SURGERY N/A   ? 2011 & 2013 both eyes affected  ? TONSILLECTOMY  1956  ? ? ?Family History  ?Problem Relation Age of Onset  ? Arthritis Mother   ? Diabetes Mother   ? Osteoporosis Mother   ? Depression Father   ? Heart attack Father   ? Heart disease Father   ? Hyperlipidemia Father   ? Hypertension Father   ? Stroke Father   ? Aortic aneurysm Father   ? Cancer Brother   ? Heart disease Brother   ? Hyperlipidemia Brother   ? Hypertension Brother   ? Colon cancer Brother 56  ? Colon polyps Brother   ? Colon polyps Brother   ?  Colon cancer Brother   ? Diabetes Maternal Grandmother   ? Early death Paternal Grandfather   ? Esophageal cancer Neg Hx   ? Rectal cancer Neg Hx   ? Stomach cancer Neg Hx   ? ? ?Social History  ? ?Socioeconomic History  ? Marital status: Married  ?  Spouse name: Not on file  ? Number of children: Not on file  ? Years of education: Not on file  ? Highest education level: Master's degree (e.g., MA, MS, MEng, MEd, MSW, MBA)  ?Occupational History  ? Not on file  ?Tobacco Use  ? Smoking status: Never  ? Smokeless tobacco: Never  ?Vaping Use  ? Vaping Use: Never used  ?Substance and Sexual Activity  ? Alcohol use: Yes  ?  Alcohol/week: 1.0 standard drink  ?  Types: 1 Glasses of wine per week  ?  Comment: glass of wine per week  ? Drug use: Never  ? Sexual activity: Yes  ?Other Topics Concern  ? Not on file  ?Social History Narrative  ? Married, 2 grown children.  No grandchildren.  ? Masters Degree: Marketing executive.  ? Metallurgist; retired Chief Financial Officer.  ? Alc: 2 drinks per week.  ? No tob.  ? ?Social Determinants of Health  ? ?Financial Resource Strain:  Low Risk   ? Difficulty of Paying Living Expenses: Not hard at all  ?Food Insecurity: No Food Insecurity  ? Worried About Charity fundraiser in the Last Year: Never true  ? Ran Out of Food in the Last Year: Never true  ?Transportation Needs: No Transportation Needs  ? Lack of Transportation (Medical): No  ? Lack of Transportation (Non-Medical): No  ?Physical Activity: Inactive  ? Days of Exercise per Week: 0 days  ? Minutes of Exercise per Session: 0 min  ?Stress: No Stress Concern Present  ? Feeling of Stress : Not at all  ?Social Connections: Moderately Isolated  ? Frequency of Communication with Friends and Family: More than three times a week  ? Frequency of Social Gatherings with Friends and Family: Twice a week  ? Attends Religious Services: Never  ? Active Member of Clubs or Organizations: No  ? Attends Archivist Meetings: Never  ? Marital Status: Married  ?Intimate Partner Violence: Not At Risk  ? Fear of Current or Ex-Partner: No  ? Emotionally Abused: No  ? Physically Abused: No  ? Sexually Abused: No  ? ? ?Outpatient Medications Prior to Visit  ?Medication Sig Dispense Refill  ? aspirin EC 81 MG tablet Take 81 mg by mouth daily.    ? Cholecalciferol (VITAMIN D PO) Take 1 capsule by mouth daily.    ? Coenzyme Q10 (COQ10 PO) Take 300 mg by mouth daily.    ? hydrocortisone (ANUSOL-HC) 2.5 % rectal cream Place 1 application rectally 2 (two) times daily. (Patient not taking: Reported on 11/17/2021) 30 g 3  ? atorvastatin (LIPITOR) 40 MG tablet Take 1 tablet (40 mg total) by mouth daily. 90 tablet 1  ? gabapentin (NEURONTIN) 100 MG capsule Take 1 capsule (100 mg total) by mouth 3 (three) times daily. 270 capsule 3  ? ?No facility-administered medications prior to visit.  ? ? ?Allergies  ?Allergen Reactions  ? Penicillins Anaphylaxis  ? ? ?ROS ?Review of Systems  ?Constitutional:  Negative for appetite change, chills, fatigue and fever.  ?HENT:  Negative for congestion, dental problem, ear pain and  sore throat.   ?Eyes:  Negative for discharge, redness and visual disturbance.  ?  Respiratory:  Negative for cough, chest tightness, shortness of breath and wheezing.   ?Cardiovascular:  Negative for chest pain, palpitations and leg swelling.  ?Gastrointestinal:  Negative for abdominal pain, blood in stool, diarrhea, nausea and vomiting.  ?Genitourinary:  Negative for difficulty urinating, dysuria, flank pain, frequency, hematuria and urgency.  ?Musculoskeletal:  Negative for arthralgias, back pain, joint swelling, myalgias and neck stiffness.  ?Skin:  Negative for pallor and rash.  ?Neurological:  Negative for dizziness, speech difficulty, weakness and headaches.  ?Hematological:  Negative for adenopathy. Does not bruise/bleed easily.  ?Psychiatric/Behavioral:  Negative for confusion and sleep disturbance. The patient is not nervous/anxious.   ?  ? ?PE; ? ?  11/17/2021  ?  8:13 AM 10/28/2021  ?  2:23 PM 07/01/2021  ?  9:27 AM  ?Vitals with BMI  ?Height '6\' 1"'$  '6\' 1"'$  '6\' 1"'$   ?Weight 205 lbs 13 oz 210 lbs 210 lbs 6 oz  ?BMI 27.16 27.71 27.76  ?Systolic 574 734 037  ?Diastolic 64 64 63  ?Pulse 65 84 72  ? ?Gen: Alert, well appearing.  Patient is oriented to person, place, time, and situation. ?AFFECT: pleasant, lucid thought and speech. ?ENT: Ears: EACs clear, normal epithelium.  TMs with good light reflex and landmarks bilaterally.  Eyes: no injection, icteris, swelling, or exudate.  EOMI, PERRLA. ?Nose: no drainage or turbinate edema/swelling.  No injection or focal lesion.  Mouth: lips without lesion/swelling.  Oral mucosa pink and moist.  Dentition intact and without obvious caries or gingival swelling.  Oropharynx without erythema, exudate, or swelling.  ?Neck: supple/nontender.  No LAD, mass, or TM.  Carotid pulses 2+ bilaterally, without bruits. ?CV: RRR, no m/r/g.   ?LUNGS: CTA bilat, nonlabored resps, good aeration in all lung fields. ?ABD: soft, NT, ND, BS normal.  No hepatospenomegaly or mass.  No bruits. ?EXT: no  clubbing, cyanosis, or edema.  ?Musculoskeletal: no joint swelling, erythema, warmth, or tenderness.  ROM of all joints intact. ?Skin - no sores or suspicious lesions or rashes or color changes ? ?Perti

## 2021-11-17 NOTE — Patient Instructions (Signed)
Buy otc magnesium oxide and take one '500mg'$  tab every evening. ?Also buy tonic water (any grocery store has this) and drink 3 oz every evening. ? ?Health Maintenance, Male ?Adopting a healthy lifestyle and getting preventive care are important in promoting health and wellness. Ask your health care provider about: ?The right schedule for you to have regular tests and exams. ?Things you can do on your own to prevent diseases and keep yourself healthy. ?What should I know about diet, weight, and exercise? ?Eat a healthy diet ? ?Eat a diet that includes plenty of vegetables, fruits, low-fat dairy products, and lean protein. ?Do not eat a lot of foods that are high in solid fats, added sugars, or sodium. ?Maintain a healthy weight ?Body mass index (BMI) is a measurement that can be used to identify possible weight problems. It estimates body fat based on height and weight. Your health care provider can help determine your BMI and help you achieve or maintain a healthy weight. ?Get regular exercise ?Get regular exercise. This is one of the most important things you can do for your health. Most adults should: ?Exercise for at least 150 minutes each week. The exercise should increase your heart rate and make you sweat (moderate-intensity exercise). ?Do strengthening exercises at least twice a week. This is in addition to the moderate-intensity exercise. ?Spend less time sitting. Even light physical activity can be beneficial. ?Watch cholesterol and blood lipids ?Have your blood tested for lipids and cholesterol at 72 years of age, then have this test every 5 years. ?You may need to have your cholesterol levels checked more often if: ?Your lipid or cholesterol levels are high. ?You are older than 72 years of age. ?You are at high risk for heart disease. ?What should I know about cancer screening? ?Many types of cancers can be detected early and may often be prevented. Depending on your health history and family history, you  may need to have cancer screening at various ages. This may include screening for: ?Colorectal cancer. ?Prostate cancer. ?Skin cancer. ?Lung cancer. ?What should I know about heart disease, diabetes, and high blood pressure? ?Blood pressure and heart disease ?High blood pressure causes heart disease and increases the risk of stroke. This is more likely to develop in people who have high blood pressure readings or are overweight. ?Talk with your health care provider about your target blood pressure readings. ?Have your blood pressure checked: ?Every 3-5 years if you are 80-7 years of age. ?Every year if you are 48 years old or older. ?If you are between the ages of 9 and 71 and are a current or former smoker, ask your health care provider if you should have a one-time screening for abdominal aortic aneurysm (AAA). ?Diabetes ?Have regular diabetes screenings. This checks your fasting blood sugar level. Have the screening done: ?Once every three years after age 16 if you are at a normal weight and have a low risk for diabetes. ?More often and at a younger age if you are overweight or have a high risk for diabetes. ?What should I know about preventing infection? ?Hepatitis B ?If you have a higher risk for hepatitis B, you should be screened for this virus. Talk with your health care provider to find out if you are at risk for hepatitis B infection. ?Hepatitis C ?Blood testing is recommended for: ?Everyone born from [birth year] through 1966. ?Anyone with known risk factors for hepatitis C. ?Sexually transmitted infections (STIs) ?You should be screened each year for STIs,  including gonorrhea and chlamydia, if: ?You are sexually active and are younger than 72 years of age. ?You are older than 72 years of age and your health care provider tells you that you are at risk for this type of infection. ?Your sexual activity has changed since you were last screened, and you are at increased risk for chlamydia or gonorrhea. Ask your  health care provider if you are at risk. ?Ask your health care provider about whether you are at high risk for HIV. Your health care provider may recommend a prescription medicine to help prevent HIV infection. If you choose to take medicine to prevent HIV, you should first get tested for HIV. You should then be tested every 3 months for as long as you are taking the medicine. ?Follow these instructions at home: ?Alcohol use ?Do not drink alcohol if your health care provider tells you not to drink. ?If you drink alcohol: ?Limit how much you have to 0-2 drinks a day. ?Know how much alcohol is in your drink. In the [place] one drink equals one 12 oz bottle of beer (355 mL), one 5 oz glass of wine (148 mL), or one 1? oz glass of hard liquor (44 mL). ?Lifestyle ?Do not use any products that contain nicotine or tobacco. These products include cigarettes, chewing tobacco, and vaping devices, such as e-cigarettes. If you need help quitting, ask your health care provider. ?Do not use street drugs. ?Do not share needles. ?Ask your health care provider for help if you need support or information about quitting drugs. ?General instructions ?Schedule regular health, dental, and eye exams. ?Stay current with your vaccines. ?Tell your health care provider if: ?You often feel depressed. ?You have ever been abused or do not feel safe at home. ?Summary ?Adopting a healthy lifestyle and getting preventive care are important in promoting health and wellness. ?Follow your health care provider's instructions about healthy diet, exercising, and getting tested or screened for diseases. ?Follow your health care provider's instructions on monitoring your cholesterol and blood pressure. ?This information is not intended to replace advice given to you by your health care provider. Make sure you discuss any questions you have with your health care provider. ?Document Revised: 11/25/2020 Document Reviewed: 11/25/2020 ?Elsevier Patient Education ?  [place]. ? ?

## 2021-11-18 DIAGNOSIS — E663 Overweight: Secondary | ICD-10-CM | POA: Diagnosis not present

## 2021-11-18 DIAGNOSIS — G629 Polyneuropathy, unspecified: Secondary | ICD-10-CM | POA: Diagnosis not present

## 2021-11-18 DIAGNOSIS — Z6827 Body mass index (BMI) 27.0-27.9, adult: Secondary | ICD-10-CM | POA: Diagnosis not present

## 2021-11-18 DIAGNOSIS — Z7982 Long term (current) use of aspirin: Secondary | ICD-10-CM | POA: Diagnosis not present

## 2021-11-18 DIAGNOSIS — N529 Male erectile dysfunction, unspecified: Secondary | ICD-10-CM | POA: Diagnosis not present

## 2021-11-18 DIAGNOSIS — I2510 Atherosclerotic heart disease of native coronary artery without angina pectoris: Secondary | ICD-10-CM | POA: Diagnosis not present

## 2021-11-18 DIAGNOSIS — Z880 Allergy status to penicillin: Secondary | ICD-10-CM | POA: Diagnosis not present

## 2021-11-18 DIAGNOSIS — K219 Gastro-esophageal reflux disease without esophagitis: Secondary | ICD-10-CM | POA: Diagnosis not present

## 2021-11-18 DIAGNOSIS — Z85828 Personal history of other malignant neoplasm of skin: Secondary | ICD-10-CM | POA: Diagnosis not present

## 2021-11-18 DIAGNOSIS — E785 Hyperlipidemia, unspecified: Secondary | ICD-10-CM | POA: Diagnosis not present

## 2021-11-18 DIAGNOSIS — R030 Elevated blood-pressure reading, without diagnosis of hypertension: Secondary | ICD-10-CM | POA: Diagnosis not present

## 2021-11-18 LAB — HEMOGLOBIN A1C
Hgb A1c MFr Bld: 5.9 % of total Hgb — ABNORMAL HIGH (ref <5.7)
Mean Plasma Glucose: 123 mg/dL
eAG (mmol/L): 6.8 mmol/L

## 2021-11-18 LAB — LIPID PANEL
Cholesterol: 103 mg/dL (ref <200)
HDL: 50 mg/dL (ref >=40)
LDL Cholesterol (Calc): 34 mg/dL (calc)
Non-HDL Cholesterol (Calc): 53 mg/dL (calc) (ref <130)
Total CHOL/HDL Ratio: 2.1 (calc) (ref <5.0)
Triglycerides: 105 mg/dL (ref <150)

## 2021-12-30 IMAGING — MG DIGITAL DIAGNOSTIC BILAT W/ TOMO W/ CAD
6 of 10 series · 6 of 30 positions shown · non-contrast
Comparison: None.

ACR Breast Density Category a: The breast tissue is almost entirely
fatty.

CLINICAL DATA: Small, linear shaped nodule felt by the patient in
the upper outer left breast since [REDACTED] or March 2020.

EXAM:
DIGITAL DIAGNOSTIC BILATERAL MAMMOGRAM WITH CAD
ULTRASOUND LEFT BREAST

[L CC synth-2D]
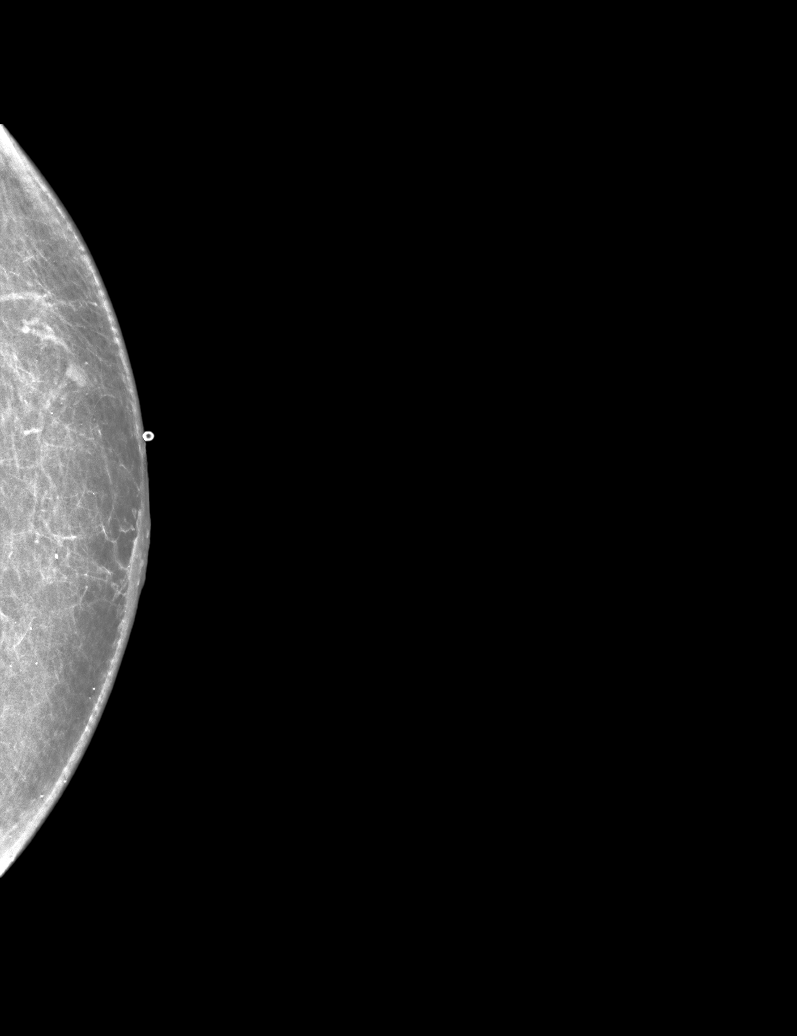

[L TAN synth-2D]
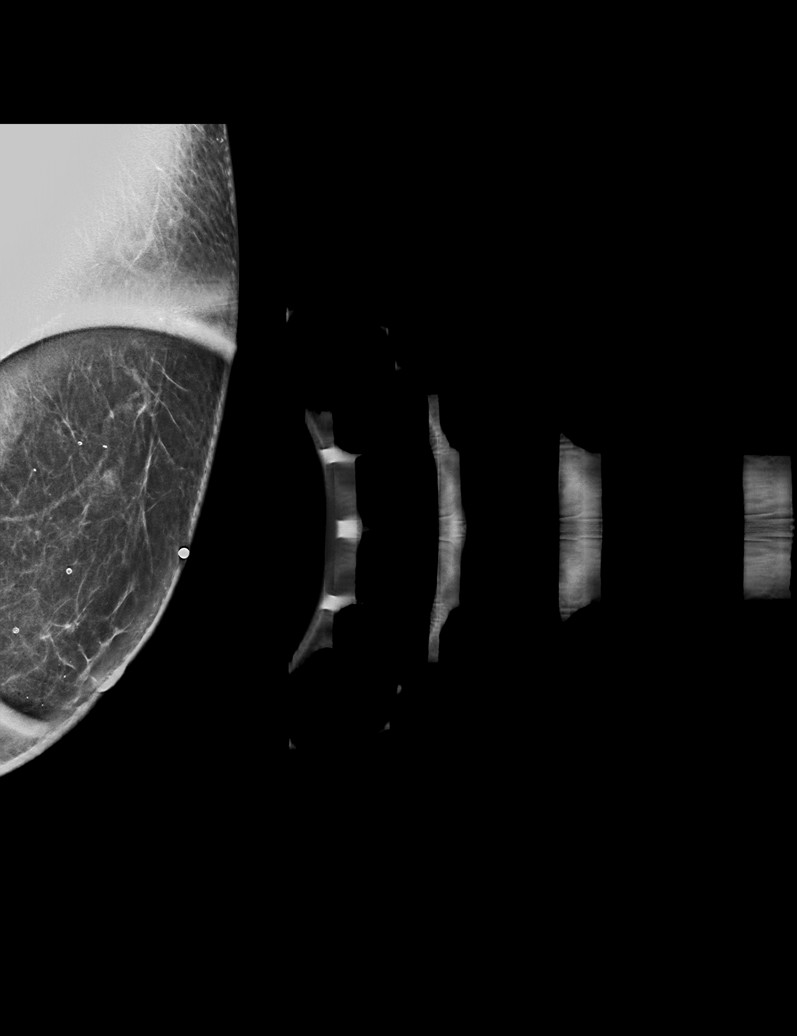

[R MLO synth-2D]
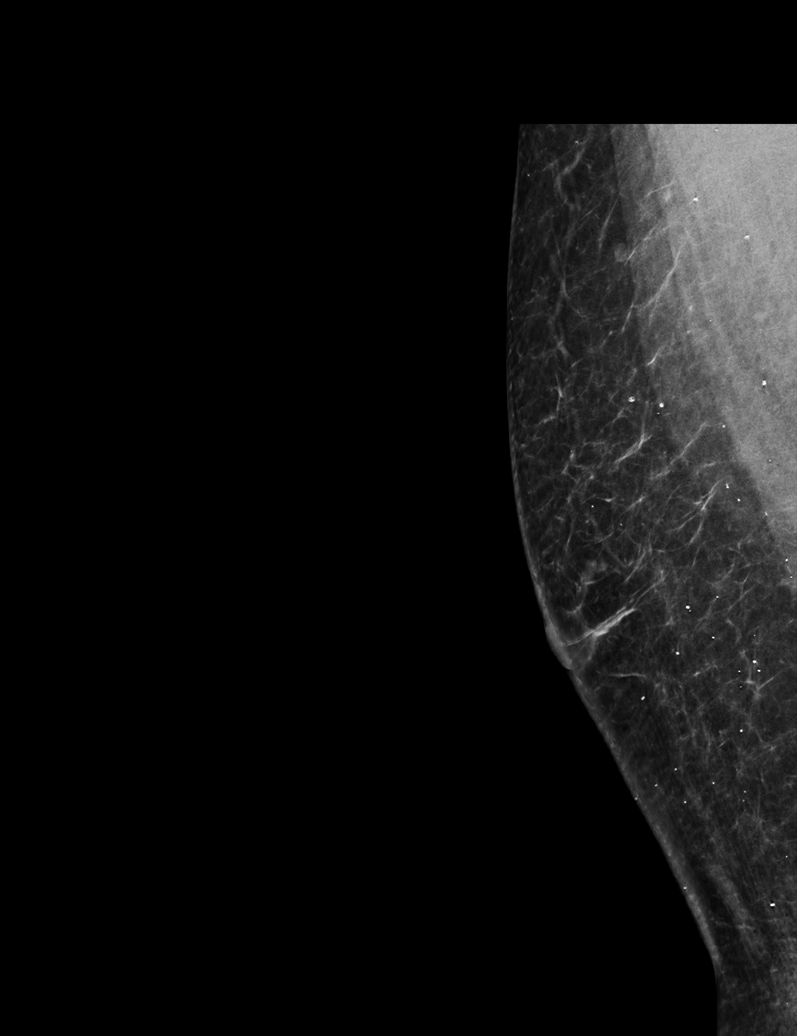

[R CC synth-2D]
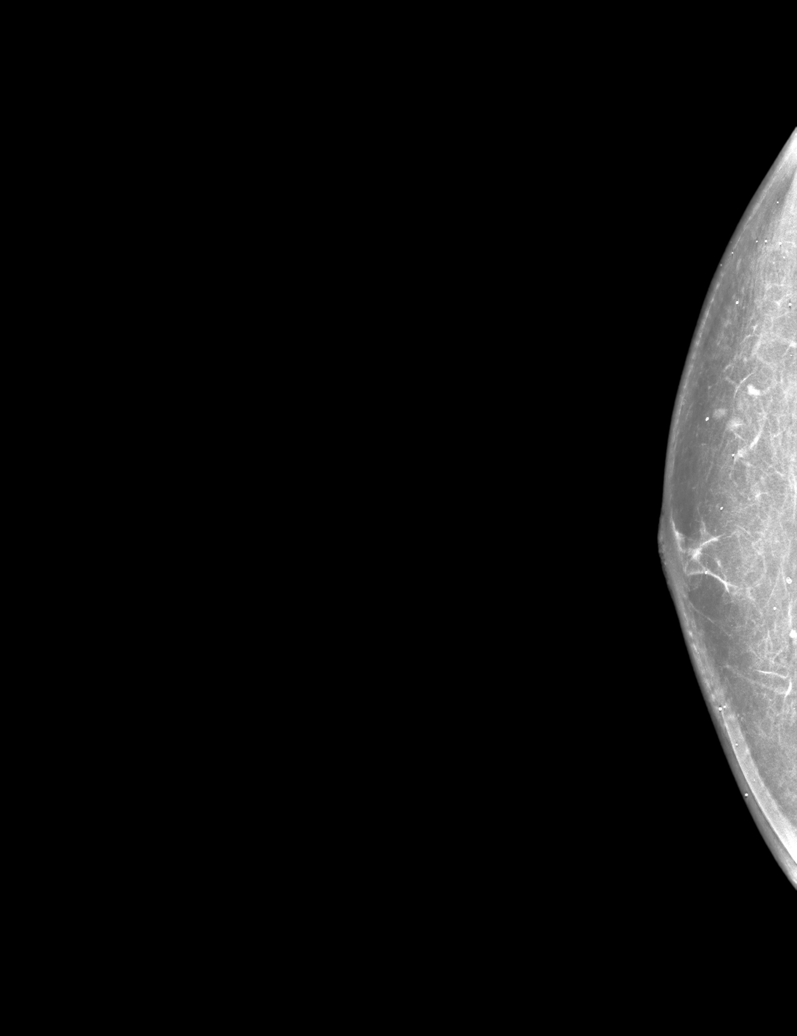

[L MLO synth-2D]
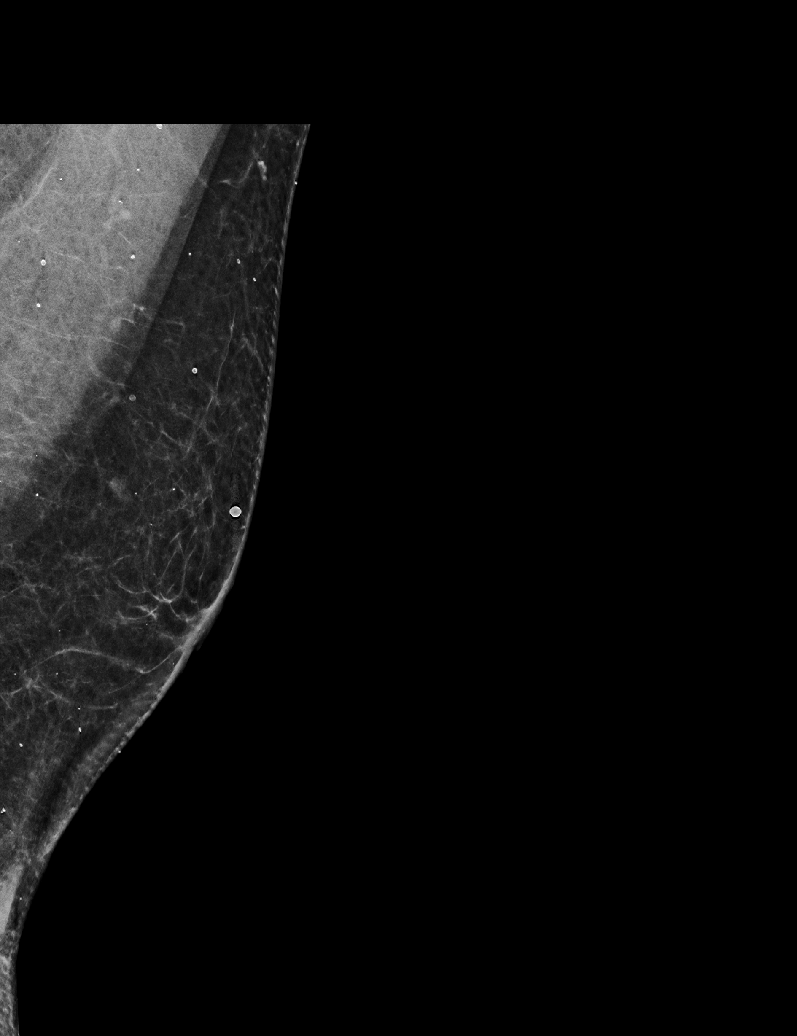

[L CC tomo · tomo slice 29/57.0]
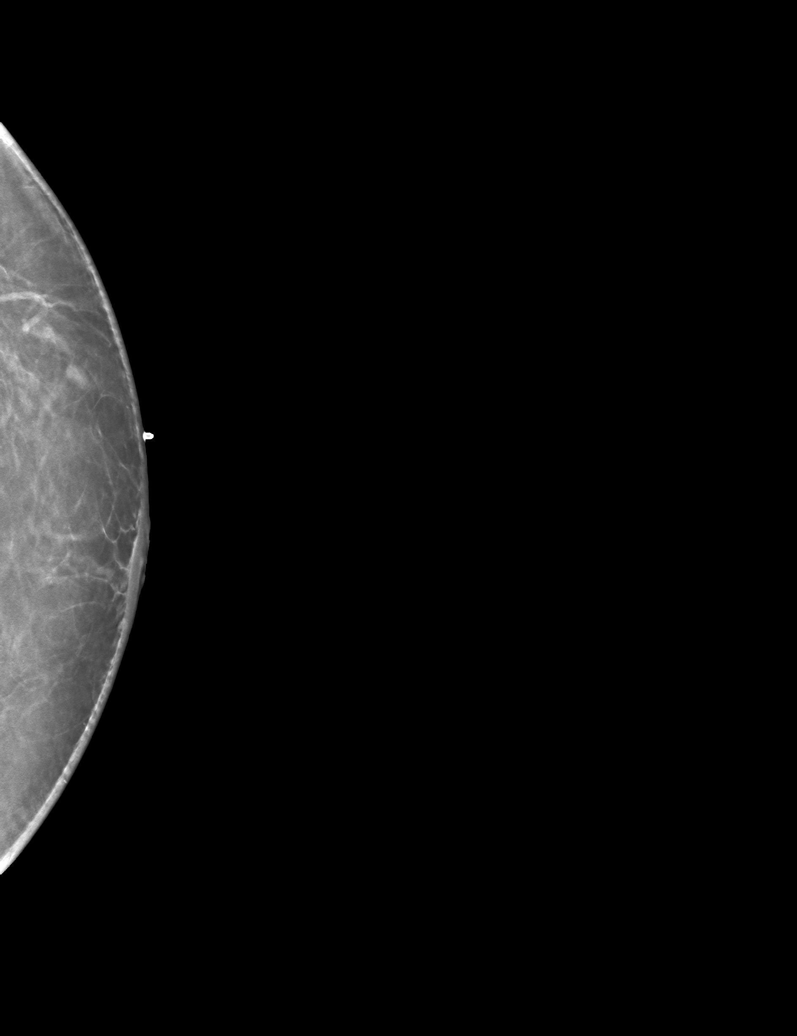

[6 of 30 positions shown; findings below may reference images not displayed]

FINDINGS: There is a small, oval, circumscribed mass in the upper outer left
breast in the vicinity of the small nodule felt by the patient. This
has mammographic features suggesting a normal intramammary lymph
node. There are no mammographic findings suspicious for malignancy
in either breast.

Mammographic images were processed with CAD.

On physical exam, and no mass is palpable in the area of patient
concern in the lateral left breast.

Targeted ultrasound is performed, showing normal appearing
subcutaneous fat lobules in the area patient concern in the lateral
left breast. There is also a 4 mm normal appearing lymph node in the
2 o'clock position of the left breast, 3 cm from the nipple. This
corresponds to the mass seen on the mammogram.
IMPRESSION: No evidence of malignancy.

RECOMMENDATION:
Clinical follow-up.

I have discussed the findings and recommendations with the patient.
If applicable, a reminder letter will be sent to the patient
regarding the next appointment.

BI-RADS CATEGORY  2: Benign.

## 2022-04-09 ENCOUNTER — Encounter: Payer: Self-pay | Admitting: Family Medicine

## 2022-04-17 ENCOUNTER — Encounter: Payer: Self-pay | Admitting: Family Medicine

## 2022-05-12 ENCOUNTER — Ambulatory Visit: Payer: Medicare HMO | Attending: Cardiovascular Disease | Admitting: Cardiovascular Disease

## 2022-05-12 ENCOUNTER — Encounter: Payer: Self-pay | Admitting: Cardiovascular Disease

## 2022-05-12 VITALS — BP 100/70 | HR 61 | Ht 73.0 in | Wt 215.0 lb

## 2022-05-12 DIAGNOSIS — I4510 Unspecified right bundle-branch block: Secondary | ICD-10-CM

## 2022-05-12 DIAGNOSIS — I2510 Atherosclerotic heart disease of native coronary artery without angina pectoris: Secondary | ICD-10-CM | POA: Diagnosis not present

## 2022-05-12 DIAGNOSIS — E782 Mixed hyperlipidemia: Secondary | ICD-10-CM

## 2022-05-12 NOTE — Progress Notes (Signed)
05/12/2022 Gavin [name]   1951/02/28  [id number]  Primary Physician McGowen, Adrian Blackwater, MD Primary Cardiologist: Lorretta Harp MD [name], [place]  HPI:  Gavin [name] is a 71 y.o.  mildly overweight married Caucasian male father 2 children their 92s (1 lives in [place] and 1 lives in [place]) referred by Dr. Anitra Lauth to be established in my practice for ongoing care because of known disease.  He is a retired Solicitor.  I last saw him in the office 03/14/2021.  His risk factors include treated hyperlipidemia as well as family history with a brother who had a myocardial infarction.  He had a stent placed in [place] work on his LAD in 1994.  He had coronary artery bypass grafting x4 in 2004 at [facility] after myocardial infarction.  He had a stress test along the way most recently at [facility] in 2013 which was nonischemic with an EF of 70%.     Prior to COVID-19 and the hot weather he was bicycling fairly frequently without symptoms.  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  He currently is flying gliders.   Since I saw him virtually a year ago he continues to do well.  He is still active.  He just came back from a camping trip where he went hiking for a week and was completely asymptomatic.  He denies chest pain or shortness of breath..   Current Meds  Medication Sig   aspirin EC 81 MG tablet Take 81 mg by mouth daily.   atorvastatin (LIPITOR) 40 MG tablet Take 1 tablet (40 mg total) by mouth daily.   Cholecalciferol (VITAMIN D PO) Take 1 capsule by mouth daily.   Coenzyme Q10 (COQ10 PO) Take 300 mg by mouth daily.   gabapentin (NEURONTIN) 100 MG capsule Take 1 capsule (100 mg total) by mouth 3 (three) times daily.   hydrocortisone (ANUSOL-HC) 2.5 % rectal cream Place 1 application rectally 2 (two) times daily.     Allergies  Allergen Reactions   Penicillins  Anaphylaxis    Social History   Socioeconomic History   Marital status: Married    Spouse name: Not on file   Number of children: Not on file   Years of education: Not on file   Highest education level: Master's degree (e.g., MA, MS, MEng, MEd, MSW, MBA)  Occupational History   Not on file  Tobacco Use   Smoking status: Never   Smokeless tobacco: Never  Vaping Use   Vaping Use: Never used  Substance and Sexual Activity   Alcohol use: Yes    Alcohol/week: 1.0 standard drink of alcohol    Types: 1 Glasses of wine per week    Comment: glass of wine per week   Drug use: Never   Sexual activity: Yes  Other Topics Concern   Not on file  Social History Narrative   Married, 2 grown children.  No grandchildren.   Masters Degree: Marketing executive.   [name]; retired Chief Financial Officer.   Alc: 2 drinks per week.   No tob.   Social Determinants of Health   Financial Resource Strain: Low Risk  (06/04/2021)   Overall Financial Resource Strain (CARDIA)    Difficulty of Paying Living Expenses: Not hard at all  Food Insecurity: No Food Insecurity (06/04/2021)   Hunger Vital Sign    Worried About Running Out of Food in the  Last Year: Never true    [place] in the Last Year: Never true  Transportation Needs: No Transportation Needs (06/04/2021)   PRAPARE - Hydrologist (Medical): No    Lack of Transportation (Non-Medical): No  Physical Activity: Inactive (06/04/2021)   Exercise Vital Sign    Days of Exercise per Week: 0 days    Minutes of Exercise per Session: 0 min  Stress: No Stress Concern Present (06/04/2021)   [place]    Feeling of Stress : Not at all  Recent Concern: Stress - Stress Concern Present (05/23/2021)   [place]    Feeling of Stress : To some extent  Social Connections: Moderately Isolated (06/04/2021)   Social  Connection and Isolation Panel [NHANES]    Frequency of Communication with Friends and Family: More than three times a week    Frequency of Social Gatherings with Friends and Family: Twice a week    Attends Religious Services: Never    Marine scientist or Organizations: No    Attends Archivist Meetings: Never    Marital Status: Married  Human resources officer Violence: Not At Risk (06/04/2021)   Humiliation, Afraid, Rape, and Kick questionnaire    Fear of Current or Ex-Partner: No    Emotionally Abused: No    Physically Abused: No    Sexually Abused: No     Review of Systems: General: negative for chills, fever, night sweats or weight changes.  Cardiovascular: negative for chest pain, dyspnea on exertion, edema, orthopnea, palpitations, paroxysmal nocturnal dyspnea or shortness of breath Dermatological: negative for rash Respiratory: negative for cough or wheezing Urologic: negative for hematuria Abdominal: negative for nausea, vomiting, diarrhea, bright red blood per rectum, melena, or hematemesis Neurologic: negative for visual changes, syncope, or dizziness All other systems reviewed and are otherwise negative except as noted above.    Blood pressure 100/70, pulse 61, height '6\' 1"'$  (1.854 m), weight 215 lb (97.5 kg), SpO2 97 %.  General appearance: alert and no distress Neck: no adenopathy, no carotid bruit, no JVD, supple, symmetrical, trachea midline, and thyroid not enlarged, symmetric, no tenderness/mass/nodules Lungs: clear to auscultation bilaterally Heart: regular rate and rhythm, S1, S2 normal, no murmur, click, rub or gallop Extremities: extremities normal, atraumatic, no cyanosis or edema Pulses: 2+ and symmetric Skin: Skin color, texture, turgor normal. No rashes or lesions Neurologic: Grossly normal  EKG sinus rhythm at 60 with right bundle branch block unchanged from prior EKGs.  Personally reviewed this EKG.  ASSESSMENT AND PLAN:    Hyperlipidemia History of hyperlipidemia on statin therapy with lipid profile performed 11/17/2021 revealing total cholesterol 103, LDL 34 and HDL of 50.  Coronary artery disease History of CAD status post LAD stenting back in 1994 in [place] and bypass grafting x4 at [facility] in 2004 after myocardial infarction.  He has had negative stress test along the way but none recently.  He is very active and is asymptomatic.  Right bundle branch block Chronic     Lorretta Harp MD [facility], [facility] 05/12/2022 10:12 AM

## 2022-05-12 NOTE — Patient Instructions (Signed)
Medication Instructions:  Your physician recommends that you continue on your current medications as directed. Please refer to the Current Medication list given to you today.  *If you need a refill on your cardiac medications before your next appointment, please call your pharmacy*   Follow-Up: At [facility], you and your health needs are our priority.  As part of our continuing mission to provide you with exceptional heart care, we have created designated Provider Care Teams.  These Care Teams include your primary Cardiologist (physician) and Advanced Practice Providers (APPs -  Physician Assistants and Nurse Practitioners) who all work together to provide you with the care you need, when you need it.  We recommend signing up for the patient portal called "MyChart".  Sign up information is provided on this After Visit Summary.  MyChart is used to connect with patients for Virtual Visits (Telemedicine).  Patients are able to view lab/test results, encounter notes, upcoming appointments, etc.  Non-urgent messages can be sent to your provider as well.   To learn more about what you can do with MyChart, go to [email or web address].    Your next appointment:   12 month(s)  The format for your next appointment:   In Person  Provider:   Jonathan Berry, MD   

## 2022-05-12 NOTE — Assessment & Plan Note (Signed)
Chronic. 

## 2022-05-12 NOTE — Assessment & Plan Note (Signed)
History of CAD status post LAD stenting back in 1994 in [place] and bypass grafting x4 at [facility] in 2004 after myocardial infarction.  He has had negative stress test along the way but none recently.  He is very active and is asymptomatic.

## 2022-05-12 NOTE — Assessment & Plan Note (Signed)
History of hyperlipidemia on statin therapy with lipid profile performed 11/17/2021 revealing total cholesterol 103, LDL 34 and HDL of 50.

## 2022-05-18 ENCOUNTER — Encounter: Payer: Self-pay | Admitting: Family Medicine

## 2022-05-18 MED ORDER — HYDROCORTISONE (PERIANAL) 2.5 % EX CREA: 1.0000 | TOPICAL_CREAM | Freq: Two times a day (BID) | CUTANEOUS | 3 refills | Status: DC

## 2022-05-18 NOTE — Telephone Encounter (Signed)
Okay, hydrocortisone cream prescription sent.

## 2022-05-20 ENCOUNTER — Ambulatory Visit: Payer: Medicare HMO | Admitting: Family Medicine

## 2022-05-21 ENCOUNTER — Encounter: Payer: Self-pay | Admitting: Family Medicine

## 2022-05-22 ENCOUNTER — Other Ambulatory Visit: Payer: Self-pay

## 2022-05-22 MED ORDER — HYDROCORTISONE (PERIANAL) 2.5 % EX CREA: 1.0000 | TOPICAL_CREAM | Freq: Two times a day (BID) | CUTANEOUS | 3 refills | Status: AC

## 2022-06-17 ENCOUNTER — Ambulatory Visit: Payer: Medicare HMO

## 2022-06-19 ENCOUNTER — Encounter: Payer: Self-pay | Admitting: Family Medicine

## 2022-06-19 ENCOUNTER — Ambulatory Visit (INDEPENDENT_AMBULATORY_CARE_PROVIDER_SITE_OTHER): Payer: Medicare HMO | Admitting: Family Medicine

## 2022-06-19 VITALS — BP 99/65 | HR 66 | Temp 97.7°F | Ht 72.0 in | Wt 214.0 lb

## 2022-06-19 DIAGNOSIS — I2510 Atherosclerotic heart disease of native coronary artery without angina pectoris: Secondary | ICD-10-CM | POA: Diagnosis not present

## 2022-06-19 DIAGNOSIS — E7800 Pure hypercholesterolemia, unspecified: Secondary | ICD-10-CM | POA: Diagnosis not present

## 2022-06-19 DIAGNOSIS — G6289 Other specified polyneuropathies: Secondary | ICD-10-CM

## 2022-06-19 DIAGNOSIS — N400 Enlarged prostate without lower urinary tract symptoms: Secondary | ICD-10-CM | POA: Diagnosis not present

## 2022-06-19 DIAGNOSIS — R7303 Prediabetes: Secondary | ICD-10-CM | POA: Diagnosis not present

## 2022-06-19 DIAGNOSIS — L570 Actinic keratosis: Secondary | ICD-10-CM | POA: Diagnosis not present

## 2022-06-19 MED ORDER — ATORVASTATIN CALCIUM 40 MG PO TABS: 40.0000 mg | ORAL_TABLET | Freq: Every day | ORAL | 3 refills | Status: DC

## 2022-06-19 NOTE — Progress Notes (Signed)
OFFICE VISIT  06/19/2022  CC:  Chief Complaint  Patient presents with   Annual Exam    Pt is fasting     Patient is a 71 y.o. male who presents for 95-monthfollow-up HLD, prediabetes, BPH. A/P as of last visit: "#1 prediabetes. Hemoglobin A1c today.  2.  Hyperlipidemia.  Doing well on atorvastatin 40 mg a day. Panel and hepatic panel today.  #3 dysesthesias.  Just when lying in bed.  Observation/monitor. 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  4. BPH, usually mild symptoms, possibly superimposed acute prostatitis recently. Resolving.  Observation/monitor.  He will call in the future if he feels like the symptoms returned significantly enough to request Flomax.   #5 muscle cramps. Recommended magnesium oxide 500 mg a day and tonic water 3 ounces a day.   #6 Health maintenance exam: Reviewed age and gender appropriate health maintenance issues (prudent diet, regular exercise, health risks of tobacco and excessive alcohol, use of seatbelts, fire alarms in home, use of sunscreen).  Also reviewed age and gender appropriate health screening as well as vaccine recommendations. Vaccines: all UTD Labs: Hba1c, lipids, cmet Prostate ca screening: PSA Colon ca screening: recall 04/2024"  INTERIM HX: Gavin [name] is feeling very well. Hips do not hurt him at all. He walks regularly for exercise.  He eats a low carbohydrate diet.  He shows me some very interesting astronomy pictures he takes with his telescope, extremely advanced and impressive.  He will having a bit of trouble with his external hemorrhoids, uses hydrocortisone cream. Still some mild obstructive voiding symptoms but says he is living with it fine.  He has some chronic numbness in all the toes but no pain.  Has long history of this.  Gabapentin no help.  ROS as above, plus--> no fevers, no CP, no SOB, no wheezing, no cough, no dizziness, no HAs, no  rashes, no melena/hematochezia.  No polyuria or polydipsia.  No myalgias or arthralgias.  No focal weakness, no tremors.  No acute vision or hearing abnormalities.  No dysuria  No recent changes in lower legs. No n/v/d or abd pain.  No palpitations.    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   Cataract    Bilat: has lens implants bilat.   COVID-19 virus infection 04/05/2021   Depression    Family history of colon cancer    Brother: in his 337s   Hemorrhoids    History of fusion of cervical spine    History of left lateral epicondylitis    History of retinal detachment    OU   Hyperlipidemia    Migraines    [place], when [place]'s stent clotted.  [facility]   Prediabetes    04/2019-->HbA1c 5.9% (unchanged compared to 2/3 yrs prior). 5.8% 11/2019  6.2% 05/2020. A1c 5.7% 11/2020.   Pseudophakia    RBBB     Past Surgical History:  Procedure Laterality Date   CARDIOVASCULAR STRESS TEST  2013   No ischemia.  EF 70%.   CATARACT EXTRACTION, BILATERAL     with lens implants   CERVICAL FUSION  2006  C5/6   CHOLECYSTECTOMY  2004   COLONOSCOPY  09/19/2010; 11/02/13; 04/28/18   2012: repeat 3 yrs,[place].  2015 same.  2019 same.  2022 same->Recall 3-5 yrs.   CORONARY ARTERY BYPASS GRAFT  2004   CABGx4   CORONARY STENT PLACEMENT     BMS in 1990s.   POLYPECTOMY     RETINAL DETACHMENT SURGERY N/A    2011 & 2013 both eyes affected   TONSILLECTOMY  1956    Outpatient Medications Prior to Visit  Medication Sig Dispense Refill   aspirin EC 81 MG tablet Take 81 mg by mouth daily.     Cholecalciferol (VITAMIN D PO) Take 1 capsule by mouth daily.     Coenzyme Q10 (COQ10 PO) Take 300 mg by mouth daily.     gabapentin (NEURONTIN) 100 MG capsule Take 1 capsule  (100 mg total) by mouth 3 (three) times daily. 270 capsule 1   hydrocortisone (ANUSOL-HC) 2.5 % rectal cream Place 1 Application rectally 2 (two) times daily. 30 g 3   atorvastatin (LIPITOR) 40 MG tablet Take 1 tablet (40 mg total) by mouth daily. 90 tablet 3   No facility-administered medications prior to visit.    Allergies  Allergen Reactions   Penicillins Anaphylaxis    ROS As per HPI  PE:    06/19/2022    8:25 AM 05/12/2022    9:49 AM 11/17/2021    8:13 AM  Vitals with BMI  Height '6\' 0"'$  '6\' 1"'$  '6\' 1"'$   Weight 214 lbs 215 lbs 205 lbs 13 oz  BMI 29.02 61.60 73.71  Systolic 99 062 694  Diastolic 65 70 64  Pulse 66 61 65     Physical Exam  Gen: Alert, well appearing.  Patient is oriented to person, place, time, and situation. AFFECT: pleasant, lucid thought and speech. No further exam today.  LABS:  Last CBC Lab Results  Component Value Date   WBC 8.2 05/23/2021   HGB 14.4 05/23/2021   HCT 43.2 05/23/2021   MCV 86.4 05/23/2021   MCH 29.1 06/10/2020   RDW 13.4 05/23/2021   PLT 248.0 85/46/2703   Last metabolic panel Lab Results  Component Value Date   GLUCOSE 113 (H) 11/17/2021   NA 141 11/17/2021   K 4.6 11/17/2021   CL 105 11/17/2021   CO2 27 11/17/2021   BUN 19 11/17/2021   CREATININE 1.04 11/17/2021   CALCIUM 8.9 11/17/2021   PROT 6.3 11/17/2021   ALBUMIN 4.3 11/17/2021   BILITOT 1.0 11/17/2021   ALKPHOS 71 11/17/2021   AST 24 11/17/2021   ALT 24 11/17/2021   Last lipids Lab Results  Component Value Date   CHOL 103 11/17/2021   HDL 50 11/17/2021   LDLCALC 34 11/17/2021   TRIG 105 11/17/2021   CHOLHDL 2.1 11/17/2021   Last hemoglobin A1c Lab Results  Component Value Date   HGBA1C 5.9 (H) 11/17/2021   Last thyroid functions Lab Results  Component Value Date   TSH 3.17 06/10/2020   Lab Results  Component Value Date   PSA 1.76 11/17/2021   PSA 1.58 06/19/2021   PSA 1.51 06/10/2020   IMPRESSION AND PLAN:  #1 hyperlipidemia, goal  LDL less than 70. Doing well on atorvastatin 40 mg a day. Lipid panel today.  #2 prediabetes.  Great diet. Hemoglobin A1c and fasting sugar today.  #3 diffuse toes numbness.  Neuropathy.  Gabapentin no help.  DC gabapentin.  He is tolerating this condition fine and says it is actually improved  over the last 1 to 2 months.  #4 BPH with mild obstructive symptoms.  Observe.  5 actinic keratoses.  History of left arm squamous cell carcinoma. His dermatologist retired. New dermatologist referral today per his request.  An After Visit Summary was printed and given to the patient.  FOLLOW UP: Return in about 6 months (around 12/19/2022) for annual CPE (fasting). Cpe 6 mo  Signed:  Crissie Sickles, MD           06/19/2022

## 2022-06-20 LAB — CBC
HCT: 48.6 % (ref 38.5–50.0)
Hemoglobin: 16.5 g/dL (ref 13.2–17.1)
MCH: 29.3 pg (ref 27.0–33.0)
MCHC: 34 g/dL (ref 32.0–36.0)
MCV: 86.3 fL (ref 80.0–100.0)
MPV: 10.1 fL (ref 7.5–12.5)
Platelets: 237 10*3/uL (ref 140–400)
RBC: 5.63 10*6/uL (ref 4.20–5.80)
RDW: 12.6 % (ref 11.0–15.0)
WBC: 7.2 10*3/uL (ref 3.8–10.8)

## 2022-06-20 LAB — BASIC METABOLIC PANEL
BUN: 17 mg/dL (ref 7–25)
CO2: 26 mmol/L (ref 20–32)
Calcium: 9.6 mg/dL (ref 8.6–10.3)
Chloride: 105 mmol/L (ref 98–110)
Creat: 1.07 mg/dL (ref 0.70–1.28)
Glucose, Bld: 115 mg/dL — ABNORMAL HIGH (ref 65–99)
Potassium: 5 mmol/L (ref 3.5–5.3)
Sodium: 140 mmol/L (ref 135–146)

## 2022-06-20 LAB — LIPID PANEL
Cholesterol: 119 mg/dL (ref <200)
HDL: 50 mg/dL (ref >=40)
LDL Cholesterol (Calc): 47 mg/dL (calc)
Non-HDL Cholesterol (Calc): 69 mg/dL (calc) (ref <130)
Total CHOL/HDL Ratio: 2.4 (calc) (ref <5.0)
Triglycerides: 132 mg/dL (ref <150)

## 2022-06-20 LAB — HEMOGLOBIN A1C
Hgb A1c MFr Bld: 6.2 % of total Hgb — ABNORMAL HIGH (ref <5.7)
Mean Plasma Glucose: 131 mg/dL
eAG (mmol/L): 7.3 mmol/L

## 2022-07-01 ENCOUNTER — Ambulatory Visit: Payer: Medicare HMO | Admitting: Family Medicine

## 2022-07-01 ENCOUNTER — Ambulatory Visit (INDEPENDENT_AMBULATORY_CARE_PROVIDER_SITE_OTHER): Payer: Medicare HMO

## 2022-07-01 DIAGNOSIS — Z0000 Encounter for general adult medical examination without abnormal findings: Secondary | ICD-10-CM

## 2022-07-01 NOTE — Patient Instructions (Signed)
Health Maintenance, Male Adopting a healthy lifestyle and getting preventive care are important in promoting health and wellness. Ask your health care provider about: The right schedule for you to have regular tests and exams. Things you can do on your own to prevent diseases and keep yourself healthy. What should I know about diet, weight, and exercise? Eat a healthy diet  Eat a diet that includes plenty of vegetables, fruits, low-fat dairy products, and lean protein. Do not eat a lot of foods that are high in solid fats, added sugars, or sodium. Maintain a healthy weight Body mass index (BMI) is a measurement that can be used to identify possible weight problems. It estimates body fat based on height and weight. Your health care provider can help determine your BMI and help you achieve or maintain a healthy weight. Get regular exercise Get regular exercise. This is one of the most important things you can do for your health. Most adults should: Exercise for at least 150 minutes each week. The exercise should increase your heart rate and make you sweat (moderate-intensity exercise). Do strengthening exercises at least twice a week. This is in addition to the moderate-intensity exercise. Spend less time sitting. Even light physical activity can be beneficial. Watch cholesterol and blood lipids Have your blood tested for lipids and cholesterol at 72 years of age, then have this test every 5 years. You may need to have your cholesterol levels checked more often if: Your lipid or cholesterol levels are high. You are older than 72 years of age. You are at high risk for heart disease. What should I know about cancer screening? Many types of cancers can be detected early and may often be prevented. Depending on your health history and family history, you may need to have cancer screening at various ages. This may include screening for: Colorectal cancer. Prostate cancer. Skin cancer. Lung  cancer. What should I know about heart disease, diabetes, and high blood pressure? Blood pressure and heart disease High blood pressure causes heart disease and increases the risk of stroke. This is more likely to develop in people who have high blood pressure readings or are overweight. Talk with your health care provider about your target blood pressure readings. Have your blood pressure checked: Every 3-5 years if you are 18-39 years of age. Every year if you are 40 years old or older. If you are between the ages of 65 and 75 and are a current or former smoker, ask your health care provider if you should have a one-time screening for abdominal aortic aneurysm (AAA). Diabetes Have regular diabetes screenings. This checks your fasting blood sugar level. Have the screening done: Once every three years after age 45 if you are at a normal weight and have a low risk for diabetes. More often and at a younger age if you are overweight or have a high risk for diabetes. What should I know about preventing infection? Hepatitis B If you have a higher risk for hepatitis B, you should be screened for this virus. Talk with your health care provider to find out if you are at risk for hepatitis B infection. Hepatitis C Blood testing is recommended for: Everyone born from 1945 through 1965. Anyone with known risk factors for hepatitis C. Sexually transmitted infections (STIs) You should be screened each year for STIs, including gonorrhea and chlamydia, if: You are sexually active and are younger than 72 years of age. You are older than 72 years of age and your   health care provider tells you that you are at risk for this type of infection. Your sexual activity has changed since you were last screened, and you are at increased risk for chlamydia or gonorrhea. Ask your health care provider if you are at risk. Ask your health care provider about whether you are at high risk for HIV. Your health care provider  may recommend a prescription medicine to help prevent HIV infection. If you choose to take medicine to prevent HIV, you should first get tested for HIV. You should then be tested every 3 months for as long as you are taking the medicine. Follow these instructions at home: Alcohol use Do not drink alcohol if your health care provider tells you not to drink. If you drink alcohol: Limit how much you have to 0-2 drinks a day. Know how much alcohol is in your drink. In the [place] one drink equals one 12 oz bottle of beer (355 mL), one 5 oz glass of wine (148 mL), or one 1 oz glass of hard liquor (44 mL). Lifestyle Do not use any products that contain nicotine or tobacco. These products include cigarettes, chewing tobacco, and vaping devices, such as e-cigarettes. If you need help quitting, ask your health care provider. Do not use street drugs. Do not share needles. Ask your health care provider for help if you need support or information about quitting drugs. General instructions Schedule regular health, dental, and eye exams. Stay current with your vaccines. Tell your health care provider if: You often feel depressed. You have ever been abused or do not feel safe at home. Summary Adopting a healthy lifestyle and getting preventive care are important in promoting health and wellness. Follow your health care provider's instructions about healthy diet, exercising, and getting tested or screened for diseases. Follow your health care provider's instructions on monitoring your cholesterol and blood pressure. This information is not intended to replace advice given to you by your health care provider. Make sure you discuss any questions you have with your health care provider. Document Revised: 11/25/2020 Document Reviewed: 11/25/2020 [employer] Patient Education  2023 [employer]  

## 2022-07-01 NOTE — Progress Notes (Signed)
Subjective:   Gavin [name] is a 71 y.o. male who presents for Medicare Annual/Subsequent preventive examination.  I connected with  Gavin [name] on 07/01/22 by an audio only telemedicine application and verified that I am speaking with the correct person using two identifiers.   I discussed the limitations, risks, security and privacy concerns of performing an evaluation and management service by telephone and the availability of in person appointments. I also discussed with the patient that there may be a patient responsible charge related to this service. The patient expressed understanding and verbally consented to this telephonic visit.  Location of Patient:home Location of Provider:office  List any persons and their role that are participating in the visit with the patient.   Gavin [name], CMA  Review of Systems    Defer to PCP Cardiac Risk Factors include: advanced age (>45mn, >>72women);dyslipidemia;male gender     Objective:    There were no vitals filed for this visit. There is no height or weight on file to calculate BMI.     07/01/2022    8:44 AM 06/04/2021    1:10 PM 05/29/2020    1:34 PM 05/02/2019   10:03 AM  Advanced Directives  Does Patient Have a Medical Advance Directive? No Yes No No  Does patient want to make changes to medical advance directive?  Yes (MAU/Ambulatory/Procedural Areas - Information given)    Would patient like information on creating a medical advance directive? No - Patient declined  No - Patient declined No - Patient declined    Current Medications (verified) Outpatient Encounter Medications as of 07/01/2022  Medication Sig   aspirin EC 81 MG tablet Take 81 mg by mouth daily.   atorvastatin (LIPITOR) 40 MG tablet Take 1 tablet (40 mg total) by mouth daily.   Cholecalciferol (VITAMIN D PO) Take 1 capsule by mouth daily.   Coenzyme Q10 (COQ10 PO) Take 300 mg by mouth daily.   gabapentin (NEURONTIN) 100  MG capsule Take 1 capsule (100 mg total) by mouth 3 (three) times daily.   hydrocortisone (ANUSOL-HC) 2.5 % rectal cream Place 1 Application rectally 2 (two) times daily.   No facility-administered encounter medications on file as of 07/01/2022.    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   Cataract    Bilat: has lens implants bilat.   COVID-19 virus infection 04/05/2021   Depression    Family history of colon cancer    Brother: in his 321s   Hemorrhoids    History of fusion of cervical spine    History of left lateral epicondylitis    History of retinal detachment    OU   Hyperlipidemia    Migraines    [place]    2004, when pt's stent clotted.  [facility]   Prediabetes    04/2019-->HbA1c 5.9% (unchanged compared to 2/3 yrs prior). 5.8% 11/2019  6.2% 05/2020. A1c 5.7% 11/2020.   Pseudophakia    RBBB    Past Surgical History:  Procedure Laterality Date   CARDIOVASCULAR STRESS TEST  2013   No ischemia.  EF 70%.   CATARACT EXTRACTION, BILATERAL     with lens implants  CERVICAL FUSION  2006   C5/6   CHOLECYSTECTOMY  2004   COLONOSCOPY  09/19/2010; 11/02/13; 04/28/18   2012: repeat 3 yrs,[facility].  2015 same.  2019 same.  2022 same->Recall 3-5 yrs.   CORONARY ARTERY BYPASS GRAFT  2004   CABGx4   CORONARY STENT PLACEMENT     BMS in 1990s.   POLYPECTOMY     RETINAL DETACHMENT SURGERY N/A    2011 & 2013 both eyes affected   TONSILLECTOMY  1956   Family History  Problem Relation Age of Onset   Arthritis Mother    Diabetes Mother    Osteoporosis Mother    Depression Father    Heart attack Father    Heart disease Father    Hyperlipidemia Father    Hypertension Father    Stroke Father    Aortic  aneurysm Father    Cancer Brother    Heart disease Brother    Hyperlipidemia Brother    Hypertension Brother    Colon cancer Brother 70   Colon polyps Brother    Colon polyps Brother    Colon cancer Brother    Diabetes Maternal Grandmother    Early death Paternal Grandfather    Esophageal cancer Neg Hx    Rectal cancer Neg Hx    Stomach cancer Neg Hx    Social History   Socioeconomic History   Marital status: Married    Spouse name: Not on file   Number of children: Not on file   Years of education: Not on file   Highest education level: Master's degree (e.g., MA, MS, MEng, MEd, MSW, MBA)  Occupational History   Not on file  Tobacco Use   Smoking status: Never   Smokeless tobacco: Never  Vaping Use   Vaping Use: Never used  Substance and Sexual Activity   Alcohol use: Yes    Alcohol/week: 1.0 standard drink of alcohol    Types: 1 Glasses of wine per week    Comment: glass of wine per week   Drug use: Never   Sexual activity: Yes  Other Topics Concern   Not on file  Social History Narrative   Married, 2 grown children.  No grandchildren.   [name] Degree: Marketing executive.   Gavin [name]; retired Chief Financial Officer.   Alc: 2 drinks per week.   No tob.   Social Determinants of Health   Financial Resource Strain: Low Risk  (07/01/2022)   Overall Financial Resource Strain (CARDIA)    Difficulty of Paying Living Expenses: Not hard at all  Food Insecurity: No Food Insecurity (07/01/2022)   Hunger Vital Sign    Worried About Running Out of Food in the Last Year: Never true    Ran Out of Food in the Last Year: Never true  Transportation Needs: No Transportation Needs (07/01/2022)   PRAPARE - Hydrologist (Medical): No    Lack of Transportation (Non-Medical): No  Physical Activity: Sufficiently Active (07/01/2022)   Exercise Vital Sign    Days of Exercise per Week: 4 days    Minutes of Exercise per Session: 40 min  Stress: No Stress Concern Present  (07/01/2022)   [place]    Feeling of Stress : Not at all  Social Connections: Moderately Integrated (07/01/2022)   Social Connection and Isolation Panel [NHANES]    Frequency of Communication with Friends and Family: More than three times a week    Frequency of Social Gatherings with Friends  and Family: Twice a week    Attends Religious Services: Never    Active Member of Clubs or Organizations: Yes    Attends Music therapist: More than 4 times per year    Marital Status: Married    Tobacco Counseling Counseling given: Not Answered   Clinical Intake:     Pain : No/denies pain     Diabetes: No  How often do you need to have someone help you when you read instructions, pamphlets, or other written materials from your doctor or pharmacy?: 1 - Never What is the last grade level you completed in school?: Graduate's Degree  Diabetic?no  Interpreter Needed?: No      Activities of Daily Living    07/01/2022    8:35 AM  In your present state of health, do you have any difficulty performing the following activities:  Hearing? 0  Vision? 0  Difficulty concentrating or making decisions? 0  Walking or climbing stairs? 0  Dressing or bathing? 0  Doing errands, shopping? 0  Preparing Food and eating ? N  Using the Toilet? N  In the past six months, have you accidently leaked urine? N  Do you have problems with loss of bowel control? N  Managing your Medications? N  Managing your Finances? N  Housekeeping or managing your Housekeeping? N    Patient Care Team: Tammi Sou, MD as PCP - General (Family Medicine) Lorretta Harp, MD as PCP - Cardiology (Cardiology) Lorretta Harp, MD as Consulting Physician (Cardiology) Pyrtle, Lajuan Lines, MD as Consulting Physician (Gastroenterology)  Indicate any recent Medical Services you may have received from other than [facility] providers in the past  year (date may be approximate).     Assessment:   This is a routine wellness examination for Gavin [name].  Hearing/Vision screen No results found.  Dietary issues and exercise activities discussed: Current Exercise Habits: Home exercise routine, Type of exercise: walking, Time (Minutes): 40, Frequency (Times/Week): 4, Weekly Exercise (Minutes/Week): 160, Intensity: Mild   Goals Addressed             This Visit's Progress    Quality of Life Maintained       Evidence-based guidance:  Assess patient's thoughts about quality of life, goals and expectations, and dissatisfaction or desire to improve.  Identify issues of primary importance such as mental health, illness, exercise tolerance, pain, sexual function and intimacy, cognitive change, social isolation, finances and relationships.  Assess and monitor for signs/symptoms of psychosocial concerns, especially depression or ideations regarding harm to others or self; provide or refer for mental health services as needed.  Identify sensory issues that impact quality of life such as hearing loss, vision deficit; strategize ways to maintain or improve hearing, vision.  Promote access to services in the community to support independence such as support groups, home visiting programs, financial assistance, handicapped parking tags, durable medical equipment and emergency responder.  Promote activities to decrease social isolation such as group support or social, leisure and recreational activities, employment, use of social media; consider safety concerns about being out of home for activities.  Provide patient an opportunity to share by storytelling or a "life review" to give positive meaning to life and to assist with coping and negative experiences.  Encourage patient to tap into hope to improve sense of self.  Counsel based on prognosis and as early as possible about end-of-life and palliative care; consider referral to palliative care provider.   Advocate for the development of  palliative care plan that may include avoidance of unnecessary testing and intervention, symptom control, discontinuation of medications, hospice and organ donation.  Counsel as early as possible those with life-limiting chronic disease about palliative care; consider referral to palliative care provider.  Advocate for the development of palliative care plan.   Notes:       Depression Screen    07/01/2022    8:37 AM 06/19/2022    8:26 AM 11/17/2021    8:18 AM 06/04/2021    1:08 PM 05/23/2021    9:23 AM 05/29/2020    1:38 PM 04/26/2020   11:20 AM  PHQ 2/9 Scores  PHQ - 2 Score 0 0 0 0 0 0 0    Fall Risk    06/19/2022    8:26 AM 11/17/2021    8:18 AM 06/04/2021    1:11 PM 05/23/2021    7:42 AM 05/29/2020    1:38 PM  [place] in the past year? 0 0 0 0 0  Number falls in past yr: 0 0 0  0  Injury with Fall? 0 0 0  0  Risk for fall due to : No Fall Risks      Follow up Falls evaluation completed Falls evaluation completed Falls prevention discussed  Falls prevention discussed    FALL RISK PREVENTION PERTAINING TO THE HOME:  Any stairs in or around the home? Yes  If so, are there any without handrails? No  Home free of loose throw rugs in walkways, pet beds, electrical cords, etc? Yes  Adequate lighting in your home to reduce risk of falls? Yes   ASSISTIVE DEVICES UTILIZED TO PREVENT FALLS:  Life alert? No  Use of a cane, walker or w/c? No  Grab bars in the bathroom? No  Shower chair or bench in shower? No  Elevated toilet seat or a handicapped toilet? No   TIMED UP AND GO:  Was the test performed? No .  Length of time to ambulate 10 feet: n/a sec.     Cognitive Function:    05/02/2019   10:04 AM  MMSE - Mini Mental State Exam  Orientation to time 5  Orientation to Place 5  Registration 3  Attention/ Calculation 5  Recall 3  Language- name 2 objects 2  Language- repeat 1  Language- follow 3 step command 3  Language-  read & follow direction 1  Write a sentence 1  Copy design 1  Total score 30        07/01/2022    8:40 AM 06/04/2021    1:13 PM  6CIT Screen  What Year? 0 points 0 points  What month? 0 points 0 points  What time? 0 points 0 points  Count back from 20 0 points 0 points  Months in reverse 0 points 0 points  Repeat phrase 0 points 0 points  Total Score 0 points 0 points    Immunizations Immunization History  Administered Date(s) Administered   Fluad Quad(high Dose 65+) 04/05/2019   Hepatitis A 02/07/2007, 07/11/2007   Hepatitis B 01/07/2007, 02/07/2007, 07/11/2007   IPV 01/07/2007   Influenza, High Dose Seasonal PF 04/07/2018, 03/16/2020, 04/08/2022   Influenza-Unspecified 05/09/2015, 03/27/2016, 04/28/2017, 03/12/2021   MMR 01/07/2007   Moderna Covid-19 Vaccine Bivalent Booster 27yr & up 04/10/2022   PFIZER(Purple Top)SARS-COV-2 Vaccination 09/11/2019, 10/02/2019, 04/12/2020, 11/06/2020   PPD Test 07/20/2010   Pfizer Covid-19 Vaccine Bivalent Booster 152yr& up 12/03/2020, 07/24/2021   Pneumococcal Conjugate-13 05/09/2015   Pneumococcal  Polysaccharide-23 03/28/2014   Pneumococcal-Unspecified 07/21/2015   Td 07/21/2015, 10/28/2021   Tdap 03/28/2014   Typhoid Live 01/07/2007, 09/09/2010   Zoster Recombinat (Shingrix) 06/19/2018, 09/16/2018   Zoster, Live 03/28/2014    TDAP status: Up to date  Flu Vaccine status: Up to date  Pneumococcal vaccine status: Due, Education has been provided regarding the importance of this vaccine. Advised may receive this vaccine at local pharmacy or Health Dept. Aware to provide a copy of the vaccination record if obtained from local pharmacy or Health Dept. Verbalized acceptance and understanding.  Covid-19 vaccine status: Completed vaccines  Qualifies for Shingles Vaccine? Yes   Zostavax completed No   Shingrix Completed?: Yes  Screening Tests Health Maintenance  Topic Date Due   COVID-19 Vaccine (8 - 2023-24 season) 07/05/2022  (Originally 06/05/2022)   Pneumonia Vaccine 48+ Years old (3 - PPSV23 or PCV20) 11/18/2022 (Originally 03/29/2019)   Medicare Annual Wellness (AWV)  07/02/2023   COLONOSCOPY (Pts 45-2yr Insurance coverage will need to be confirmed)  05/09/2026   DTaP/Tdap/Td (4 - Td or Tdap) 10/29/2031   INFLUENZA VACCINE  Completed   Zoster Vaccines- Shingrix  Completed   HPV VACCINES  Aged Out   Hepatitis C Screening  Discontinued    Health Maintenance  There are no preventive care reminders to display for this patient.   Colorectal cancer screening: Type of screening: Colonoscopy. Completed 05/09/21. Repeat every 5 years  Lung Cancer Screening: (Low Dose CT Chest recommended if Age 72-80years, 30 pack-year currently smoking OR have quit w/in 15years.) does not qualify.   Lung Cancer Screening Referral: n/a  Additional Screening:  Hepatitis C Screening: does qualify; Completed n/a  Vision Screening: Recommended annual ophthalmology exams for early detection of glaucoma and other disorders of the eye. Is the patient up to date with their annual eye exam?  Yes  Who is the provider or what is the name of the office in which the patient attends annual eye exams? N/a If pt is not established with a provider, would they like to be referred to a provider to establish care? No .   Dental Screening: Recommended annual dental exams for proper oral hygiene  Community Resource Referral / Chronic Care Management: CRR required this visit?  No   CCM required this visit?  No      Plan:     I have personally reviewed and noted the following in the patient's chart:   Medical and social history Use of alcohol, tobacco or illicit drugs  Current medications and supplements including opioid prescriptions. Patient is not currently taking opioid prescriptions. Functional ability and status Nutritional status Physical activity Advanced directives List of other physicians Hospitalizations, surgeries,  and ER visits in previous 12 months Vitals Screenings to include cognitive, depression, and falls Referrals and appointments  In addition, I have reviewed and discussed with patient certain preventive protocols, quality metrics, and best practice recommendations. A written personalized care plan for preventive services as well as general preventive health recommendations were provided to patient.     Gavin [name] CMelrose  07/01/2022   Nurse Notes: Non-Face to Face or Face to Face 10 minute visit Encounter    Gavin [name], Thank you for taking time to come for your Medicare Wellness Visit. I appreciate your ongoing commitment to your health goals. Please review the following plan we discussed and let me know if I can assist you in the future.   These are the goals we discussed:  Goals  Patient Stated     Continue eating healthy & maintain current level of activity     Patient Stated     Get back to being active      Quality of Life Maintained     Evidence-based guidance:  Assess patient's thoughts about quality of life, goals and expectations, and dissatisfaction or desire to improve.  Identify issues of primary importance such as mental health, illness, exercise tolerance, pain, sexual function and intimacy, cognitive change, social isolation, finances and relationships.  Assess and monitor for signs/symptoms of psychosocial concerns, especially depression or ideations regarding harm to others or self; provide or refer for mental health services as needed.  Identify sensory issues that impact quality of life such as hearing loss, vision deficit; strategize ways to maintain or improve hearing, vision.  Promote access to services in the community to support independence such as support groups, home visiting programs, financial assistance, handicapped parking tags, durable medical equipment and emergency responder.  Promote activities to decrease social isolation such as group  support or social, leisure and recreational activities, employment, use of social media; consider safety concerns about being out of home for activities.  Provide patient an opportunity to share by storytelling or a "life review" to give positive meaning to life and to assist with coping and negative experiences.  Encourage patient to tap into hope to improve sense of self.  Counsel based on prognosis and as early as possible about end-of-life and palliative care; consider referral to palliative care provider.  Advocate for the development of palliative care plan that may include avoidance of unnecessary testing and intervention, symptom control, discontinuation of medications, hospice and organ donation.  Counsel as early as possible those with life-limiting chronic disease about palliative care; consider referral to palliative care provider.  Advocate for the development of palliative care plan.   Notes:         This is a list of the screening recommended for you and due dates:  Health Maintenance  Topic Date Due   COVID-19 Vaccine (8 - 2023-24 season) 07/05/2022*   Pneumonia Vaccine (3 - PPSV23 or PCV20) 11/18/2022*   Medicare Annual Wellness Visit  07/02/2023   Colon Cancer Screening  05/09/2026   DTaP/Tdap/Td vaccine (4 - Td or Tdap) 10/29/2031   Flu Shot  Completed   Zoster (Shingles) Vaccine  Completed   HPV Vaccine  Aged Out   Hepatitis C Screening: USPSTF Recommendation to screen - Ages 67-79 yo.  Discontinued  *Topic was postponed. The date shown is not the original due date.

## 2022-08-07 ENCOUNTER — Other Ambulatory Visit: Payer: Self-pay | Admitting: Family Medicine

## 2022-08-31 DIAGNOSIS — H31093 Other chorioretinal scars, bilateral: Secondary | ICD-10-CM | POA: Diagnosis not present

## 2022-08-31 DIAGNOSIS — Z961 Presence of intraocular lens: Secondary | ICD-10-CM | POA: Diagnosis not present

## 2022-08-31 DIAGNOSIS — H5702 Anisocoria: Secondary | ICD-10-CM | POA: Diagnosis not present

## 2022-09-04 ENCOUNTER — Encounter: Payer: Self-pay | Admitting: Family Medicine

## 2022-09-09 ENCOUNTER — Encounter: Payer: Self-pay | Admitting: Family Medicine

## 2022-09-09 ENCOUNTER — Ambulatory Visit (INDEPENDENT_AMBULATORY_CARE_PROVIDER_SITE_OTHER): Payer: Medicare HMO | Admitting: Family Medicine

## 2022-09-09 VITALS — BP 101/66 | HR 96 | Temp 98.0°F | Ht 72.0 in | Wt 217.8 lb

## 2022-09-09 DIAGNOSIS — L720 Epidermal cyst: Secondary | ICD-10-CM

## 2022-09-09 DIAGNOSIS — K644 Residual hemorrhoidal skin tags: Secondary | ICD-10-CM | POA: Diagnosis not present

## 2022-09-09 MED ORDER — HYDROCORTISONE (PERIANAL) 2.5 % EX CREA: 1.0000 | TOPICAL_CREAM | Freq: Two times a day (BID) | CUTANEOUS | 6 refills | Status: DC

## 2022-09-09 NOTE — Patient Instructions (Addendum)
[facility] Address: [facility], [address] [place], [place] [address] Phone: [phone]   Get "sitz bath" at pharmacy over the counter and use this 1-2 times a day for 20 min. Also, apply a heating pad or warm compress to the area for 15 min twice a day.

## 2022-09-09 NOTE — Progress Notes (Signed)
OFFICE VISIT  09/09/2022  CC:  Chief Complaint  Patient presents with   Lump on buttock    Pt reports poss cyst on right buttock causing pain when sitting or touch; some redness. He noticed 6-8 weeks ago. Has been keeping it clean but no applications of cream/ointment.     Patient is a 72 y.o. male who presents for "lump or boil on buttock".  HPI: For over a month now he has noted a tender spot on the right gluteal area, hurts to sit on it.  Not draining. No fatigue or malaise or fever.    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   Cataract    Bilat: has lens implants bilat.   COVID-19 virus infection 04/05/2021   Depression    Family history of colon cancer    Brother: in his 82s.   Hemorrhoids    History of fusion of cervical spine    History of left lateral epicondylitis    History of retinal detachment    OU   Hyperlipidemia    Migraines    Myocardial infarct ([place])    2004, when pt's stent clotted.  He got CABG [facility] at that time.   Prediabetes    04/2019-->HbA1c 5.9% (unchanged compared to 2/3 yrs prior). 5.8% 11/2019  6.2% 05/2020. A1c 5.7% 11/2020.   Pseudophakia    RBBB     Past Surgical History:  Procedure Laterality Date   CARDIOVASCULAR STRESS TEST  2013   No ischemia.  EF 70%.   CATARACT EXTRACTION, BILATERAL     with lens implants   CERVICAL FUSION  2006   C5/6   CHOLECYSTECTOMY  2004   COLONOSCOPY  09/19/2010; 11/02/13; 04/28/18   2012: repeat 3 yrs,[place].  2015 same.  2019 same.  2022 same->Recall 3-5 yrs.   CORONARY ARTERY BYPASS GRAFT  2004   CABGx4   CORONARY STENT PLACEMENT     BMS in 1990s.   POLYPECTOMY     RETINAL DETACHMENT SURGERY N/A    2011 & 2013 both eyes affected   TONSILLECTOMY  1956    Outpatient Medications Prior to Visit   Medication Sig Dispense Refill   aspirin EC 81 MG tablet Take 81 mg by mouth daily.     atorvastatin (LIPITOR) 40 MG tablet Take 1 tablet (40 mg total) by mouth daily. 90 tablet 3   Cholecalciferol (VITAMIN D PO) Take 1 capsule by mouth daily.     Coenzyme Q10 (COQ10 PO) Take 300 mg by mouth daily.     gabapentin (NEURONTIN) 100 MG capsule TAKE ONE CAPSULE BY MOUTH THREE TIMES DAILY 270 capsule 1   hydrocortisone cream 0.5 % Apply 1 Application topically as needed.     No facility-administered medications prior to visit.    Allergies  Allergen Reactions   Penicillins Anaphylaxis    Review of Systems  As per HPI  PE:    09/09/2022    8:33 AM 06/19/2022    8:25 AM 05/12/2022    9:49 AM  Vitals with BMI  Height 6' 0"$  6' 0"$  6' 1"$   Weight 217 lbs 13 oz 214 lbs 215 lbs  BMI 29.53 [id number]  Systolic 99991111 99 [id number]  Diastolic 66 65 70  Pulse 96  66 61   Physical Exam  Gen: Alert, well appearing.  Patient is oriented to person, place, time, and situation. AFFECT: pleasant, lucid thought and speech. Inferior and medial aspect of right gluteal region has a bean sized subcu nodular lesion that feels rubbery.  No fluctuance.  Mildly tender to palpation.  Faint overlying pinkish hue but no erythema or streaking or violaceous discoloration.  LABS:  Last CBC Lab Results  Component Value Date   WBC 7.2 06/19/2022   HGB 16.5 06/19/2022   HCT 48.6 06/19/2022   MCV 86.3 06/19/2022   MCH 29.3 06/19/2022   RDW 12.6 06/19/2022   PLT 237 [id number]   Last metabolic panel Lab Results  Component Value Date   GLUCOSE 115 (H) 06/19/2022   NA 140 06/19/2022   K 5.0 06/19/2022   CL 105 06/19/2022   CO2 26 06/19/2022   BUN 17 06/19/2022   CREATININE 1.07 06/19/2022   CALCIUM 9.6 06/19/2022   PROT 6.3 11/17/2021   ALBUMIN 4.3 11/17/2021   BILITOT 1.0 11/17/2021   ALKPHOS 71 11/17/2021   AST 24 11/17/2021   ALT 24 11/17/2021    IMPRESSION AND PLAN:  #1 epidermal inclusion cyst,  inferomedial aspect of right gluteal region. Does not appear infected. Encouraged conservative management with sitz bath and warm compresses. Signs/symptoms to call or return for were reviewed and pt expressed understanding.  #2 external hemorrhoids.  Refilled his Anusol HC 2.5% cream.  Sometimes these flareup quite large and itchy and tender.  Recently he has calmed them down with the cream.  He may call back and request referral to gastroenterology for further evaluation and management of these in the future.  An After Visit Summary was printed and given to the patient.  FOLLOW UP: Return if symptoms worsen or fail to improve.  Signed:  Crissie Sickles, MD           09/09/2022

## 2022-10-26 DIAGNOSIS — Z800 Family history of malignant neoplasm of digestive organs: Secondary | ICD-10-CM | POA: Diagnosis not present

## 2022-10-26 DIAGNOSIS — I252 Old myocardial infarction: Secondary | ICD-10-CM | POA: Diagnosis not present

## 2022-10-26 DIAGNOSIS — E785 Hyperlipidemia, unspecified: Secondary | ICD-10-CM | POA: Diagnosis not present

## 2022-10-26 DIAGNOSIS — R030 Elevated blood-pressure reading, without diagnosis of hypertension: Secondary | ICD-10-CM | POA: Diagnosis not present

## 2022-10-26 DIAGNOSIS — E1140 Type 2 diabetes mellitus with diabetic neuropathy, unspecified: Secondary | ICD-10-CM | POA: Diagnosis not present

## 2022-10-26 DIAGNOSIS — Z961 Presence of intraocular lens: Secondary | ICD-10-CM | POA: Diagnosis not present

## 2022-10-26 DIAGNOSIS — Z008 Encounter for other general examination: Secondary | ICD-10-CM | POA: Diagnosis not present

## 2022-10-26 DIAGNOSIS — Z8249 Family history of ischemic heart disease and other diseases of the circulatory system: Secondary | ICD-10-CM | POA: Diagnosis not present

## 2022-10-26 DIAGNOSIS — I2510 Atherosclerotic heart disease of native coronary artery without angina pectoris: Secondary | ICD-10-CM | POA: Diagnosis not present

## 2022-10-26 DIAGNOSIS — Z7982 Long term (current) use of aspirin: Secondary | ICD-10-CM | POA: Diagnosis not present

## 2022-10-26 LAB — HM DIABETES EYE EXAM

## 2022-12-15 DIAGNOSIS — L814 Other melanin hyperpigmentation: Secondary | ICD-10-CM | POA: Diagnosis not present

## 2022-12-15 DIAGNOSIS — Z7189 Other specified counseling: Secondary | ICD-10-CM | POA: Diagnosis not present

## 2022-12-15 DIAGNOSIS — L309 Dermatitis, unspecified: Secondary | ICD-10-CM | POA: Diagnosis not present

## 2022-12-15 DIAGNOSIS — L570 Actinic keratosis: Secondary | ICD-10-CM | POA: Diagnosis not present

## 2022-12-15 DIAGNOSIS — L821 Other seborrheic keratosis: Secondary | ICD-10-CM | POA: Diagnosis not present

## 2022-12-15 DIAGNOSIS — D225 Melanocytic nevi of trunk: Secondary | ICD-10-CM | POA: Diagnosis not present

## 2022-12-16 NOTE — Patient Instructions (Signed)

## 2022-12-21 ENCOUNTER — Ambulatory Visit (INDEPENDENT_AMBULATORY_CARE_PROVIDER_SITE_OTHER): Payer: Medicare HMO | Admitting: Family Medicine

## 2022-12-21 ENCOUNTER — Encounter: Payer: Self-pay | Admitting: Family Medicine

## 2022-12-21 VITALS — BP 117/77 | HR 67 | Temp 98.1°F | Ht 72.0 in | Wt 206.6 lb

## 2022-12-21 DIAGNOSIS — Z125 Encounter for screening for malignant neoplasm of prostate: Secondary | ICD-10-CM

## 2022-12-21 DIAGNOSIS — E782 Mixed hyperlipidemia: Secondary | ICD-10-CM

## 2022-12-21 DIAGNOSIS — Z0000 Encounter for general adult medical examination without abnormal findings: Secondary | ICD-10-CM

## 2022-12-21 DIAGNOSIS — R7303 Prediabetes: Secondary | ICD-10-CM | POA: Diagnosis not present

## 2022-12-21 LAB — CBC
HCT: 44.7 % (ref 39.0–52.0)
Hemoglobin: 15.1 g/dL (ref 13.0–17.0)
MCHC: 33.9 g/dL (ref 30.0–36.0)
MCV: 86 fl (ref 78.0–100.0)
Platelets: 228 10*3/uL (ref 150.0–400.0)
RBC: 5.19 Mil/uL (ref 4.22–5.81)
RDW: 13.1 % (ref 11.5–15.5)
WBC: 6 10*3/uL (ref 4.0–10.5)

## 2022-12-21 LAB — POCT GLYCOSYLATED HEMOGLOBIN (HGB A1C)
HbA1c POC (<> result, manual entry): 5.8 % (ref 4.0–5.6)
HbA1c, POC (controlled diabetic range): 5.8 % (ref 0.0–7.0)
HbA1c, POC (prediabetic range): 5.8 % (ref 5.7–6.4)
Hemoglobin A1C: 5.8 % — AB (ref 4.0–5.6)

## 2022-12-21 LAB — PSA, MEDICARE: PSA: 10.38 ng/ml — ABNORMAL HIGH (ref 0.10–4.00)

## 2022-12-21 NOTE — Progress Notes (Signed)
Office Note 12/21/2022  CC:  Chief Complaint  Patient presents with   Medical Management of Chronic Issues    Pt is fasting    HPI: Patient is a 72 y.o. male who is here for annual health maintenance exam and f/u prediabetes. Doing well. Great diet and exercise habits.  No problem with hip slightly at all.  He is walking daily. Dealing with some hemorrhoids chronic, uses cortisone cream regularly.  Also taking fiber daily and this helps. He has occasional tingling in his toes but no pain in feet.  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   Cataract    Bilat: has lens implants bilat.   COVID-19 virus infection 04/05/2021   Depression    Family history of colon cancer    Brother: in his 30s.   Hemorrhoids    History of fusion of cervical spine    History of left lateral epicondylitis    History of retinal detachment    OU   Hyperlipidemia    Migraines    Myocardial infarct (HCC)    2004, when pt's stent clotted.  He got [facility]G at [facility] at that time.   Prediabetes    04/2019-->HbA1c 5.9% (unchanged compared to 2/3 yrs prior). 5.8% 11/2019  6.2% 05/2020. A1c 5.7% 11/2020.   Pseudophakia    RBBB     Past Surgical History:  Procedure Laterality Date   CARDIOVASCULAR STRESS TEST  2013   No ischemia.  EF 70%.   CATARACT EXTRACTION, BILATERAL     with lens implants   CERVICAL FUSION  2006   C5/6   CHOLECYSTECTOMY  2004   COLONOSCOPY  09/19/2010; 11/02/13; 04/28/18   2012: repeat 3 yrs,adenomas.  2015 same.  2019 same.  2022 same->Recall 3-5 yrs.   CORONARY ARTERY BYPASS GRAFT  2004   CABGx4   CORONARY STENT PLACEMENT     BMS in 1990s.   POLYPECTOMY     RETINAL DETACHMENT SURGERY N/A    2011 & 2013 both eyes affected   Teeth Alignment     TONSILLECTOMY  1956    Family History   Problem Relation Age of Onset   Arthritis Mother    Diabetes Mother    Osteoporosis Mother    Depression Father    Heart attack Father    Heart disease Father    Hyperlipidemia Father    Hypertension Father    Stroke Father    Aortic aneurysm Father    Cancer Brother    Heart disease Brother    Hyperlipidemia Brother    Hypertension Brother    Colon cancer Brother 30   Colon polyps Brother    Colon polyps Brother    Colon cancer Brother    Diabetes Maternal Grandmother    Early death Paternal Grandfather    Esophageal cancer Neg Hx    Rectal cancer Neg Hx    Stomach cancer Neg Hx     Social History   Socioeconomic History   Marital status: Married    Spouse name: Not on file   Number of children: Not on file   Years of education: Not on file   Highest education level: Master's degree (e.g., MA, MS, MEng, MEd, MSW, MBA)  Occupational History   Not  on file  Tobacco Use   Smoking status: Never   Smokeless tobacco: Never  Vaping Use   Vaping Use: Never used  Substance and Sexual Activity   Alcohol use: Yes    Alcohol/week: 1.0 standard drink of alcohol    Types: 1 Glasses of wine per week    Comment: glass of wine per week   Drug use: Never   Sexual activity: Yes  Other Topics Concern   Not on file  Social History Narrative   Married, 2 grown children.  No grandchildren.   Masters Degree: Public affairs consultant.   [name]; retired Art gallery manager.   Alc: 2 drinks per week.   No tob.   Social Determinants of Health   Financial Resource Strain: Low Risk  (09/08/2022)   Overall Financial Resource Strain (CARDIA)    Difficulty of Paying Living Expenses: Not hard at all  Food Insecurity: No Food Insecurity (09/08/2022)   Hunger Vital Sign    Worried About Running Out of Food in the Last Year: Never true    Ran Out of Food in the Last Year: Never true  Transportation Needs: No Transportation Needs (09/08/2022)   PRAPARE - Administrator, Civil Service (Medical): No     Lack of Transportation (Non-Medical): No  Physical Activity: Insufficiently Active (09/08/2022)   Exercise Vital Sign    Days of Exercise per Week: 4 days    Minutes of Exercise per Session: 30 min  Stress: No Stress Concern Present (09/08/2022)   [employer] of Occupational Health - Occupational Stress Questionnaire    Feeling of Stress : Only a little  Social Connections: Moderately Integrated (09/08/2022)   Social Connection and Isolation Panel [NHANES]    Frequency of Communication with Friends and Family: More than three times a week    Frequency of Social Gatherings with Friends and Family: Patient declined    Attends Religious Services: Never    Database administrator or Organizations: Yes    Attends Engineer, structural: More than 4 times per year    Marital Status: Married  Catering manager Violence: Not At Risk (07/01/2022)   Humiliation, Afraid, Rape, and Kick questionnaire    Fear of Current or Ex-Partner: No    Emotionally Abused: No    Physically Abused: No    Sexually Abused: No    Outpatient Medications Prior to Visit  Medication Sig Dispense Refill   aspirin EC 81 MG tablet Take 81 mg by mouth daily.     atorvastatin (LIPITOR) 40 MG tablet Take 1 tablet (40 mg total) by mouth daily. 90 tablet 3   Cholecalciferol (VITAMIN D PO) Take 1 capsule by mouth daily.     Coenzyme Q10 (COQ10 PO) Take 300 mg by mouth daily.     gabapentin (NEURONTIN) 100 MG capsule TAKE ONE CAPSULE BY MOUTH THREE TIMES DAILY 270 capsule 1   hydrocortisone cream 0.5 % Apply 1 Application topically as needed.     triamcinolone cream (KENALOG) 0.1 % Apply 1 Application topically 2 (two) times daily.     hydrocortisone (ANUSOL-HC) 2.5 % rectal cream Place 1 Application rectally 2 (two) times daily. (Patient not taking: Reported on 12/21/2022) 30 g 6   No facility-administered medications prior to visit.    Allergies  Allergen Reactions   Penicillins Anaphylaxis    Review of  Systems  Constitutional:  Negative for appetite change, chills, fatigue and fever.  HENT:  Negative for congestion, dental problem, ear pain and sore throat.  Eyes:  Negative for discharge, redness and visual disturbance.  Respiratory:  Negative for cough, chest tightness, shortness of breath and wheezing.   Cardiovascular:  Negative for chest pain, palpitations and leg swelling.  Gastrointestinal:  Negative for abdominal pain, blood in stool, diarrhea, nausea and vomiting.  Genitourinary:  Negative for difficulty urinating, dysuria, flank pain, frequency, hematuria and urgency.  Musculoskeletal:  Negative for arthralgias, back pain, joint swelling, myalgias and neck stiffness.  Skin:  Negative for pallor and rash.  Neurological:  Negative for dizziness, speech difficulty, weakness and headaches.  Hematological:  Negative for adenopathy. Does not bruise/bleed easily.  Psychiatric/Behavioral:  Negative for confusion and sleep disturbance. The patient is not nervous/anxious.     PE;    12/21/2022    8:26 AM 09/09/2022    8:33 AM 06/19/2022    8:25 AM  Vitals with BMI  Height 6\' 0"  6\' 0"  6\' 0"   Weight 206 lbs 10 oz 217 lbs 13 oz 214 lbs  BMI 28.01 29.53 29.02  Systolic 117 101 99  Diastolic 77 66 65  Pulse 67 96 66    Gen: Alert, well appearing.  Patient is oriented to person, place, time, and situation. AFFECT: pleasant, lucid thought and speech. ENT: Ears: EACs clear, normal epithelium.  TMs with good light reflex and landmarks bilaterally.  Eyes: no injection, icteris, swelling, or exudate.  EOMI, PERRLA. Nose: no drainage or turbinate edema/swelling.  No injection or focal lesion.  Mouth: lips without lesion/swelling.  Oral mucosa pink and moist.  Dentition intact and without obvious caries or gingival swelling.  Oropharynx without erythema, exudate, or swelling.  Neck: supple/nontender.  No LAD, mass, or TM.  Carotid pulses 2+ bilaterally, without bruits. CV: RRR, no m/r/g.    LUNGS: CTA bilat, nonlabored resps, good aeration in all lung fields. ABD: soft, NT, ND, BS normal.  No hepatospenomegaly or mass.  No bruits. EXT: no clubbing, cyanosis, or edema.  Musculoskeletal: no joint swelling, erythema, warmth, or tenderness.  ROM of all joints intact. Skin - no sores or suspicious lesions or rashes or color changes  Pertinent labs:  Lab Results  Component Value Date   TSH 3.17 06/10/2020   Lab Results  Component Value Date   WBC 7.2 06/19/2022   HGB 16.5 06/19/2022   HCT 48.6 06/19/2022   MCV 86.3 06/19/2022   PLT 237 06/19/2022   Lab Results  Component Value Date   CREATININE 1.07 06/19/2022   BUN 17 06/19/2022   NA 140 06/19/2022   K 5.0 06/19/2022   CL 105 06/19/2022   CO2 26 06/19/2022   Lab Results  Component Value Date   ALT 24 11/17/2021   AST 24 11/17/2021   ALKPHOS 71 11/17/2021   BILITOT 1.0 11/17/2021   Lab Results  Component Value Date   CHOL 119 06/19/2022   Lab Results  Component Value Date   HDL 50 06/19/2022   Lab Results  Component Value Date   LDLCALC 47 06/19/2022   Lab Results  Component Value Date   TRIG 132 06/19/2022   Lab Results  Component Value Date   CHOLHDL 2.4 06/19/2022   Lab Results  Component Value Date   PSA 1.76 11/17/2021   PSA 1.58 06/19/2021   PSA 1.51 06/10/2020   Lab Results  Component Value Date   HGBA1C 5.8 (A) 12/21/2022   HGBA1C 5.8 12/21/2022   HGBA1C 5.8 12/21/2022   HGBA1C 5.8 12/21/2022   ASSESSMENT AND PLAN:   #1 health maintenance  exam: Reviewed age and gender appropriate health maintenance issues (prudent diet, regular exercise, health risks of tobacco and excessive alcohol, use of seatbelts, fire alarms in home, use of sunscreen).  Also reviewed age and gender appropriate health screening as well as vaccine recommendations. Vaccines: all UTD Labs: Hba1c, lipids, cmet, cbc Prostate ca screening: PSA Colon ca screening: recall 04/2024.  #2 prediabetes. Great  TLC. POC Hba1c today is 5.8%.  #3 hyperlipidemia. Doing great on a atorvastatin 40 mg a day. Lipid panel and hepatic panel today.  An After Visit Summary was printed and given to the patient.  FOLLOW UP:  Return in about 6 months (around 06/22/2023) for routine chronic illness f/u.  Signed:  Santiago Bumpers, MD           12/21/2022

## 2022-12-22 ENCOUNTER — Telehealth: Payer: Self-pay | Admitting: Family Medicine

## 2022-12-22 ENCOUNTER — Other Ambulatory Visit: Payer: Self-pay | Admitting: Family Medicine

## 2022-12-22 DIAGNOSIS — R972 Elevated prostate specific antigen [PSA]: Secondary | ICD-10-CM

## 2022-12-22 LAB — LIPID PANEL
Cholesterol: 114 mg/dL (ref <200)
HDL: 49 mg/dL (ref >=40)
LDL Cholesterol (Calc): 46 mg/dL (calc)
Non-HDL Cholesterol (Calc): 65 mg/dL (calc) (ref <130)
Total CHOL/HDL Ratio: 2.3 (calc) (ref <5.0)
Triglycerides: 108 mg/dL (ref <150)

## 2022-12-22 LAB — COMPREHENSIVE METABOLIC PANEL
AG Ratio: 1.9 (calc) (ref 1.0–2.5)
ALT: 23 U/L (ref 9–46)
AST: 22 U/L (ref 10–35)
Albumin: 4.2 g/dL (ref 3.6–5.1)
Alkaline phosphatase (APISO): 79 U/L (ref 35–144)
BUN: 17 mg/dL (ref 7–25)
CO2: 25 mmol/L (ref 20–32)
Calcium: 9.3 mg/dL (ref 8.6–10.3)
Chloride: 105 mmol/L (ref 98–110)
Creat: 1.11 mg/dL (ref 0.70–1.28)
Globulin: 2.2 g/dL (calc) (ref 1.9–3.7)
Glucose, Bld: 103 mg/dL — ABNORMAL HIGH (ref 65–99)
Potassium: 4.5 mmol/L (ref 3.5–5.3)
Sodium: 141 mmol/L (ref 135–146)
Total Bilirubin: 1 mg/dL (ref 0.2–1.2)
Total Protein: 6.4 g/dL (ref 6.1–8.1)

## 2022-12-22 NOTE — Telephone Encounter (Signed)
Patient would like a call back to discuss additional concerns about his test results.  I informed Gavin [name] that the referral for urology was placed but was in a pending status and will be ready for initial scheduling soon.

## 2022-12-22 NOTE — Telephone Encounter (Signed)
Please follow up with pt

## 2022-12-23 ENCOUNTER — Ambulatory Visit (INDEPENDENT_AMBULATORY_CARE_PROVIDER_SITE_OTHER): Payer: Medicare HMO | Admitting: Family Medicine

## 2022-12-23 ENCOUNTER — Encounter: Payer: Self-pay | Admitting: Family Medicine

## 2022-12-23 VITALS — BP 130/77 | HR 71 | Ht 72.0 in | Wt 207.0 lb

## 2022-12-23 DIAGNOSIS — R972 Elevated prostate specific antigen [PSA]: Secondary | ICD-10-CM

## 2022-12-23 NOTE — Progress Notes (Signed)
OFFICE VISIT  12/23/2022  CC:  Chief Complaint  Patient presents with   Exam    Patient is a 72 y.o. male who presents for prostate exam.  INTERIM HX: PSA returned significantly elevated last visit--> 10.38.  This was compared to 1.76 the year prior. He is here for a digital rectal exam.  He has some occasional mild lower urinary tract obstructive symptoms but nothing persistent and nothing of recent acute onset.  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   Cataract    Bilat: has lens implants bilat.   COVID-19 virus infection 04/05/2021   Depression    Family history of colon cancer    Brother: in his 30s.   Hemorrhoids    History of fusion of cervical spine    History of left lateral epicondylitis    History of retinal detachment    OU   Hyperlipidemia    Migraines    Myocardial infarct (HCC)    2004, when pt's stent clotted.  He got CABG at [facility] at that time.   Prediabetes    04/2019-->HbA1c 5.9% (unchanged compared to 2/3 yrs prior). 5.8% 11/2019  6.2% 05/2020. A1c 5.7% 11/2020.   Pseudophakia    RBBB     Past Surgical History:  Procedure Laterality Date   CARDIOVASCULAR STRESS TEST  2013   No ischemia.  EF 70%.   CATARACT EXTRACTION, BILATERAL     with lens implants   CERVICAL FUSION  2006   C5/6   CHOLECYSTECTOMY  2004   COLONOSCOPY  09/19/2010; 11/02/13; 04/28/18   2012: repeat 3 yrs,adenomas.  2015 same.  2019 same.  2022 same->Recall 3-5 yrs.   CORONARY ARTERY BYPASS GRAFT  2004   CABGx4   CORONARY STENT PLACEMENT     BMS in 1990s.   POLYPECTOMY     RETINAL DETACHMENT SURGERY N/A    2011 & 2013 both eyes affected   TONSILLECTOMY  1956    Outpatient Medications Prior to Visit  Medication Sig Dispense Refill   aspirin EC 81 MG tablet Take 81 mg by mouth daily.      atorvastatin (LIPITOR) 40 MG tablet Take 1 tablet (40 mg total) by mouth daily. 90 tablet 3   Cholecalciferol (VITAMIN D PO) Take 1 capsule by mouth daily.     Coenzyme Q10 (COQ10 PO) Take 300 mg by mouth daily.     gabapentin (NEURONTIN) 100 MG capsule TAKE ONE CAPSULE BY MOUTH THREE TIMES DAILY 270 capsule 1   hydrocortisone cream 0.5 % Apply 1 Application topically as needed.     triamcinolone cream (KENALOG) 0.1 % Apply 1 Application topically 2 (two) times daily.     No facility-administered medications prior to visit.    Allergies  Allergen Reactions   Penicillins Anaphylaxis    Review of Systems As per HPI  PE:    12/23/2022    4:04 PM 12/21/2022    8:26 AM 09/09/2022    8:33 AM  Vitals with BMI  Height 6\' 0"  6\' 0"  6\' 0"   Weight 207 lbs 206 lbs 10 oz 217 lbs 13 oz  BMI 28.07 28.01 29.53  Systolic 130 117 409  Diastolic 77 77 66  Pulse 71 67 96     Physical Exam  Gen: Alert, well appearing.  Patient is oriented to person, place, time, and situation. Rectal exam: negative without mass, lesions or tenderness, PROSTATE EXAM: smooth and symmetric without nodules or tenderness.   LABS:  Last metabolic panel Lab Results  Component Value Date   GLUCOSE 103 (H) 12/21/2022   NA 141 12/21/2022   K 4.5 12/21/2022   CL 105 12/21/2022   CO2 25 12/21/2022   BUN 17 12/21/2022   CREATININE 1.11 12/21/2022   CALCIUM 9.3 12/21/2022   PROT 6.4 12/21/2022   ALBUMIN 4.3 11/17/2021   BILITOT 1.0 12/21/2022   ALKPHOS 71 11/17/2021   AST 22 12/21/2022   ALT 23 12/21/2022     Lab Results  Component Value Date   PSA 10.38 (H) 12/21/2022   PSA 1.76 11/17/2021   PSA 1.58 06/19/2021    IMPRESSION AND PLAN:  Elevated PSA. Normal digital rectal exam today. Urology referral has been ordered already.  Gave pt their phone # today so he can reach out to them to schedule.  An After Visit Summary was printed and given to the patient.  FOLLOW UP: Return for as  needed.  Signed:  Santiago Bumpers, MD           12/23/2022

## 2022-12-23 NOTE — Patient Instructions (Addendum)
Please contact [facility] to schedule an appointment.  [facility] Address: [address] [place], [place], [place] [address] Phone: [phone]

## 2022-12-23 NOTE — Telephone Encounter (Signed)
Pls get pt on my schedule this afternoon as a workin at 4:20 for prostate exam-thx

## 2022-12-23 NOTE — Telephone Encounter (Signed)
Spoke with patient regarding results/recommendations.  

## 2023-01-18 DIAGNOSIS — R972 Elevated prostate specific antigen [PSA]: Secondary | ICD-10-CM | POA: Diagnosis not present

## 2023-01-18 LAB — PSA: PSA: 13.1

## 2023-01-24 ENCOUNTER — Other Ambulatory Visit: Payer: Self-pay | Admitting: Family Medicine

## 2023-01-25 ENCOUNTER — Other Ambulatory Visit: Payer: Self-pay | Admitting: Urology

## 2023-01-25 DIAGNOSIS — R972 Elevated prostate specific antigen [PSA]: Secondary | ICD-10-CM

## 2023-01-26 ENCOUNTER — Other Ambulatory Visit: Payer: Self-pay | Admitting: Family Medicine

## 2023-01-26 ENCOUNTER — Encounter: Payer: Self-pay | Admitting: Family Medicine

## 2023-01-26 MED ORDER — GABAPENTIN 100 MG PO CAPS: 100.0000 mg | ORAL_CAPSULE | Freq: Three times a day (TID) | ORAL | 1 refills | Status: DC

## 2023-02-01 ENCOUNTER — Encounter: Payer: Self-pay | Admitting: Urology

## 2023-02-09 ENCOUNTER — Encounter: Payer: Self-pay | Admitting: Family Medicine

## 2023-02-09 DIAGNOSIS — K644 Residual hemorrhoidal skin tags: Secondary | ICD-10-CM

## 2023-02-10 NOTE — Telephone Encounter (Signed)
Please order referral to [facility] gastroenterology, diagnosis hemorrhoids.

## 2023-02-15 ENCOUNTER — Ambulatory Visit
Admission: RE | Admit: 2023-02-15 | Discharge: 2023-02-15 | Disposition: A | Discharge status: Home / Self Care | Payer: Medicare HMO | Source: Ambulatory Visit | Attending: Urology | Admitting: Urology

## 2023-02-15 DIAGNOSIS — R972 Elevated prostate specific antigen [PSA]: Secondary | ICD-10-CM

## 2023-02-15 MED ORDER — GADOPICLENOL 0.5 MMOL/ML IV SOLN
8.0000 mL | Freq: Once | INTRAVENOUS | Status: AC | PRN
  Administered 2023-02-15: 9 mL via INTRAVENOUS

## 2023-03-04 DIAGNOSIS — R972 Elevated prostate specific antigen [PSA]: Secondary | ICD-10-CM | POA: Diagnosis not present

## 2023-03-09 ENCOUNTER — Encounter: Payer: Self-pay | Admitting: Family Medicine

## 2023-03-11 DIAGNOSIS — R972 Elevated prostate specific antigen [PSA]: Secondary | ICD-10-CM | POA: Diagnosis not present

## 2023-03-11 DIAGNOSIS — R3915 Urgency of urination: Secondary | ICD-10-CM | POA: Diagnosis not present

## 2023-03-11 DIAGNOSIS — N401 Enlarged prostate with lower urinary tract symptoms: Secondary | ICD-10-CM | POA: Diagnosis not present

## 2023-03-15 ENCOUNTER — Ambulatory Visit: Payer: Medicare HMO | Admitting: Internal Medicine

## 2023-03-15 ENCOUNTER — Encounter: Payer: Self-pay | Admitting: Internal Medicine

## 2023-03-15 VITALS — BP 118/80 | HR 105 | Ht 72.0 in | Wt 199.5 lb

## 2023-03-15 DIAGNOSIS — K648 Other hemorrhoids: Secondary | ICD-10-CM | POA: Diagnosis not present

## 2023-03-15 DIAGNOSIS — R150 Incomplete defecation: Secondary | ICD-10-CM | POA: Diagnosis not present

## 2023-03-15 DIAGNOSIS — R195 Other fecal abnormalities: Secondary | ICD-10-CM

## 2023-03-15 DIAGNOSIS — Z86010 Personal history of colonic polyps: Secondary | ICD-10-CM

## 2023-03-15 NOTE — Patient Instructions (Addendum)
HEMORRHOID BANDING PROCEDURE    FOLLOW-UP CARE   The procedure you have had should have been relatively painless since the banding of the area involved does not have nerve endings and there is no pain sensation.  The rubber band cuts off the blood supply to the hemorrhoid and the band may fall off as soon as 48 hours after the banding (the band may occasionally be seen in the toilet bowl following a bowel movement). You may notice a temporary feeling of fullness in the rectum which should respond adequately to plain Tylenol or Motrin.  Following the banding, avoid strenuous exercise that evening and resume full activity the next day.  A sitz bath (soaking in a warm tub) or bidet is soothing, and can be useful for cleansing the area after bowel movements.     To avoid constipation, take two tablespoons of natural wheat bran, natural oat bran, flax, Benefiber or any over the counter fiber supplement and increase your water intake to 7-8 glasses daily.    Unless you have been prescribed anorectal medication, do not put anything inside your rectum for two weeks: No suppositories, enemas, fingers, etc.  Occasionally, you may have more bleeding than usual after the banding procedure.  This is often from the untreated hemorrhoids rather than the treated one.  Don't be concerned if there is a tablespoon or so of blood.  If there is more blood than this, lie flat with your bottom higher than your head and apply an ice pack to the area. If the bleeding does not stop within a half an hour or if you feel faint, call our office at [phone] or go to the emergency room.  Problems are not common; however, if there is a substantial amount of bleeding, severe pain, chills, fever or difficulty passing urine (very rare) or other problems, you should call us at [phone] or report to the nearest emergency room.  Do not stay seated continuously for more than 2-3 hours for a day or two after the procedure.   Tighten your buttock muscles 10-15 times every two hours and take 10-15 deep breaths every 1-2 hours.  Do not spend more than a few minutes on the toilet if you cannot empty your bowel; instead re-visit the toilet at a later time.  Please purchase the following medications over the counter and take as directed: Colace every night.

## 2023-03-15 NOTE — Progress Notes (Signed)
Subjective:    Patient ID: Gavin [name], male    DOB: 08/28/1950, 72 y.o.   MRN: [id number]  HPI Gavin [name] is a 72 year old male with a history of colonic polyps (multiple adenomas and SSP's), family history of colon cancer in the patient's brother at age 87, internal hemorrhoids, hyperlipidemia, BPH with elevated PSA who is here to discuss hemorrhoids.  He is here alone today.  I last saw him in October 2022 when I performed his surveillance colonoscopy.  During this exam 3 polyps were removed and a perianal skin tag was seen.  Biopsy proved this not to be AIN and small and internal hemorrhoids were seen.  He reports that he has been dealing with symptomatic hemorrhoids over the last several years.  He uses 2.5% hydrocortisone cream which helps but does not eliminate his symptoms.  He will see scant blood but has uncomfortable swelling and bulging after bowel movement.  He does feel the perianal skin tag.  In the last 6 months or so he reports that it can be harder to move his stools and feeling of incomplete evacuation.  He started psyllium 3 capsules daily which helps.  He is also recently had a prostate biopsy and found out the biopsies were negative for cancer.  He is on finasteride and Flomax.  He is interested in hemorrhoidal banding  Review of Systems As per HPI, otherwise negative  Current Medications, Allergies, Past Medical History, Past Surgical History, Family History and Social History were reviewed in [name] record.    Objective:   Physical Exam BP 118/80   Pulse (!) 105   Ht 6' (1.829 m)   Wt 199 lb 8 oz (90.5 kg)   SpO2 96%   BMI 27.06 kg/m  Gen: awake, alert, NAD HEENT: anicteric  Abd: soft, NT/ND, +BS throughout Ext: no c/c/e Neuro: nonfocal      Assessment & Plan:  72 year old male with a history of colonic polyps (multiple adenomas and SSP's), family history of colon cancer in the patient's brother at age 82, internal  hemorrhoids, hyperlipidemia, BPH with elevated PSA who is here to discuss hemorrhoids.  Symptomatic internal hemorrhoids/perianal skin tag -we discussed how banding internal hemorrhoids can help with prolapse symptoms but also decrease external hemorrhoidal symptoms as well.  We discussed the risk, benefits and alternatives to banding and he is agreeable and wishes to proceed today   PROCEDURE NOTE:  The patient presents with symptomatic grade 2 internal hemorrhoids, requesting rubber band ligation of his hemorrhoidal disease.  All risks, benefits and alternative forms of therapy were described and informed consent was obtained.   The anorectum was pre-medicated with 0.125% nitroglycerin ointment The decision was made to band the RP internal hemorrhoid, and the [facility] System was used to perform band ligation without complication.   Digital anorectal examination was then performed to assure proper positioning of the band, and to adjust the banded tissue as required.  The patient was discharged home without pain or other issues.  Dietary and behavioral recommendations were given and along with follow-up instructions.     The patient will return as scheduled for follow-up and possible additional banding as required. No complications were encountered and the patient tolerated the procedure well.  2.  Incomplete defecation/mild constipation --psyllium has been helpful but have also suggested he add Colace --Continue psyllium daily with liberal fluid intake --Add Colace 100 to 200 mg nightly  3.  History of multiple adenomatous colon polyps and  family history of colon cancer --surveillance colonoscopy recommended at 5 years which would be around October 2027

## 2023-03-18 ENCOUNTER — Encounter: Payer: Self-pay | Admitting: Family Medicine

## 2023-03-18 NOTE — Telephone Encounter (Signed)
No further action needed.

## 2023-04-15 ENCOUNTER — Encounter: Payer: Self-pay | Admitting: Internal Medicine

## 2023-04-15 ENCOUNTER — Ambulatory Visit: Payer: Medicare HMO | Admitting: Internal Medicine

## 2023-04-15 VITALS — BP 108/60 | HR 97 | Ht 72.0 in | Wt 199.0 lb

## 2023-04-15 DIAGNOSIS — R12 Heartburn: Secondary | ICD-10-CM

## 2023-04-15 DIAGNOSIS — K648 Other hemorrhoids: Secondary | ICD-10-CM | POA: Diagnosis not present

## 2023-04-15 NOTE — Patient Instructions (Signed)
Start over the counter pepcid (famotidine) 20 mg at bedtime as needed.   HEMORRHOID BANDING PROCEDURE    FOLLOW-UP CARE   The procedure you have had should have been relatively painless since the banding of the area involved does not have nerve endings and there is no pain sensation.  The rubber band cuts off the blood supply to the hemorrhoid and the band may fall off as soon as 48 hours after the banding (the band may occasionally be seen in the toilet bowl following a bowel movement). You may notice a temporary feeling of fullness in the rectum which should respond adequately to plain Tylenol or Motrin.  Following the banding, avoid strenuous exercise that evening and resume full activity the next day.  A sitz bath (soaking in a warm tub) or bidet is soothing, and can be useful for cleansing the area after bowel movements.     To avoid constipation, take two tablespoons of natural wheat bran, natural oat bran, flax, Benefiber or any over the counter fiber supplement and increase your water intake to 7-8 glasses daily.    Unless you have been prescribed anorectal medication, do not put anything inside your rectum for two weeks: No suppositories, enemas, fingers, etc.  Occasionally, you may have more bleeding than usual after the banding procedure.  This is often from the untreated hemorrhoids rather than the treated one.  Don't be concerned if there is a tablespoon or so of blood.  If there is more blood than this, lie flat with your bottom higher than your head and apply an ice pack to the area. If the bleeding does not stop within a half an hour or if you feel faint, call our office at [phone] or go to the emergency room.  Problems are not common; however, if there is a substantial amount of bleeding, severe pain, chills, fever or difficulty passing urine (very rare) or other problems, you should call us at [phone] or report to the nearest emergency room.  Do not stay seated  continuously for more than 2-3 hours for a day or two after the procedure.  Tighten your buttock muscles 10-15 times every two hours and take 10-15 deep breaths every 1-2 hours.  Do not spend more than a few minutes on the toilet if you cannot empty your bowel; instead re-visit the toilet at a later time.   _______________________________________________________  If your blood pressure at your visit was 140/90 or greater, please contact your primary care physician to follow up on this.  _______________________________________________________  If you are age 73 or older, your body mass index should be between 23-30. Your Body mass index is 26.99 kg/m. If this is out of the aforementioned range listed, please consider follow up with your Primary Care Provider.  If you are age 41 or younger, your body mass index should be between 19-25. Your Body mass index is 26.99 kg/m. If this is out of the aformentioned range listed, please consider follow up with your Primary Care Provider.   ________________________________________________________  The [facility] providers would like to encourage you to use [facility] to communicate with providers for non-urgent requests or questions.  Due to long hold times on the telephone, sending your provider a message by [facility] may be a faster and more efficient way to get a response.  Please allow 48 business hours for a response.  Please remember that this is for non-urgent requests.  _______________________________________________________

## 2023-04-15 NOTE — Progress Notes (Signed)
Gavin [name] is a 72 year old male with a history of multiple adenomatous and sessile serrated colon polyps, symptomatic internal hemorrhoids, family history of colon cancer in patient's brother, intermittent GERD who is here for follow-up.  He was here on 03/15/2023 to discuss symptomatic internal hemorrhoids.  Hemorrhoidal banding was performed on that date of the right posterior internal hemorrhoid  He has had excellent response to hemorrhoidal banding.  He has had the skin tag resolved and he had no further prolapse since that time.  He is very happy with the result.  Symptoms prior to banding: Prolapse with skin tag  He has noticed some mild heartburn which can wake him from sleep on occasion.  Less than 1 or 2 days/week.  Tends to happen if he eats late at night.  No dysphagia or odynophagia.  No weight loss.  No nausea or vomiting.   PROCEDURE NOTE:  The patient presents with symptomatic grade 2 internal hemorrhoids, requesting rubber band ligation of his hemorrhoidal disease.  All risks, benefits and alternative forms of therapy were described and informed consent was obtained.   The anorectum was pre-medicated with 0.125% nitroglycerin ointment The decision was made to band the LL (RP banded initially) internal hemorrhoid, and the [facility] O'Regan System was used to perform band ligation without complication.   Digital anorectal examination was then performed to assure proper positioning of the band, and to adjust the banded tissue as required.  The patient was discharged home without pain or other issues.  Dietary and behavioral recommendations were given and along with follow-up instructions.     The following adjunctive treatments were recommended: Continue psyllium  The patient will return as scheduled for follow-up and possible additional banding as required. No complications were encountered and the patient tolerated the procedure well.  2. GERD --periodic nocturnal reflux.   Likely related to eating late at night.  We discussed reflux precautions and I recommended he try famotidine 20 mg nightly as needed

## 2023-04-21 DIAGNOSIS — H5319 Other subjective visual disturbances: Secondary | ICD-10-CM | POA: Diagnosis not present

## 2023-04-21 DIAGNOSIS — H35372 Puckering of macula, left eye: Secondary | ICD-10-CM | POA: Diagnosis not present

## 2023-04-29 ENCOUNTER — Ambulatory Visit: Payer: Medicare HMO | Admitting: Physician Assistant

## 2023-05-12 ENCOUNTER — Encounter: Payer: Self-pay | Admitting: Family Medicine

## 2023-05-17 DIAGNOSIS — L578 Other skin changes due to chronic exposure to nonionizing radiation: Secondary | ICD-10-CM | POA: Diagnosis not present

## 2023-05-17 DIAGNOSIS — L570 Actinic keratosis: Secondary | ICD-10-CM | POA: Diagnosis not present

## 2023-05-17 DIAGNOSIS — L309 Dermatitis, unspecified: Secondary | ICD-10-CM | POA: Diagnosis not present

## 2023-05-24 ENCOUNTER — Ambulatory Visit: Payer: Medicare HMO | Attending: Cardiovascular Disease | Admitting: Cardiovascular Disease

## 2023-05-24 VITALS — BP 118/76 | HR 75 | Ht 72.6 in | Wt 193.0 lb

## 2023-05-24 DIAGNOSIS — I4510 Unspecified right bundle-branch block: Secondary | ICD-10-CM

## 2023-05-24 DIAGNOSIS — E782 Mixed hyperlipidemia: Secondary | ICD-10-CM | POA: Diagnosis not present

## 2023-05-24 DIAGNOSIS — I2510 Atherosclerotic heart disease of native coronary artery without angina pectoris: Secondary | ICD-10-CM

## 2023-05-24 NOTE — Assessment & Plan Note (Signed)
History of hyperlipidemia on statin therapy with lipid profile performed 12/21/2022 revealing total cholesterol 114, LDL 46 and HDL 49.

## 2023-05-24 NOTE — Assessment & Plan Note (Signed)
Chronic. 

## 2023-05-24 NOTE — Patient Instructions (Signed)
    Follow-Up: At [facility], you and your health needs are our priority.  As part of our continuing mission to provide you with exceptional heart care, we have created designated Provider Care Teams.  These Care Teams include your primary Cardiologist (physician) and Advanced Practice Providers (APPs -  Physician Assistants and Nurse Practitioners) who all work together to provide you with the care you need, when you need it.  We recommend signing up for the patient portal called "MyChart".  Sign up information is provided on this After Visit Summary.  MyChart is used to connect with patients for Virtual Visits (Telemedicine).  Patients are able to view lab/test results, encounter notes, upcoming appointments, etc.  Non-urgent messages can be sent to your provider as well.   To learn more about what you can do with MyChart, go to [email or web address].    Your next appointment:   12 month(s)  Provider:   Jonathan Berry, MD      

## 2023-05-24 NOTE — Progress Notes (Signed)
05/24/2023 Gavin [name]   Aug 18, 1950  [id number]  Primary Physician McGowen, Maryjean Morn, MD Primary Cardiologist: Runell Gess MD Nicholes Calamity, [place]  HPI:  Gavin [name] is a 72 y.o.  mildly overweight married Caucasian male father 2 children their 7s (1 lives in [place] and 1 lives in [place]) referred by Dr. Milinda Cave to be established in my practice for ongoing care because of known disease.  He is a retired Museum/gallery conservator.  I last saw him in the office 05/08/2022.  His risk factors include treated hyperlipidemia as well as family history with a brother who had a myocardial infarction.  He had a stent placed in [place] work on his LAD in 1994.  He had coronary artery bypass grafting x4 in 2004 at [facility] after myocardial infarction.  He had a stress test along the way most recently at [facility] in 2013 which was nonischemic with an EF of 70%.     Prior to COVID-19 and the hot weather he was bicycling fairly frequently without symptoms.  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  He currently is flying gliders.   Since I saw him virtually a year ago he continues to do well.  He is still active, walking at least 4 miles per day.Marland Kitchen He denies chest pain or shortness of breath.  He has lost proxy 30 pounds since I last saw him as a result of diet and exercise and feels clinically improved.  He attributes some of this weight loss to the use of his "Invisalign".   Current Meds  Medication Sig   aspirin EC 81 MG tablet Take 81 mg by mouth daily.   atorvastatin (LIPITOR) 40 MG tablet Take 1 tablet (40 mg total) by mouth daily.   finasteride (PROSCAR) 5 MG tablet Take 5 mg by mouth daily.   hydrocortisone cream 0.5 % Apply 1 Application topically as needed.   tamsulosin (FLOMAX) 0.4 MG CAPS capsule Take 0.4 mg by mouth daily.   triamcinolone cream (KENALOG) 0.1 % Apply 1  Application topically 2 (two) times daily.     Allergies  Allergen Reactions   Penicillins Anaphylaxis    Social History   Socioeconomic History   Marital status: Married    Spouse name: Not on file   Number of children: Not on file   Years of education: Not on file   Highest education level: Master's degree (e.g., MA, MS, MEng, MEd, MSW, MBA)  Occupational History   Not on file  Tobacco Use   Smoking status: Never   Smokeless tobacco: Never  Vaping Use   Vaping status: Never Used  Substance and Sexual Activity   Alcohol use: Yes    Alcohol/week: 1.0 standard drink of alcohol    Types: 1 Glasses of wine per week    Comment: glass of wine per week   Drug use: Never   Sexual activity: Yes  Other Topics Concern   Not on file  Social History Narrative   Married, 2 grown children.  No grandchildren.   Masters Degree: Public affairs consultant.   [name]; retired Art gallery manager.   Alc: 2 drinks per week.   No tob.   Social Determinants of Health   Financial Resource Strain: Low Risk  (09/08/2022)   Overall Financial Resource Strain (CARDIA)    Difficulty of Paying Living Expenses: Not hard at all  Food Insecurity: No Food  Insecurity (09/08/2022)   Hunger Vital Sign    Worried About Running Out of Food in the Last Year: Never true    Ran Out of Food in the Last Year: Never true  Transportation Needs: No Transportation Needs (09/08/2022)   PRAPARE - Administrator, Civil Service (Medical): No    Lack of Transportation (Non-Medical): No  Physical Activity: Insufficiently Active (09/08/2022)   Exercise Vital Sign    Days of Exercise per Week: 4 days    Minutes of Exercise per Session: 30 min  Stress: No Stress Concern Present (09/08/2022)   [employer] of Occupational Health - Occupational Stress Questionnaire    Feeling of Stress : Only a little  Social Connections: Moderately Integrated (09/08/2022)   Social Connection and Isolation Panel [NHANES]    Frequency of Communication  with Friends and Family: More than three times a week    Frequency of Social Gatherings with Friends and Family: Patient declined    Attends Religious Services: Never    Database administrator or Organizations: Yes    Attends Engineer, structural: More than 4 times per year    Marital Status: Married  Catering manager Violence: Not At Risk (07/01/2022)   Humiliation, Afraid, Rape, and Kick questionnaire    Fear of Current or Ex-Partner: No    Emotionally Abused: No    Physically Abused: No    Sexually Abused: No     Review of Systems: General: negative for chills, fever, night sweats or weight changes.  Cardiovascular: negative for chest pain, dyspnea on exertion, edema, orthopnea, palpitations, paroxysmal nocturnal dyspnea or shortness of breath Dermatological: negative for rash Respiratory: negative for cough or wheezing Urologic: negative for hematuria Abdominal: negative for nausea, vomiting, diarrhea, bright red blood per rectum, melena, or hematemesis Neurologic: negative for visual changes, syncope, or dizziness All other systems reviewed and are otherwise negative except as noted above.    Blood pressure 118/76, pulse 75, height 6' 0.6" (1.844 m), weight 193 lb (87.5 kg).  General appearance: alert and no distress Neck: no adenopathy, no carotid bruit, no JVD, supple, symmetrical, trachea midline, and thyroid not enlarged, symmetric, no tenderness/mass/nodules Lungs: clear to auscultation bilaterally Heart: regular rate and rhythm, S1, S2 normal, no murmur, click, rub or gallop Extremities: extremities normal, atraumatic, no cyanosis or edema Pulses: 2+ and symmetric Skin: Skin color, texture, turgor normal. No rashes or lesions Neurologic: Grossly normal  EKG EKG Interpretation Date/Time:  Monday May 24 2023 10:06:58 EST Ventricular Rate:  75 PR Interval:  150 QRS Duration:  118 QT Interval:  398 QTC Calculation: 444 R Axis:   56  Text  Interpretation: Normal sinus rhythm Incomplete right bundle branch block Nonspecific ST abnormality When compared with ECG of 26-Mar-2006 09:11, Non-specific change in ST segment in Anterior leads Confirmed by Nanetta Batty [phone]) on 05/24/2023 10:07:58 AM    ASSESSMENT AND PLAN:   Hyperlipidemia History of hyperlipidemia on statin therapy with lipid profile performed 12/21/2022 revealing total cholesterol 114, LDL 46 and HDL 49.  Coronary artery disease History of CAD status post LAD stenting important [place] in 1994.  He had CABG x 4 at [facility] in 2004 and subsequent negative stress test at [facility] in 2013.  He is active and completely asymptomatic.  Right bundle branch block Chronic     Runell Gess MD [facility], [facility] 05/24/2023 10:16 AM

## 2023-05-24 NOTE — Assessment & Plan Note (Signed)
History of CAD status post LAD stenting [place] in 1994.  He had CABG x 4 at [facility] in 2004 and subsequent negative stress test at [facility] in 2013.  He is active and completely asymptomatic.

## 2023-06-07 ENCOUNTER — Encounter: Payer: Self-pay | Admitting: Internal Medicine

## 2023-06-07 ENCOUNTER — Ambulatory Visit: Payer: Medicare HMO | Admitting: Internal Medicine

## 2023-06-07 VITALS — BP 126/72 | HR 81 | Ht 72.0 in | Wt 194.8 lb

## 2023-06-07 DIAGNOSIS — K648 Other hemorrhoids: Secondary | ICD-10-CM

## 2023-06-07 NOTE — Progress Notes (Signed)
Gavin [name] is a 72 year old male with a history of multiple adenomatous and sessile serrated polyps, symptomatic internal hemorrhoids, family history of colon cancer in the patient's brother, intermittent GERD who is here for follow-up  Here for banding visit #3  Symptoms prior to banding: Prolapse with skin tag and irritation  Reports excellent response to banding thus far and very happy with result  Since last visit started 20 mg famotidine at bedtime has had complete resolution of nocturnal heartburn.   PROCEDURE NOTE:  The patient presents with symptomatic grade 2 internal hemorrhoids, requesting rubber band ligation of his hemorrhoidal disease.  All risks, benefits and alternative forms of therapy were described and informed consent was obtained.   The anorectum was pre-medicated with 0.125% nitroglycerin ointment The decision was made to band the RA (RP and LL previously banded) internal hemorrhoid, and the [facility] O'Regan System was used to perform band ligation without complication.   Digital anorectal examination was then performed to assure proper positioning of the band, and to adjust the banded tissue as required.  The patient was discharged home without pain or other issues.  Dietary and behavioral recommendations were given and along with follow-up instructions.     The following adjunctive treatments were recommended: Continue psyllium Continue famotidine 20 mg at bedtime as needed  The patient will return as needed for follow-up and possible additional banding as required. No complications were encountered and the patient tolerated the procedure well.

## 2023-06-07 NOTE — Patient Instructions (Signed)

## 2023-06-22 ENCOUNTER — Encounter: Payer: Medicare HMO | Admitting: Internal Medicine

## 2023-06-28 ENCOUNTER — Encounter: Payer: Self-pay | Admitting: Family Medicine

## 2023-06-28 ENCOUNTER — Ambulatory Visit: Payer: Medicare HMO | Admitting: Family Medicine

## 2023-06-28 VITALS — BP 109/70 | HR 69 | Wt 191.0 lb

## 2023-06-28 DIAGNOSIS — E7800 Pure hypercholesterolemia, unspecified: Secondary | ICD-10-CM | POA: Diagnosis not present

## 2023-06-28 DIAGNOSIS — R7303 Prediabetes: Secondary | ICD-10-CM

## 2023-06-28 DIAGNOSIS — L84 Corns and callosities: Secondary | ICD-10-CM | POA: Diagnosis not present

## 2023-06-28 LAB — COMPREHENSIVE METABOLIC PANEL
ALT: 21 U/L (ref 0–53)
AST: 19 U/L (ref 0–37)
Albumin: 4.3 g/dL (ref 3.5–5.2)
Alkaline Phosphatase: 81 U/L (ref 39–117)
BUN: 15 mg/dL (ref 6–23)
CO2: 29 meq/L (ref 19–32)
Calcium: 9.3 mg/dL (ref 8.4–10.5)
Chloride: 103 meq/L (ref 96–112)
Creatinine, Ser: 1.04 mg/dL (ref 0.40–1.50)
GFR: 71.31 mL/min (ref >60.00)
Glucose, Bld: 107 mg/dL — ABNORMAL HIGH (ref 70–99)
Potassium: 4.5 meq/L (ref 3.5–5.1)
Sodium: 140 meq/L (ref 135–145)
Total Bilirubin: 0.8 mg/dL (ref 0.2–1.2)
Total Protein: 6.4 g/dL (ref 6.0–8.3)

## 2023-06-28 LAB — POCT GLYCOSYLATED HEMOGLOBIN (HGB A1C)
HbA1c POC (<> result, manual entry): 5.7 % (ref 4.0–5.6)
HbA1c, POC (controlled diabetic range): 5.7 % (ref 0.0–7.0)
HbA1c, POC (prediabetic range): 5.7 % (ref 5.7–6.4)
Hemoglobin A1C: 5.7 % — AB (ref 4.0–5.6)

## 2023-06-28 LAB — LIPID PANEL
Cholesterol: 126 mg/dL (ref 0–200)
HDL: 51.6 mg/dL (ref >39.00)
LDL Cholesterol: 53 mg/dL (ref 0–99)
NonHDL: 74.55
Total CHOL/HDL Ratio: 2
Triglycerides: 108 mg/dL (ref 0.0–149.0)
VLDL: 21.6 mg/dL (ref 0.0–40.0)

## 2023-06-28 MED ORDER — ATORVASTATIN CALCIUM 40 MG PO TABS: 40.0000 mg | ORAL_TABLET | Freq: Every day | ORAL | 3 refills | Status: DC

## 2023-06-28 NOTE — Progress Notes (Signed)
OFFICE VISIT  06/28/2023  CC:  Chief Complaint  Patient presents with   Medical Management of Chronic Issues    Patient is a 72 y.o. male who presents for 55-month follow-up prediabetes and hyperlipidemia. A/P as of last visit: "#1 prediabetes. Great TLC. POC Hba1c today is 5.8%.   #2 hyperlipidemia. Doing great on a atorvastatin 40 mg a day. Lipid panel and hepatic panel today."  INTERIM HX: Gavin [name] feels good other than his invisaline discomfort.  He is always thinking about them and they affect the way he chews.  He no longer enjoys eating very much. He gets these out in January and hopes this will make him feel a whole lot better. On a good note, he has been exercising more and combined with this dietary change he has lost significant amount of weight.  ROS as above, plus--> no fevers, no CP, no SOB, no wheezing, no cough, no dizziness, no HAs, no rashes, no melena/hematochezia.  No polyuria or polydipsia.  No myalgias or arthralgias.  No focal weakness, paresthesias, or tremors.  No acute vision or hearing abnormalities.  No dysuria or unusual/new urinary urgency or frequency.  No recent changes in lower legs. No n/v/d or abd pain.  No palpitations.    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   Cataract    Bilat: has lens implants bilat.   COVID-19 virus infection 04/05/2021   Depression    Elevated PSA    prostate bx benign 02/2023   Family history of colon cancer    Brother: in his 30s.   Hemorrhoids    History of fusion of cervical spine    History of left lateral epicondylitis    History of retinal detachment    OU   Hyperlipidemia    Migraines    Myocardial infarct (HCC)    2004, when pt's stent clotted.  He got CABG at  [facility] at that time.   Prediabetes    04/2019-->HbA1c 5.9% (unchanged compared to 2/3 yrs prior). 5.8% 11/2019  6.2% 05/2020. A1c 5.7% 11/2020.   Pseudophakia    RBBB     Past Surgical History:  Procedure Laterality Date   CARDIOVASCULAR STRESS TEST  2013   No ischemia.  EF 70%.   CATARACT EXTRACTION, BILATERAL     with lens implants   CERVICAL FUSION  2006   C5/6   CHOLECYSTECTOMY  2004   COLONOSCOPY  09/19/2010; 11/02/13; 04/28/18   2012: repeat 3 yrs,aden[place].  2015 same.  2019 same.  2022 same->Recall 3-5 yrs.   CORONARY ARTERY BYPASS GRAFT  2004   CABGx4   CORONARY STENT PLACEMENT     BMS in 1990s.   POLYPECTOMY     PROSTATE BIOPSY     03/04/23 benign   RETINAL DETACHMENT SURGERY N/A    2011 & 2013 both eyes affected   TONSILLECTOMY  1956    Outpatient Medications Prior to Visit  Medication Sig Dispense Refill   aspirin EC 81 MG tablet Take 81 mg by mouth daily.     atorvastatin (LIPITOR) 40 MG tablet Take 1 tablet (40 mg total) by mouth daily. 90 tablet 3   Cholecalciferol (VITAMIN D PO) Take 1 capsule by  mouth daily.     Coenzyme Q10 (COQ10 PO) Take 300 mg by mouth daily.     finasteride (PROSCAR) 5 MG tablet Take 5 mg by mouth daily.     tamsulosin (FLOMAX) 0.4 MG CAPS capsule Take 0.4 mg by mouth daily.     triamcinolone cream (KENALOG) 0.1 % Apply 1 Application topically 2 (two) times daily.     hydrocortisone cream 0.5 % Apply 1 Application topically as needed. (Patient not taking: Reported on 06/28/2023)     No facility-administered medications prior to visit.    Allergies  Allergen Reactions   Penicillins Anaphylaxis    Review of Systems As per HPI  PE:    06/28/2023    8:27 AM 06/07/2023    3:40 PM 05/24/2023   10:01 AM  Vitals with BMI  Height  6\' 0"  6' 0.6"  Weight 191 lbs 194 lbs 13 oz 193 lbs  BMI 25.9 26.41 25.75  Systolic 109 126 578  Diastolic 70 72 76  Pulse 69 81 75     Physical Exam  Gen: Alert, well appearing.  Patient is oriented  to person, place, time, and situation. AFFECT: pleasant, lucid thought and speech. Cardiovascular: Regular rhythm and rate occasional ectopy, no murmur. Extremities no edema. He has a callus at the base of the fifth metatarsal on the right foot.  LABS:  Last CBC Lab Results  Component Value Date   WBC 6.0 12/21/2022   HGB 15.1 12/21/2022   HCT 44.7 12/21/2022   MCV 86.0 12/21/2022   MCH 29.3 06/19/2022   RDW 13.1 12/21/2022   PLT 228.0 12/21/2022   Last metabolic panel Lab Results  Component Value Date   GLUCOSE 103 (H) 12/21/2022   NA 141 12/21/2022   K 4.5 12/21/2022   CL 105 12/21/2022   CO2 25 12/21/2022   BUN 17 12/21/2022   CREATININE 1.11 12/21/2022   GFR 72.12 11/17/2021   CALCIUM 9.3 12/21/2022   PROT 6.4 12/21/2022   ALBUMIN 4.3 11/17/2021   BILITOT 1.0 12/21/2022   ALKPHOS 71 11/17/2021   AST 22 12/21/2022   ALT 23 12/21/2022   Last lipids Lab Results  Component Value Date   CHOL 114 12/21/2022   HDL 49 12/21/2022   LDLCALC 46 12/21/2022   TRIG 108 12/21/2022   CHOLHDL 2.3 12/21/2022   Last hemoglobin A1c Lab Results  Component Value Date   HGBA1C 5.7 (A) 06/28/2023   HGBA1C 5.7 06/28/2023   HGBA1C 5.7 06/28/2023   HGBA1C 5.7 06/28/2023   Last thyroid functions Lab Results  Component Value Date   TSH 3.17 06/10/2020   Lab Results  Component Value Date   PSA 13.10 01/18/2023   PSA 10.38 (H) 12/21/2022   PSA 1.76 11/17/2021   IMPRESSION AND PLAN:  #1 prediabetes. Great TLC. POC Hba1c today is 5.7%.   #2 hyperlipidemia. Doing great on a atorvastatin 40 mg a day. Lipid panel and hepatic panel today.  #3 painful plantar callus.  He has been walking a lot more lately and old shoes. He is getting give it a few days of rest and see how it goes. If does not improve he will return and I will shave this down.  An After Visit Summary was printed and given to the patient.  FOLLOW UP: No follow-ups on file.  Signed:  Santiago Bumpers, MD            06/28/2023

## 2023-07-07 ENCOUNTER — Ambulatory Visit: Payer: Medicare HMO | Admitting: *Deleted

## 2023-07-07 DIAGNOSIS — Z0000 Encounter for general adult medical examination without abnormal findings: Secondary | ICD-10-CM | POA: Diagnosis not present

## 2023-07-07 NOTE — Patient Instructions (Signed)
Mr. Gandy , Thank you for taking time to come for your Medicare Wellness Visit. I appreciate your ongoing commitment to your health goals. Please review the following plan we discussed and let me know if I can assist you in the future.   Screening recommendations/referrals: Colonoscopy: up to date Recommended yearly ophthalmology/optometry visit for glaucoma screening and checkup Recommended yearly dental visit for hygiene and checkup  Vaccinations: Influenza vaccine: up to date Pneumococcal vaccine: up to date Tdap vaccine: up to date Shingles vaccine: up to date    Advanced directives: Education provided    Preventive Care 65 Years and Older, Male Preventive care refers to lifestyle choices and visits with your health care provider that can promote health and wellness. What does preventive care include? A yearly physical exam. This is also called an annual well check. Dental exams once or twice a year. Routine eye exams. Ask your health care provider how often you should have your eyes checked. Personal lifestyle choices, including: Daily care of your teeth and gums. Regular physical activity. Eating a healthy diet. Avoiding tobacco and drug use. Limiting alcohol use. Practicing safe sex. Taking low doses of aspirin every day. Taking vitamin and mineral supplements as recommended by your health care provider. What happens during an annual well check? The services and screenings done by your health care provider during your annual well check will depend on your age, overall health, lifestyle risk factors, and family history of disease. Counseling  Your health care provider may ask you questions about your: Alcohol use. Tobacco use. Drug use. Emotional well-being. Home and relationship well-being. Sexual activity. Eating habits. History of falls. Memory and ability to understand (cognition). Work and work Astronomer. Screening  You may have the following tests or  measurements: Height, weight, and BMI. Blood pressure. Lipid and cholesterol levels. These may be checked every 5 years, or more frequently if you are over 33 years old. Skin check. Lung cancer screening. You may have this screening every year starting at age 23 if you have a 30-pack-year history of smoking and currently smoke or have quit within the past 15 years. Fecal occult blood test (FOBT) of the stool. You may have this test every year starting at age 47. Flexible sigmoidoscopy or colonoscopy. You may have a sigmoidoscopy every 5 years or a colonoscopy every 10 years starting at age 76. Prostate cancer screening. Recommendations will vary depending on your family history and other risks. Hepatitis C blood test. Hepatitis B blood test. Sexually transmitted disease (STD) testing. Diabetes screening. This is done by checking your blood sugar (glucose) after you have not eaten for a while (fasting). You may have this done every 1-3 years. Abdominal aortic aneurysm (AAA) screening. You may need this if you are a current or former smoker. Osteoporosis. You may be screened starting at age 54 if you are at high risk. Talk with your health care provider about your test results, treatment options, and if necessary, the need for more tests. Vaccines  Your health care provider may recommend certain vaccines, such as: Influenza vaccine. This is recommended every year. Tetanus, diphtheria, and acellular pertussis (Tdap, Td) vaccine. You may need a Td booster every 10 years. Zoster vaccine. You may need this after age 73. Pneumococcal 13-valent conjugate (PCV13) vaccine. One dose is recommended after age 56. Pneumococcal polysaccharide (PPSV23) vaccine. One dose is recommended after age 29. Talk to your health care provider about which screenings and vaccines you need and how often you need them.  This information is not intended to replace advice given to you by your health care provider. Make sure  you discuss any questions you have with your health care provider. Document Released: 08/02/2015 Document Revised: 03/25/2016 Document Reviewed: 05/07/2015 Elsevier Interactive Patient Education  2017 [employer].  Fall Prevention in the Home Falls can cause injuries. They can happen to people of all ages. There are many things you can do to make your home safe and to help prevent falls. What can I do on the outside of my home? Regularly fix the edges of walkways and driveways and fix any cracks. Remove anything that might make you trip as you walk through a door, such as a raised step or threshold. Trim any bushes or trees on the path to your home. Use bright outdoor lighting. Clear any walking paths of anything that might make someone trip, such as rocks or tools. Regularly check to see if handrails are loose or broken. Make sure that both sides of any steps have handrails. Any raised decks and porches should have guardrails on the edges. Have any leaves, snow, or ice cleared regularly. Use sand or salt on walking paths during winter. Clean up any spills in your garage right away. This includes oil or grease spills. What can I do in the bathroom? Use night lights. Install grab bars by the toilet and in the tub and shower. Do not use towel bars as grab bars. Use non-skid mats or decals in the tub or shower. If you need to sit down in the shower, use a plastic, non-slip stool. Keep the floor dry. Clean up any water that spills on the floor as soon as it happens. Remove soap buildup in the tub or shower regularly. Attach bath mats securely with double-sided non-slip rug tape. Do not have throw rugs and other things on the floor that can make you trip. What can I do in the bedroom? Use night lights. Make sure that you have a light by your bed that is easy to reach. Do not use any sheets or blankets that are too big for your bed. They should not hang down onto the floor. Have a firm  chair that has side arms. You can use this for support while you get dressed. Do not have throw rugs and other things on the floor that can make you trip. What can I do in the kitchen? Clean up any spills right away. Avoid walking on wet floors. Keep items that you use a lot in easy-to-reach places. If you need to reach something above you, use a strong step stool that has a grab bar. Keep electrical cords out of the way. Do not use floor polish or wax that makes floors slippery. If you must use wax, use non-skid floor wax. Do not have throw rugs and other things on the floor that can make you trip. What can I do with my stairs? Do not leave any items on the stairs. Make sure that there are handrails on both sides of the stairs and use them. Fix handrails that are broken or loose. Make sure that handrails are as long as the stairways. Check any carpeting to make sure that it is firmly attached to the stairs. Fix any carpet that is loose or worn. Avoid having throw rugs at the top or bottom of the stairs. If you do have throw rugs, attach them to the floor with carpet tape. Make sure that you have a light switch at the  top of the stairs and the bottom of the stairs. If you do not have them, ask someone to add them for you. What else can I do to help prevent falls? Wear shoes that: Do not have high heels. Have rubber bottoms. Are comfortable and fit you well. Are closed at the toe. Do not wear sandals. If you use a stepladder: Make sure that it is fully opened. Do not climb a closed stepladder. Make sure that both sides of the stepladder are locked into place. Ask someone to hold it for you, if possible. Clearly mark and make sure that you can see: Any grab bars or handrails. First and last steps. Where the edge of each step is. Use tools that help you move around (mobility aids) if they are needed. These include: Canes. Walkers. Scooters. Crutches. Turn on the lights when you go  into a dark area. Replace any light bulbs as soon as they burn out. Set up your furniture so you have a clear path. Avoid moving your furniture around. If any of your floors are uneven, fix them. If there are any pets around you, be aware of where they are. Review your medicines with your doctor. Some medicines can make you feel dizzy. This can increase your chance of falling. Ask your doctor what other things that you can do to help prevent falls. This information is not intended to replace advice given to you by your health care provider. Make sure you discuss any questions you have with your health care provider. Document Released: 05/02/2009 Document Revised: 12/12/2015 Document Reviewed: 08/10/2014 Elsevier Interactive Patient Education  2017 [employer].

## 2023-07-07 NOTE — Progress Notes (Signed)
Subjective:   Gavin [name] is a 72 y.o. male who presents for Medicare Annual/Subsequent preventive examination.  Visit Complete: Virtual I connected with  [name] on 07/07/23 by a audio enabled telemedicine application and verified that I am speaking with the correct person using two identifiers.  Patient Location: Home  Provider Location: Home Office  I discussed the limitations of evaluation and management by telemedicine. The patient expressed understanding and agreed to proceed.  Vital Signs: Because this visit was a virtual/telehealth visit, some criteria may be missing or patient reported. Any vitals not documented were not able to be obtained and vitals that have been documented are patient reported.    Cardiac Risk Factors include: advanced age (>59men, >69 women);male gender     Objective:    There were no vitals filed for this visit. There is no height or weight on file to calculate BMI.     07/07/2023    1:44 PM 07/01/2022    8:44 AM 06/04/2021    1:10 PM 05/29/2020    1:34 PM 05/02/2019   10:03 AM  Advanced Directives  Does Patient Have a Medical Advance Directive? No No Yes No No  Does patient want to make changes to medical advance directive?   Yes (MAU/Ambulatory/Procedural Areas - Information given)    Would patient like information on creating a medical advance directive? No - Patient declined No - Patient declined  No - Patient declined No - Patient declined    Current Medications (verified) Outpatient Encounter Medications as of 07/07/2023  Medication Sig   aspirin EC 81 MG tablet Take 81 mg by mouth daily.   atorvastatin (LIPITOR) 40 MG tablet Take 1 tablet (40 mg total) by mouth daily.   Cholecalciferol (VITAMIN D PO) Take 1 capsule by mouth daily.   Coenzyme Q10 (COQ10 PO) Take 300 mg by mouth daily.   finasteride (PROSCAR) 5 MG tablet Take 5 mg by mouth daily.   hydrocortisone cream 0.5 % Apply 1 Application topically as  needed.   tamsulosin (FLOMAX) 0.4 MG CAPS capsule Take 0.4 mg by mouth daily.   triamcinolone cream (KENALOG) 0.1 % Apply 1 Application topically 2 (two) times daily.   No facility-administered encounter medications on file as of 07/07/2023.    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   Cataract    Bilat: has lens implants bilat.   COVID-19 virus infection 04/05/2021   Depression    Elevated PSA    prostate bx benign 02/2023   Family history of colon cancer    Brother: in his 30s.   Hemorrhoids    History of fusion of cervical spine    History of left lateral epicondylitis    History of retinal detachment    OU   Hyperlipidemia    Migraines    Myocardial infarct (HCC)    2004, when pt's stent clotted.  He got CABG at [facility] at that time.   Prediabetes    04/2019-->HbA1c 5.9% (unchanged compared to 2/3 yrs prior). 5.8% 11/2019  6.2% 05/2020. A1c 5.7% 11/2020.   Pseudophakia    RBBB    Past Surgical History:  Procedure Laterality Date  CARDIOVASCULAR STRESS TEST  2013   No ischemia.  EF 70%.   CATARACT EXTRACTION, BILATERAL     with lens implants   CERVICAL FUSION  2006   C5/6   CHOLECYSTECTOMY  2004   COLONOSCOPY  09/19/2010; 11/02/13; 04/28/18   2012: repeat 3 yrs,[place].  2015 same.  2019 same.  2022 same->Recall 3-5 yrs.   CORONARY ARTERY BYPASS GRAFT  2004   CABGx4   CORONARY STENT PLACEMENT     BMS in 1990s.   POLYPECTOMY     PROSTATE BIOPSY     03/04/23 benign   RETINAL DETACHMENT SURGERY N/A    2011 & 2013 both eyes affected   TONSILLECTOMY  1956   Family History  Problem Relation Age of Onset   Arthritis Mother    Diabetes Mother    Osteoporosis Mother    Depression  Father    Heart attack Father    Heart disease Father    Hyperlipidemia Father    Hypertension Father    Stroke Father    Aortic aneurysm Father    Cancer Brother    Heart disease Brother    Hyperlipidemia Brother    Hypertension Brother    Colon cancer Brother 30   Colon polyps Brother    Colon polyps Brother    Colon cancer Brother    Diabetes Maternal Grandmother    Early death Paternal Grandfather    Esophageal cancer Neg Hx    Rectal cancer Neg Hx    Stomach cancer Neg Hx    Social History   Socioeconomic History   Marital status: Married    Spouse name: Not on file   Number of children: Not on file   Years of education: Not on file   Highest education level: Master's degree (e.g., MA, MS, MEng, MEd, MSW, MBA)  Occupational History   Not on file  Tobacco Use   Smoking status: Never   Smokeless tobacco: Never  Vaping Use   Vaping status: Never Used  Substance and Sexual Activity   Alcohol use: Yes    Alcohol/week: 1.0 standard drink of alcohol    Types: 1 Glasses of wine per week    Comment: glass of wine per week   Drug use: Never   Sexual activity: Yes  Other Topics Concern   Not on file  Social History Narrative   Married, 2 grown children.  No grandchildren.   Masters Degree: Public affairs consultant.   [name]; retired Art gallery manager.   Alc: 2 drinks per week.   No tob.   Social Drivers of Corporate investment banker Strain: Low Risk  (07/07/2023)   Overall Financial Resource Strain (CARDIA)    Difficulty of Paying Living Expenses: Not hard at all  Food Insecurity: No Food Insecurity (07/07/2023)   Hunger Vital Sign    Worried About Running Out of Food in the Last Year: Never true    Ran Out of Food in the Last Year: Never true  Transportation Needs: No Transportation Needs (07/07/2023)   PRAPARE - Administrator, Civil Service (Medical): No    Lack of Transportation (Non-Medical): No  Physical Activity: Sufficiently Active (07/07/2023)   Exercise Vital  Sign    Days of Exercise per Week: 4 days    Minutes of Exercise per Session: 40 min  Stress: No Stress Concern Present (07/07/2023)   [employer] of Occupational Health - Occupational Stress Questionnaire    Feeling of Stress : Not at all  Social Connections:  Moderately Isolated (07/07/2023)   Social Connection and Isolation Panel [NHANES]    Frequency of Communication with Friends and Family: Three times a week    Frequency of Social Gatherings with Friends and Family: Once a week    Attends Religious Services: Never    Database administrator or Organizations: No    Attends Engineer, structural: Never    Marital Status: Married    Tobacco Counseling Counseling given: Not Answered   Clinical Intake:  Pre-visit preparation completed: Yes  Pain : No/denies pain     Diabetes: No  How often do you need to have someone help you when you read instructions, pamphlets, or other written materials from your doctor or pharmacy?: 1 - Never  Interpreter Needed?: No  Information entered by :: Remi Haggard LPN   Activities of Daily Living    07/07/2023    1:44 PM  In your present state of health, do you have any difficulty performing the following activities:  Hearing? 0  Vision? 0  Difficulty concentrating or making decisions? 0  Walking or climbing stairs? 0  Preparing Food and eating ? N  Using the Toilet? N  In the past six months, have you accidently leaked urine? N  Do you have problems with loss of bowel control? N  Managing your Medications? N  Managing your Finances? N  Housekeeping or managing your Housekeeping? N    Patient Care Team: Jeoffrey Massed, MD as PCP - General (Family Medicine) Runell Gess, MD as PCP - Cardiology (Cardiology) Runell Gess, MD as Consulting Physician (Cardiology) Pyrtle, Carie Caddy, MD as Consulting Physician (Gastroenterology) Rene Paci, MD as Consulting Physician (Urology)  Indicate any  recent Medical Services you may have received from other than [facility] providers in the past year (date may be approximate).     Assessment:   This is a routine wellness examination for Juden.  Hearing/Vision screen Hearing Screening - Comments:: No trouble hearing Vision Screening - Comments:: Up to date [place]   Goals Addressed             This Visit's Progress    Patient Stated       Stay healthy  Finish hobbies       Depression Screen    07/07/2023    1:43 PM 12/21/2022    8:34 AM 07/01/2022    8:37 AM 06/19/2022    8:26 AM 11/17/2021    8:18 AM 06/04/2021    1:08 PM 05/23/2021    9:23 AM  PHQ 2/9 Scores  PHQ - 2 Score 2 0 0 0 0 0 0  PHQ- 9 Score 3          Fall Risk    07/07/2023    1:38 PM 12/21/2022    8:34 AM 09/08/2022   10:14 AM 06/19/2022    8:26 AM 11/17/2021    8:18 AM  Fall Risk   Falls in the past year? 0 0 0 0 0  Number falls in past yr: 0 0  0 0  Injury with Fall? 0 0  0 0  Risk for fall due to :  No Fall Risks  No Fall Risks   Follow up Falls evaluation completed;Education provided;Falls prevention discussed Falls evaluation completed  Falls evaluation completed Falls evaluation completed    MEDICARE RISK AT HOME: Medicare Risk at Home Any stairs in or around the home?: Yes If so, are there any without handrails?: No Home free of  loose throw rugs in walkways, pet beds, electrical cords, etc?: Yes Adequate lighting in your home to reduce risk of falls?: Yes Life alert?: No Use of a cane, walker or w/c?: No Grab bars in the bathroom?: No Shower chair or bench in shower?: No Elevated toilet seat or a handicapped toilet?: No  TIMED UP AND GO:  Was the test performed?  No    Cognitive Function:    05/02/2019   10:04 AM  MMSE - Mini Mental State Exam  Orientation to time 5  Orientation to Place 5  Registration 3  Attention/ Calculation 5  Recall 3  Language- name 2 objects 2  Language- repeat 1  Language- follow 3 step command 3   Language- read & follow direction 1  Write a sentence 1  Copy design 1  Total score 30        07/07/2023    1:41 PM 07/01/2022    8:40 AM 06/04/2021    1:13 PM  6CIT Screen  What Year? 0 points 0 points 0 points  What month? 0 points 0 points 0 points  What time? 0 points 0 points 0 points  Count back from 20 0 points 0 points 0 points  Months in reverse 0 points 0 points 0 points  Repeat phrase 0 points 0 points 0 points  Total Score 0 points 0 points 0 points    Immunizations Immunization History  Administered Date(s) Administered   Fluad Quad(high Dose 65+) 04/05/2019   Hepatitis A 02/07/2007, 07/11/2007   Hepatitis B 01/07/2007, 02/07/2007, 07/11/2007   IPV 01/07/2007   Influenza, High Dose Seasonal PF 04/07/2018, 03/16/2020, 04/08/2022   Influenza-Unspecified 05/09/2015, 03/27/2016, 04/28/2017, 03/12/2021, 03/22/2023   MMR 01/07/2007   Moderna Covid-19 Vaccine Bivalent Booster 41yrs & up 04/10/2022   PFIZER(Purple Top)SARS-COV-2 Vaccination 09/11/2019, 10/02/2019, 04/12/2020, 11/06/2020, 03/22/2023   PPD Test 07/20/2010   Pfizer Covid-19 Vaccine Bivalent Booster 72yrs & up 12/03/2020, 07/24/2021   Pneumococcal Conjugate-13 05/09/2015   Pneumococcal Polysaccharide-23 03/28/2014   Pneumococcal-Unspecified 07/21/2015   Td 07/21/2015, 10/28/2021   Tdap 03/28/2014   Typhoid Live 01/07/2007, 09/09/2010   Unspecified SARS-COV-2 Vaccination 03/18/2023   Zoster Recombinant(Shingrix) 06/19/2018, 09/16/2018   Zoster, Live 03/28/2014    TDAP status: Up to date  Flu Vaccine status: Up to date  Pneumococcal vaccine status: Up to date  Covid-19 vaccine status: Completed vaccines  Qualifies for Shingles Vaccine? Yes   Zostavax completed Yes   Shingrix Completed?: Yes  Screening Tests Health Maintenance  Topic Date Due   Pneumonia Vaccine 5+ Years old (3 of 3 - PPSV23 or PCV20) 12/21/2023 (Originally 03/29/2019)   COVID-19 Vaccine (9 - 2024-25 season) 07/12/2024  (Originally 05/17/2023)   OPHTHALMOLOGY EXAM  10/19/2023   Medicare Annual Wellness (AWV)  07/06/2024   Colonoscopy  05/09/2026   DTaP/Tdap/Td (4 - Td or Tdap) 10/29/2031   INFLUENZA VACCINE  Completed   Zoster Vaccines- Shingrix  Completed   HPV VACCINES  Aged Out   Hepatitis C Screening  Discontinued    Health Maintenance  There are no preventive care reminders to display for this patient.   Colorectal cancer screening: Type of screening: Colonoscopy. Completed 2022. Repeat every 5 years  Lung Cancer Screening: (Low Dose CT Chest recommended if Age 35-80 years, 20 pack-year currently smoking OR have quit w/in 15years.) does not qualify.   Lung Cancer Screening Referral:   Additional Screening:  Hepatitis C Screening: does not qualify;   Vision Screening: Recommended annual ophthalmology exams for early detection  of glaucoma and other disorders of the eye. Is the patient up to date with their annual eye exam?  Yes  Who is the provider or what is the name of the office in which the patient attends annual eye exams? [facility] If pt is not established with a provider, would they like to be referred to a provider to establish care? No .   Dental Screening: Recommended annual dental exams for proper oral hygiene    Community Resource Referral / Chronic Care Management: CRR required this visit?  No   CCM required this visit?  No     Plan:     I have personally reviewed and noted the following in the patient's chart:   Medical and social history Use of alcohol, tobacco or illicit drugs  Current medications and supplements including opioid prescriptions. Patient is not currently taking opioid prescriptions. Functional ability and status Nutritional status Physical activity Advanced directives List of other physicians Hospitalizations, surgeries, and ER visits in previous 12 months Vitals Screenings to include cognitive, depression, and falls Referrals and  appointments  In addition, I have reviewed and discussed with patient certain preventive protocols, quality metrics, and best practice recommendations. A written personalized care plan for preventive services as well as general preventive health recommendations were provided to patient.     Remi Haggard, LPN   [id number]/[id number]   After Visit Summary: (MyChart) Due to this being a telephonic visit, the after visit summary with patients personalized plan was offered to patient via MyChart   Nurse Notes:

## 2023-07-18 ENCOUNTER — Encounter: Payer: Self-pay | Admitting: Family Medicine

## 2023-07-19 NOTE — Telephone Encounter (Signed)
OK to see me.  Prefer 40 min appt b/c procedure likely

## 2023-07-19 NOTE — Telephone Encounter (Signed)
Pt scheduled in 20 min slot,. Confirmed with PCP ok to keep as 20 min OB

## 2023-07-20 ENCOUNTER — Ambulatory Visit: Payer: Medicare HMO | Admitting: Family Medicine

## 2023-07-30 ENCOUNTER — Other Ambulatory Visit: Payer: Self-pay | Admitting: Medical Genetics

## 2023-08-20 ENCOUNTER — Other Ambulatory Visit (HOSPITAL_COMMUNITY)
Admission: RE | Admit: 2023-08-20 | Discharge: 2023-08-20 | Disposition: A | Discharge status: Home / Self Care | Payer: Self-pay | Source: Ambulatory Visit | Attending: Medical Genetics | Admitting: Medical Genetics

## 2023-08-30 ENCOUNTER — Encounter: Payer: Self-pay | Admitting: Family Medicine

## 2023-08-30 LAB — GENECONNECT MOLECULAR SCREEN: Genetic Analysis Overall Interpretation: NEGATIVE

## 2023-08-31 ENCOUNTER — Other Ambulatory Visit: Payer: Self-pay

## 2023-09-01 ENCOUNTER — Ambulatory Visit: Payer: Medicare HMO | Admitting: Family Medicine

## 2023-09-01 ENCOUNTER — Encounter: Payer: Self-pay | Admitting: Family Medicine

## 2023-09-01 VITALS — BP 107/71 | HR 72 | Temp 98.1°F | Ht 72.0 in | Wt 191.6 lb

## 2023-09-01 DIAGNOSIS — K4090 Unilateral inguinal hernia, without obstruction or gangrene, not specified as recurrent: Secondary | ICD-10-CM | POA: Diagnosis not present

## 2023-09-01 NOTE — Progress Notes (Signed)
OFFICE VISIT  09/01/2023  CC:  Chief Complaint  Patient presents with   Abdominal Pain    Pt has possible hernia that developed 2 weeks ago.     Patient is a 73 y.o. male who presents for concern for hernia.  HPI: 2 weeks ago his wife was rolling across him in bed and her elbow accidentally hit him in the left groin area. It did hurt significantly and this has gradually been getting better.  The last several days he has noted a small lump in the area. He has no abdominal pain or distention.  No nausea or vomiting.  His bowels are moving normally.  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   Cataract    Bilat: has lens implants bilat.   COVID-19 virus infection 04/05/2021   Depression    Elevated PSA    prostate bx benign 02/2023   Family history of colon cancer    Brother: in his 30s.   Hemorrhoids    History of fusion of cervical spine    History of left lateral epicondylitis    History of retinal detachment    OU   Hyperlipidemia    Migraines    Myocardial infarct (HCC)    2004, when pt's stent clotted.  He got [facility] at that time.   Prediabetes    04/2019-->HbA1c 5.9% (unchanged compared to 2/3 yrs prior). 5.8% 11/2019  6.2% 05/2020. A1c 5.7% 11/2020.   Pseudophakia    RBBB     Past Surgical History:  Procedure Laterality Date   CARDIOVASCULAR STRESS TEST  2013   No ischemia.  EF 70%.   CATARACT EXTRACTION, BILATERAL     with lens implants   CERVICAL FUSION  2006   C5/6   CHOLECYSTECTOMY  2004   COLONOSCOPY  09/19/2010; 11/02/13; 04/28/18   2012: repeat 3 yrs,adenomas.  2015 same.  2019 same.  2022 same->Recall 3-5 yrs.   CORONARY ARTERY BYPASS GRAFT  2004   CABGx4   CORONARY STENT PLACEMENT     BMS in 1990s.    POLYPECTOMY     PROSTATE BIOPSY     03/04/23 benign   RETINAL DETACHMENT SURGERY N/A    2011 & 2013 both eyes affected   TONSILLECTOMY  1956    Outpatient Medications Prior to Visit  Medication Sig Dispense Refill   aspirin EC 81 MG tablet Take 81 mg by mouth daily.     atorvastatin (LIPITOR) 40 MG tablet Take 1 tablet (40 mg total) by mouth daily. 90 tablet 3   Cholecalciferol (VITAMIN D PO) Take 1 capsule by mouth daily.     Coenzyme Q10 (COQ10 PO) Take 300 mg by mouth daily.     finasteride (PROSCAR) 5 MG tablet Take 5 mg by mouth daily.     tamsulosin (FLOMAX) 0.4 MG CAPS capsule Take 0.4 mg by mouth daily.     triamcinolone cream (KENALOG) 0.1 % Apply 1 Application topically 2 (two) times daily.     No facility-administered medications prior to visit.    Allergies  Allergen Reactions   Penicillins Anaphylaxis    Review of Systems  As per HPI  PE:    09/01/2023  10:44 AM 06/28/2023    8:27 AM 06/07/2023    3:40 PM  Vitals with BMI  Height 6\' 0"   6\' 0"   Weight 191 lbs 10 oz 191 lbs 194 lbs 13 oz  BMI 25.98 25.9 26.41  Systolic 107 109 161  Diastolic 71 70 72  Pulse 72 69 81     Physical Exam  Gen: Alert, well appearing.  Patient is oriented to person, place, time, and situation. Abdomen is soft, nondistended, nontender. Genitourinary exam: Small focal lobular subcu mass can be felt on the left groin approximately the level of the inguinal ligament.  Nontender.  There is no overlying skin bruising. The area is palpable with him standing but when he lays supine it is no longer palpable. Penis and scrotum normal.  Testicles normal.  LABS:  Last metabolic panel Lab Results  Component Value Date   GLUCOSE 107 (H) 06/28/2023   NA 140 06/28/2023   K 4.5 06/28/2023   CL 103 06/28/2023   CO2 29 06/28/2023   BUN 15 06/28/2023   CREATININE 1.04 06/28/2023   GFR 71.31 06/28/2023   CALCIUM 9.3 06/28/2023   PROT 6.4 06/28/2023   ALBUMIN 4.3 06/28/2023   BILITOT  0.8 06/28/2023   ALKPHOS 81 06/28/2023   AST 19 06/28/2023   ALT 21 06/28/2023   IMPRESSION AND PLAN:  Left inguinal hernia (vs small hematoma?). Recent mild compression/trauma to the area.  This is getting better.  No sign of incarceration. Reassured. Continue to observe for complete clinical resolution to his symptoms. Discussed signs or symptoms to return for, call for, or go to the emergency department for.  An After Visit Summary was printed and given to the patient.  FOLLOW UP: Return if symptoms worsen or fail to improve.  Signed:  Santiago Bumpers, MD           09/01/2023

## 2023-09-06 LAB — PSA: PSA: 24.2

## 2023-10-05 ENCOUNTER — Encounter: Payer: Self-pay | Admitting: Family Medicine

## 2023-10-07 ENCOUNTER — Encounter: Payer: Self-pay | Admitting: Family Medicine

## 2023-10-07 ENCOUNTER — Ambulatory Visit: Admitting: Family Medicine

## 2023-10-07 VITALS — BP 104/63 | HR 73 | Temp 98.1°F | Ht 72.0 in | Wt 197.0 lb

## 2023-10-07 DIAGNOSIS — L84 Corns and callosities: Secondary | ICD-10-CM | POA: Diagnosis not present

## 2023-10-07 DIAGNOSIS — B070 Plantar wart: Secondary | ICD-10-CM | POA: Diagnosis not present

## 2023-10-07 NOTE — Progress Notes (Signed)
 OFFICE VISIT  10/07/2023  CC:  Chief Complaint  Patient presents with   Foot Concern    Callus on right foot; has been there for 4-5 months    Patient is a 73 y.o. male who presents for callus on right foot.  HPI: Area of thickened skin located over the right fifth metatarsal head region, plantar surface.  Gradually enlarging, and becoming more painful to walk on.  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   Cataract    Bilat: has lens implants bilat.   COVID-19 virus infection 04/05/2021   Depression    Elevated PSA    prostate bx benign 02/2023   Family history of colon cancer    Brother: in his 30s.   History of fusion of cervical spine    History of left lateral epicondylitis    History of retinal detachment    OU   Hyperlipidemia    Inguinal hernia, left    2025   Migraines    Myocardial infarct (HCC)    2004, when pt's stent clotted.  He got CABG at [facility] at that time.   Prediabetes    04/2019-->HbA1c 5.9% (unchanged compared to 2/3 yrs prior). 5.8% 11/2019  6.2% 05/2020. A1c 5.7% 11/2020.   Pseudophakia    RBBB     Past Surgical History:  Procedure Laterality Date   CARDIOVASCULAR STRESS TEST  2013   No ischemia.  EF 70%.   CATARACT EXTRACTION, BILATERAL     with lens implants   CERVICAL FUSION  2006   C5/6   CHOLECYSTECTOMY  2004   COLONOSCOPY  09/19/2010; 11/02/13; 04/28/18   2012: repeat 3 yrs,adenomas.  2015 same.  2019 same.  2022 same->Recall 3-5 yrs.   CORONARY ARTERY BYPASS GRAFT  2004   CABGx4   CORONARY STENT PLACEMENT     BMS in 1990s.   HEMORRHOID SURGERY     2024   POLYPECTOMY     PROSTATE BIOPSY     03/04/23 benign   RETINAL DETACHMENT SURGERY N/A    2011 & 2013 both eyes affected   TONSILLECTOMY   1956    Outpatient Medications Prior to Visit  Medication Sig Dispense Refill   aspirin EC 81 MG tablet Take 81 mg by mouth daily.     atorvastatin (LIPITOR) 40 MG tablet Take 1 tablet (40 mg total) by mouth daily. 90 tablet 3   Cholecalciferol (VITAMIN D PO) Take 1 capsule by mouth daily.     Coenzyme Q10 (COQ10 PO) Take 300 mg by mouth daily.     finasteride (PROSCAR) 5 MG tablet Take 5 mg by mouth daily.     tamsulosin (FLOMAX) 0.4 MG CAPS capsule Take 0.4 mg by mouth daily.     triamcinolone cream (KENALOG) 0.1 % Apply 1 Application topically 2 (two) times daily.     No facility-administered medications prior to visit.    Allergies  Allergen Reactions   Penicillins Anaphylaxis    Review of Systems  As per HPI  PE:    10/07/2023   10:54 AM 09/01/2023   10:44 AM 06/28/2023    8:27 AM  Vitals with BMI  Height 6\' 0"  6\' 0"    Weight  197 lbs 191 lbs 10 oz 191 lbs  BMI 26.71 25.98 25.9  Systolic 104 107 147  Diastolic 63 71 70  Pulse 73 72 69     Physical Exam  Gen: Alert, well appearing.  Patient is oriented to person, place, time, and situation. Plantar aspect of fifth metatarsal head with a callus with 2 punctate plantar warts visible beneath the surface.  No erythema or fluctuance.  LABS:  Last metabolic panel Lab Results  Component Value Date   GLUCOSE 107 (H) 06/28/2023   NA 140 06/28/2023   K 4.5 06/28/2023   CL 103 06/28/2023   CO2 29 06/28/2023   BUN 15 06/28/2023   CREATININE 1.04 06/28/2023   GFR 71.31 06/28/2023   CALCIUM 9.3 06/28/2023   PROT 6.4 06/28/2023   ALBUMIN 4.3 06/28/2023   BILITOT 0.8 06/28/2023   ALKPHOS 81 06/28/2023   AST 19 06/28/2023   ALT 21 06/28/2023   Last hemoglobin A1c Lab Results  Component Value Date   HGBA1C 5.7 (A) 06/28/2023   HGBA1C 5.7 06/28/2023   HGBA1C 5.7 06/28/2023   HGBA1C 5.7 06/28/2023   IMPRESSION AND PLAN:  Plantar callus with plantar wart x 2. Procedure: Shave plantar callus and warts. Callus  shape,--> round.  Size of callus-> 3 cm diameter. Area prepped with Betadine and alcohol. Used dermablade to shave approximately 1 mm of hyperkeratotic epithelium. Patient tolerated procedure well.  No immediate complications. Wound care discussed.  An After Visit Summary was printed and given to the patient.  FOLLOW UP: No follow-ups on file.  Signed:  Santiago Bumpers, MD           10/07/2023

## 2023-10-21 ENCOUNTER — Encounter: Payer: Self-pay | Admitting: Family Medicine

## 2023-11-08 ENCOUNTER — Encounter: Payer: Self-pay | Admitting: Family Medicine

## 2023-11-10 ENCOUNTER — Ambulatory Visit (INDEPENDENT_AMBULATORY_CARE_PROVIDER_SITE_OTHER): Admitting: Family Medicine

## 2023-11-10 ENCOUNTER — Encounter: Payer: Self-pay | Admitting: Family Medicine

## 2023-11-10 VITALS — BP 100/64 | HR 64 | Temp 97.9°F | Ht 72.0 in | Wt 195.4 lb

## 2023-11-10 DIAGNOSIS — R1032 Left lower quadrant pain: Secondary | ICD-10-CM | POA: Diagnosis not present

## 2023-11-10 DIAGNOSIS — K4090 Unilateral inguinal hernia, without obstruction or gangrene, not specified as recurrent: Secondary | ICD-10-CM

## 2023-11-10 DIAGNOSIS — M2042 Other hammer toe(s) (acquired), left foot: Secondary | ICD-10-CM | POA: Diagnosis not present

## 2023-11-10 NOTE — Progress Notes (Signed)
 OFFICE VISIT  11/10/2023  CC:  Chief Complaint  Patient presents with   Abdominal Pain    Lower; located right above pubic bone. Unsure if related to BPH. Varies between pain and discomfort mainly discomfort.    Patient is a 73 y.o. male who presents for lower abdominal pain.  HPI: For the last couple of weeks he has felt a pain in the left groin/suprapubic region.  Occasionally fleeting pain in the left lower quadrant but nothing persistent.  Eating and drinking fine.  Bowel habits normal. No fevers.  No trouble emptying bladder.  He has a left foot hammertoe that has been gradually bothering him more more.  He requests referral to podiatry.  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   Cataract    Bilat: has lens implants bilat.   COVID-19 virus infection 04/05/2021   Depression    Elevated PSA    prostate bx benign 02/2023   Family history of colon cancer    Brother: in his 30s.   History of fusion of cervical spine    History of left lateral epicondylitis    History of retinal detachment    OU   Hyperlipidemia    Inguinal hernia, left    2025   Migraines    Myocardial infarct (HCC)    2004, when pt's stent clotted.  He got CABG at [facility] at that time.   Prediabetes    04/2019-->HbA1c 5.9% (unchanged compared to 2/3 yrs prior). 5.8% 11/2019  6.2% 05/2020. A1c 5.7% 11/2020.   Pseudophakia    RBBB     Past Surgical History:  Procedure Laterality Date   CARDIOVASCULAR STRESS TEST  2013   No ischemia.  EF 70%.   CATARACT EXTRACTION, BILATERAL     with lens implants   CERVICAL FUSION  2006   C5/6   CHOLECYSTECTOMY  2004   COLONOSCOPY  09/19/2010; 11/02/13; 04/28/18   2012: repeat 3 yrs,adenomas.  2015 same.  2019 same.  2022  same->Recall 3-5 yrs.   CORONARY ARTERY BYPASS GRAFT  2004   CABGx4   CORONARY STENT PLACEMENT     BMS in 1990s.   HEMORRHOID SURGERY     2024   POLYPECTOMY     PROSTATE BIOPSY     03/04/23 benign   RETINAL DETACHMENT SURGERY N/A    2011 & 2013 both eyes affected   TONSILLECTOMY  1956    Outpatient Medications Prior to Visit  Medication Sig Dispense Refill   aspirin EC 81 MG tablet Take 81 mg by mouth daily.     atorvastatin  (LIPITOR) 40 MG tablet Take 1 tablet (40 mg total) by mouth daily. 90 tablet 3   Cholecalciferol (VITAMIN D PO) Take 1 capsule by mouth daily.     Coenzyme Q10 (COQ10 PO) Take 300 mg by mouth daily.     finasteride (PROSCAR) 5 MG tablet Take 5 mg by mouth daily.     tamsulosin (FLOMAX) 0.4 MG CAPS capsule Take 0.4 mg by mouth daily.     triamcinolone  cream (KENALOG ) 0.1 % Apply 1 Application topically 2 (two) times daily.     No facility-administered medications prior to visit.    Allergies  Allergen Reactions  Penicillins Anaphylaxis    Review of Systems  As per HPI  PE:    11/10/2023   10:43 AM 10/07/2023   10:54 AM 09/01/2023   10:44 AM  Vitals with BMI  Height 6\' 0"  6\' 0"  6\' 0"   Weight 195 lbs 6 oz 197 lbs 191 lbs 10 oz  BMI 26.5 26.71 25.98  Systolic 100 104 161  Diastolic 64 63 71  Pulse 64 73 72     Physical Exam  General: Alert and well-appearing. Affect is pleasant, lucid thought and speech. Abdomen is soft and nondistended.  He has some focal tenderness in the left inguinal region, with a palpable soft tissue swelling that is reducible. The swelling resolves when he lays supine. Left foot: Second toe with mild fixed hammertoe deformity with no skin breakdown.  No erythema or tenderness to touch.  LABS:  Last CBC Lab Results  Component Value Date   WBC 6.0 12/21/2022   HGB 15.1 12/21/2022   HCT 44.7 12/21/2022   MCV 86.0 12/21/2022   MCH 29.3 06/19/2022   RDW 13.1 12/21/2022   PLT 228.0 12/21/2022   Last metabolic  panel Lab Results  Component Value Date   GLUCOSE 107 (H) 06/28/2023   NA 140 06/28/2023   K 4.5 06/28/2023   CL 103 06/28/2023   CO2 29 06/28/2023   BUN 15 06/28/2023   CREATININE 1.04 06/28/2023   GFR 71.31 06/28/2023   CALCIUM  9.3 06/28/2023   PROT 6.4 06/28/2023   ALBUMIN 4.3 06/28/2023   BILITOT 0.8 06/28/2023   ALKPHOS 81 06/28/2023   AST 19 06/28/2023   ALT 21 06/28/2023   IMPRESSION AND PLAN:  #1 reducible left inguinal hernia, symptomatic. Refer to general surgery. Signs/symptoms to call or return for were reviewed and pt expressed understanding.  2.  Left second toe hammertoe deformity, increasingly painful. Patient request referral to podiatry--> ordered today.  An After Visit Summary was printed and given to the patient.  FOLLOW UP: Return for as needed.  Signed:  Arletha Lady, MD           11/10/2023

## 2023-11-18 ENCOUNTER — Ambulatory Visit

## 2023-11-18 ENCOUNTER — Ambulatory Visit: Admitting: Podiatry

## 2023-11-18 ENCOUNTER — Encounter: Payer: Self-pay | Admitting: Podiatry

## 2023-11-18 ENCOUNTER — Telehealth: Payer: Self-pay

## 2023-11-18 ENCOUNTER — Other Ambulatory Visit: Payer: Self-pay

## 2023-11-18 DIAGNOSIS — M2042 Other hammer toe(s) (acquired), left foot: Secondary | ICD-10-CM

## 2023-11-18 DIAGNOSIS — M7732 Calcaneal spur, left foot: Secondary | ICD-10-CM | POA: Diagnosis not present

## 2023-11-18 DIAGNOSIS — M19072 Primary osteoarthritis, left ankle and foot: Secondary | ICD-10-CM | POA: Diagnosis not present

## 2023-11-18 NOTE — Telephone Encounter (Signed)
 Copied from CRM [id number]. Topic: General - Other >> Nov 18, 2023  2:01 PM Annelle Kiel wrote: Reason for CRM: [facility] [place] [facility] Thersia Flax called stating that patient has a appt for  Monday may 3;20pm  Sent as FYI noted.

## 2023-11-18 NOTE — Progress Notes (Signed)
 Subjective:  Patient ID: Gavin [name], male    DOB: 09-Mar-1951,   MRN: [id number]  Chief Complaint  Patient presents with   Hammer Toe    Pt presents for painful hammertoe second toe on the left foot states it's been like that for while, it's getting very uncomfortable and painful when he wear certain shoes or even when the bed sheets touches it, he has one on the right second toe but it is not as bad as the left.    73 y.o. male presents for concern of left second digit hammertoe and stiffness in his great toe. Does have similar changes on right but not as bad as left. Relates this has been ongoing for years. Was told in the past he would need surgery to fix it. Relates the second toe has been painful and bothersome. Has tried straightening himself.   . Denies any other pedal complaints. Denies n/v/f/c.   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   Cataract    Bilat: has lens implants bilat.   COVID-19 virus infection 04/05/2021   Depression    Elevated PSA    prostate bx benign 02/2023   Family history of colon cancer    Brother: in his 30s.   History of fusion of cervical spine    History of left lateral epicondylitis    History of retinal detachment    OU   Hyperlipidemia    Inguinal hernia, left    2025   Migraines    Myocardial infarct (HCC)    2004, when pt's stent clotted.  He got CABG at [facility] at that time.   Prediabetes    04/2019-->HbA1c 5.9% (unchanged compared to 2/3 yrs prior). 5.8% 11/2019  6.2% 05/2020. A1c 5.7% 11/2020.   Pseudophakia    RBBB     Objective:  Physical Exam: Vascular: DP/PT pulses 2/4 bilateral. CFT <3 seconds. Normal hair growth on digits. No edema.  Skin. No lacerations or abrasions bilateral feet.   Musculoskeletal: MMT 5/5 bilateral lower extremities in DF, PF, Inversion and Eversion. Deceased ROM in DF of ankle joint. Second digit hammertoe on left with flexibility noted and able to nearly completely reduce the deformity. Pain to distal second digit upon palpaiton. Limited Rom of the first MPJ but no pain associated.  Neurological: Sensation intact to light touch.   Assessment:   1. 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 -X-rays reviewed. Hammered second digit and some mild degenerative changes noted at first MPJ.  -Educated on hammertoes and treatment options  -Discussed padding including toe caps and crest pads.  -Discussed possible flexor tenotomy to straighten toe and patient would like to proceed with this. Procedure below.  -Discussed need for potential surgery if pain does not improved.  -Patient to follow-up in one week for recheck.  needed. Discussed calling if any changes or increased pain.   Procedure Percutaneous Flexor Tenotomy ([id number]): Procedure: Left second  digit flexor tenotomy  Surgeon: Jennefer Moats, DPM Pre-op Dx: Left second  digit hammertoe with digit ulceration Post-op: Same Place of Surgery: Office surgical suite  Indications for surgery: Flexible  hammertoe deformity.  Findings: Digit was able to be straightened after tenotomy preformed.   Today, the risks, benefits, and complications of this procedure were explained, and written consent was obtained. The patient was then placed in a semi-reclined position, an the foot was prepped and draped in the usual aseptic manner. Attention was directed to the offending toe. Local anesthetic of 3 ml of 1% lidocaine plain was administered. An 18 gauge needed was used in the plantar aspect of the digit and tenotomy was preformed. Straightening of the toe was noted. Hemostasis was obtained by use of compression. Area was copiously irrigated.  Digit was splinted with betadine gauze and dry dressing.   Written and oral post-op instructions were given to the patient for the following home care:  1)Remove dressing tomorrow  2) Do not soak the area  3) Cover toe with band-aid if needed 4)Notify office if toe fails to improve or if there is an increased in redness or swelling.     Jennefer Moats, DPM

## 2023-11-22 ENCOUNTER — Other Ambulatory Visit: Payer: Self-pay | Admitting: Surgery

## 2023-11-22 DIAGNOSIS — K4020 Bilateral inguinal hernia, without obstruction or gangrene, not specified as recurrent: Secondary | ICD-10-CM | POA: Diagnosis not present

## 2023-11-24 ENCOUNTER — Encounter (HOSPITAL_BASED_OUTPATIENT_CLINIC_OR_DEPARTMENT_OTHER): Payer: Self-pay | Admitting: Surgery

## 2023-11-24 ENCOUNTER — Other Ambulatory Visit: Payer: Self-pay

## 2023-11-24 MED ORDER — CHLORHEXIDINE GLUCONATE CLOTH 2 % EX PADS: 6.0000 | MEDICATED_PAD | Freq: Once | CUTANEOUS | Status: DC

## 2023-11-24 MED ORDER — ENSURE PRE-SURGERY PO LIQD: 296.0000 mL | Freq: Once | ORAL | Status: DC

## 2023-11-24 NOTE — Progress Notes (Signed)

## 2023-11-25 ENCOUNTER — Ambulatory Visit (INDEPENDENT_AMBULATORY_CARE_PROVIDER_SITE_OTHER): Admitting: Podiatry

## 2023-11-25 DIAGNOSIS — M2042 Other hammer toe(s) (acquired), left foot: Secondary | ICD-10-CM

## 2023-11-25 NOTE — Progress Notes (Signed)
  Subjective:  Patient ID: Gavin [name], male    DOB: [birth year],   MRN: [id number]  Chief Complaint  Patient presents with   Hammer Toe    Pt presents for a follow up  of left second digit hammertoe and stiffness in his great toe. States he is doing better.    73 y.o. male presents for follow-up of left second digit tenotomy.    . Denies any other pedal complaints. Denies n/v/f/c.   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   Cataract    Bilat: has lens implants bilat.   COVID-19 virus infection 04/05/2021   Elevated PSA    prostate bx benign 02/2023   Family history of colon cancer    Brother: in his 30s.   GERD (gastroesophageal reflux disease)    History of fusion of cervical spine    History of left lateral epicondylitis    History of retinal detachment    OU   Hyperlipidemia    Inguinal hernia, left    2025   Migraines    Myocardial infarct (HCC)    2004, when pt's stent clotted.  He got CABG at [facility] at that time.   Prediabetes    04/2019-->HbA1c 5.9% (unchanged compared to 2/3 yrs prior). 5.8% 11/2019  6.2% 05/2020. A1c 5.7% 11/2020.   Pseudophakia    RBBB     Objective:  Physical Exam: Vascular: DP/PT pulses 2/4 bilateral. CFT <3 seconds. Normal hair growth on digits. No edema.  Skin. No lacerations or abrasions bilateral feet.  Musculoskeletal: MMT 5/5 bilateral lower extremities in DF, PF, Inversion and Eversion. Deceased ROM in DF of ankle joint. Second digit hammertoe on left improved position and pain.  Limited Rom of the first MPJ but no pain associated.  Neurological: Sensation intact to light touch.   Assessment:   1. Hammertoe of left foot       Plan:  Patient was evaluated and treated and all  questions answered. -X-rays reviewed. Hammered second digit and some mild degenerative changes noted at first MPJ.  Toe was evaluated and appears to be healing well.  May discontinue bandaging.  Patient to follow-up as needed.        Jennefer Moats, DPM

## 2023-11-28 NOTE — H&P (Signed)
 REFERRING PHYSICIAN: Shelvia Dick, MD PROVIDER: Debi Fall, MD MRN: [id number] DOB: [birth year] DATE OF ENCOUNTER: 11/22/2023 Subjective    Chief Complaint: Left inguinal pain  History of Present Illness: Gavin [name] is a 73 y.o. male who is seen today as an office consultation for evaluation of a left inguinal hernia  This is a pleasant 73 year old gentleman who is referred here for a possible left inguinal hernia. For the last 3 months he has noticed pain in his left inguinal area going down to his scrotum. He has also noticed an intermittent bulge which she is able to reduce. He has had no obstructive symptoms and the pain is only mild to moderate. He has not noticed any issues at his umbilicus or right inguinal area. He has had a previous CABG in 2004 and is only on a baby aspirin and is otherwise been doing very well from a cardiopulmonary standpoint.  Review of Systems: A complete review of systems was obtained from the patient. I have reviewed this information and discussed as appropriate with the patient. See HPI as well for other ROS.  ROS   Medical History: Past Medical History:  Diagnosis Date  Arthritis  GERD (gastroesophageal reflux disease)  Hyperlipidemia   There is no problem list on file for this patient.  Past Surgical History:  Procedure Laterality Date  CHOLECYSTECTOMY  CORONARY ARTERY BYPASS GRAFT  TONSILLECTOMY    Allergies  Allergen Reactions  Penicillins Anaphylaxis   Current Outpatient Medications on File Prior to Visit  Medication Sig Dispense Refill  aspirin 81 MG EC tablet Take 81 mg by mouth once daily  atorvastatin  (LIPITOR) 40 MG tablet Take 40 mg by mouth once daily  finasteride (PROSCAR) 5 mg tablet Take 5 mg by mouth once daily  tamsulosin (FLOMAX) 0.4 mg capsule Take 0.4 mg by mouth once daily   No current facility-administered medications on file prior to visit.   Family History  Problem Relation Age of Onset   High blood pressure (Hypertension) Father  Hyperlipidemia (Elevated cholesterol) Father  Coronary Artery Disease (Blocked arteries around heart) Father  High blood pressure (Hypertension) Brother  Hyperlipidemia (Elevated cholesterol) Brother  Coronary Artery Disease (Blocked arteries around heart) Brother  Colon cancer Brother    Social History   Tobacco Use  Smoking Status Never  Smokeless Tobacco Never    Social History   Socioeconomic History  Marital status: Married  Tobacco Use  Smoking status: Never  Smokeless tobacco: Never  Vaping Use  Vaping status: Never Used  Substance and Sexual Activity  Alcohol use: Not Currently  Alcohol/week: 1.0 - 2.0 standard drink of alcohol  Types: 1 - 2 Standard drinks or equivalent per week  Drug use: Never   Social Drivers of Corporate investment banker Strain: Low Risk (07/07/2023)  Received from [facility]  Overall Financial Resource Strain (CARDIA)  Difficulty of Paying Living Expenses: Not hard at all  Food Insecurity: No Food Insecurity (07/07/2023)  Received from [facility]  Hunger Vital Sign  Worried About Running Out of Food in the Last Year: Never true  Ran Out of Food in the Last Year: Never true  Transportation Needs: No Transportation Needs (07/07/2023)  Received from [facility] - Transportation  Lack of Transportation (Medical): No  Lack of Transportation (Non-Medical): No  Physical Activity: Sufficiently Active (07/07/2023)  Received from [facility]  Exercise Vital Sign  Days of Exercise per Week: 4 days  Minutes of Exercise per Session: 40  min  Stress: No Stress Concern Present (07/07/2023)  Received from [facility] - Occupational Stress Questionnaire  Feeling of Stress : Not at all  Social Connections: Moderately Isolated (07/07/2023)  Received from [facility]  Social Connection and Isolation Panel [NHANES]  Frequency of Communication with  Friends and Family: Three times a week  Frequency of Social Gatherings with Friends and Family: Once a week  Attends Religious Services: Never  Database administrator or Organizations: No  Attends Banker Meetings: Never  Marital Status: Married  Housing Stability: Unknown (11/22/2023)  Housing Stability Vital Sign  Homeless in the Last Year: No   Objective:   Vitals:  11/22/23 1518 11/22/23 1519  BP: 124/77  Pulse: 69  Temp: 36.4 C (97.6 F)  SpO2: 98%  Weight: 90 kg (198 lb 6.4 oz)  Height: 185.4 cm (6\' 1" )  PainSc: 0-No pain   Body mass index is 26.18 kg/m.  Physical Exam   He appears well on physical examination  He does have an easily reducible left inguinal hernia but also has a much smaller, reducible right inguinal hernia as well. There is no evidence of umbilical hernia  Labs, Imaging and Diagnostic Testing: I have reviewed his notes in the electronic medical records  Assessment and Plan:   Diagnoses and all orders for this visit:  Non-recurrent bilateral inguinal hernia without obstruction or gangrene   I discussed abdominal anatomy and hernias with the patient. We discussed conservative management versus surgical repair. He is definitely interested in surgery given his symptoms. I next discussed both the laparoscopic and open techniques for repair of inguinal hernias. As his is bilateral, laparoscopic repair is recommended. I explained the surgical procedure in detail. We discussed the risks which includes but is not limited to bleeding, infection, injury to surrounding structures, the need to convert to an open procedure, use of mesh, nerve entrapment, chronic pain, hernia recurrence, cardiopulmonary issues with anesthesia, DVT, postoperative recovery, etc. After thorough discussion, he understands and wished to proceed with a bilateral laparoscopic inguinal hernia repair with mesh. Surgery will be scheduled

## 2023-11-29 ENCOUNTER — Other Ambulatory Visit: Payer: Self-pay

## 2023-11-29 ENCOUNTER — Ambulatory Visit (HOSPITAL_BASED_OUTPATIENT_CLINIC_OR_DEPARTMENT_OTHER): Admitting: Anesthesiology

## 2023-11-29 ENCOUNTER — Ambulatory Visit (HOSPITAL_BASED_OUTPATIENT_CLINIC_OR_DEPARTMENT_OTHER)
Admission: RE | Admit: 2023-11-29 | Discharge: 2023-11-29 | Disposition: A | Discharge status: Home / Self Care | Attending: Surgery | Admitting: Surgery

## 2023-11-29 ENCOUNTER — Encounter (HOSPITAL_BASED_OUTPATIENT_CLINIC_OR_DEPARTMENT_OTHER): Payer: Self-pay | Admitting: Surgery

## 2023-11-29 ENCOUNTER — Encounter (HOSPITAL_BASED_OUTPATIENT_CLINIC_OR_DEPARTMENT_OTHER)
Admission: RE | Disposition: A | Discharge status: Home / Self Care | Payer: Self-pay | Source: Home / Self Care | Attending: Surgery

## 2023-11-29 DIAGNOSIS — I2510 Atherosclerotic heart disease of native coronary artery without angina pectoris: Secondary | ICD-10-CM

## 2023-11-29 DIAGNOSIS — Z7982 Long term (current) use of aspirin: Secondary | ICD-10-CM | POA: Insufficient documentation

## 2023-11-29 DIAGNOSIS — I252 Old myocardial infarction: Secondary | ICD-10-CM | POA: Diagnosis not present

## 2023-11-29 DIAGNOSIS — Z79899 Other long term (current) drug therapy: Secondary | ICD-10-CM | POA: Diagnosis not present

## 2023-11-29 DIAGNOSIS — K4020 Bilateral inguinal hernia, without obstruction or gangrene, not specified as recurrent: Secondary | ICD-10-CM

## 2023-11-29 DIAGNOSIS — R519 Headache, unspecified: Secondary | ICD-10-CM | POA: Diagnosis not present

## 2023-11-29 DIAGNOSIS — K219 Gastro-esophageal reflux disease without esophagitis: Secondary | ICD-10-CM | POA: Insufficient documentation

## 2023-11-29 DIAGNOSIS — E785 Hyperlipidemia, unspecified: Secondary | ICD-10-CM | POA: Diagnosis not present

## 2023-11-29 DIAGNOSIS — M1990 Unspecified osteoarthritis, unspecified site: Secondary | ICD-10-CM | POA: Insufficient documentation

## 2023-11-29 DIAGNOSIS — Z951 Presence of aortocoronary bypass graft: Secondary | ICD-10-CM | POA: Diagnosis not present

## 2023-11-29 DIAGNOSIS — Z01818 Encounter for other preprocedural examination: Secondary | ICD-10-CM

## 2023-11-29 HISTORY — PX: INGUINAL HERNIA REPAIR: SHX194

## 2023-11-29 HISTORY — DX: Gastro-esophageal reflux disease without esophagitis: K21.9

## 2023-11-29 SURGERY — REPAIR, HERNIA, INGUINAL, LAPAROSCOPIC
Anesthesia: General | Site: Abdomen | Laterality: Bilateral

## 2023-11-29 MED ORDER — PROPOFOL 10 MG/ML IV BOLUS
INTRAVENOUS | Status: DC | PRN
  Administered 2023-11-29: 130 mg via INTRAVENOUS

## 2023-11-29 MED ORDER — PHENYLEPHRINE HCL (PRESSORS) 10 MG/ML IV SOLN
INTRAVENOUS | Status: DC | PRN
  Administered 2023-11-29: 80 ug via INTRAVENOUS

## 2023-11-29 MED ORDER — ACETAMINOPHEN 500 MG PO TABS
ORAL_TABLET | ORAL | Status: AC
  Filled 2023-11-29: qty 2

## 2023-11-29 MED ORDER — ONDANSETRON HCL 4 MG/2ML IJ SOLN
INTRAMUSCULAR | Status: DC | PRN
  Administered 2023-11-29: 4 mg via INTRAVENOUS

## 2023-11-29 MED ORDER — EPHEDRINE SULFATE (PRESSORS) 50 MG/ML IJ SOLN
INTRAMUSCULAR | Status: DC | PRN
  Administered 2023-11-29 (×2): 5 mg via INTRAVENOUS

## 2023-11-29 MED ORDER — ONDANSETRON HCL 4 MG/2ML IJ SOLN
INTRAMUSCULAR | Status: AC
  Filled 2023-11-29: qty 2

## 2023-11-29 MED ORDER — DEXAMETHASONE SODIUM PHOSPHATE 10 MG/ML IJ SOLN
INTRAMUSCULAR | Status: DC | PRN
  Administered 2023-11-29: 5 mg via INTRAVENOUS

## 2023-11-29 MED ORDER — LIDOCAINE 2% (20 MG/ML) 5 ML SYRINGE
INTRAMUSCULAR | Status: DC | PRN
  Administered 2023-11-29: 40 mg via INTRAVENOUS

## 2023-11-29 MED ORDER — MIDAZOLAM HCL 2 MG/2ML IJ SOLN
INTRAMUSCULAR | Status: AC
  Filled 2023-11-29: qty 2

## 2023-11-29 MED ORDER — PROPOFOL 10 MG/ML IV BOLUS
INTRAVENOUS | Status: AC
  Filled 2023-11-29: qty 20

## 2023-11-29 MED ORDER — CIPROFLOXACIN IN D5W 400 MG/200ML IV SOLN
INTRAVENOUS | Status: AC
  Filled 2023-11-29: qty 200

## 2023-11-29 MED ORDER — FENTANYL CITRATE (PF) 100 MCG/2ML IJ SOLN
25.0000 ug | INTRAMUSCULAR | Status: DC | PRN
  Administered 2023-11-29 (×2): 25 ug via INTRAVENOUS

## 2023-11-29 MED ORDER — ROCURONIUM BROMIDE 100 MG/10ML IV SOLN
INTRAVENOUS | Status: DC | PRN
  Administered 2023-11-29: 50 mg via INTRAVENOUS

## 2023-11-29 MED ORDER — LACTATED RINGERS IV SOLN: INTRAVENOUS | Status: DC | PRN

## 2023-11-29 MED ORDER — MIDAZOLAM HCL 5 MG/5ML IJ SOLN
INTRAMUSCULAR | Status: DC | PRN
  Administered 2023-11-29: 1 mg via INTRAVENOUS

## 2023-11-29 MED ORDER — FENTANYL CITRATE (PF) 100 MCG/2ML IJ SOLN
INTRAMUSCULAR | Status: DC | PRN
  Administered 2023-11-29: 100 ug via INTRAVENOUS

## 2023-11-29 MED ORDER — CIPROFLOXACIN IN D5W 400 MG/200ML IV SOLN
400.0000 mg | INTRAVENOUS | Status: AC
Start: 2023-11-29 — End: 2023-11-29
  Administered 2023-11-29: 400 mg via INTRAVENOUS

## 2023-11-29 MED ORDER — ROCURONIUM BROMIDE 10 MG/ML (PF) SYRINGE
PREFILLED_SYRINGE | INTRAVENOUS | Status: AC
  Filled 2023-11-29: qty 10

## 2023-11-29 MED ORDER — LIDOCAINE 2% (20 MG/ML) 5 ML SYRINGE
INTRAMUSCULAR | Status: AC
  Filled 2023-11-29: qty 5

## 2023-11-29 MED ORDER — FENTANYL CITRATE (PF) 100 MCG/2ML IJ SOLN
INTRAMUSCULAR | Status: AC
  Filled 2023-11-29: qty 2

## 2023-11-29 MED ORDER — BUPIVACAINE-EPINEPHRINE (PF) 0.5% -1:200000 IJ SOLN
INTRAMUSCULAR | Status: AC
  Filled 2023-11-29: qty 30

## 2023-11-29 MED ORDER — DROPERIDOL 2.5 MG/ML IJ SOLN: 0.6250 mg | Freq: Once | INTRAMUSCULAR | Status: DC | PRN

## 2023-11-29 MED ORDER — DEXAMETHASONE SODIUM PHOSPHATE 10 MG/ML IJ SOLN
INTRAMUSCULAR | Status: AC
  Filled 2023-11-29: qty 1

## 2023-11-29 MED ORDER — BUPIVACAINE-EPINEPHRINE (PF) 0.5% -1:200000 IJ SOLN
INTRAMUSCULAR | Status: DC | PRN
  Administered 2023-11-29: 20 mL

## 2023-11-29 MED ORDER — SUGAMMADEX SODIUM 200 MG/2ML IV SOLN
INTRAVENOUS | Status: DC | PRN
  Administered 2023-11-29: 200 mg via INTRAVENOUS

## 2023-11-29 MED ORDER — EPHEDRINE 5 MG/ML INJ
INTRAVENOUS | Status: AC
  Filled 2023-11-29: qty 5

## 2023-11-29 MED ORDER — TRAMADOL HCL 50 MG PO TABS: 50.0000 mg | ORAL_TABLET | Freq: Four times a day (QID) | ORAL | 0 refills | Status: DC | PRN

## 2023-11-29 MED ORDER — ACETAMINOPHEN 10 MG/ML IV SOLN: 1000.0000 mg | Freq: Once | INTRAVENOUS | Status: DC | PRN

## 2023-11-29 MED ORDER — OXYCODONE HCL 5 MG PO TABS: 5.0000 mg | ORAL_TABLET | Freq: Once | ORAL | Status: DC | PRN

## 2023-11-29 MED ORDER — OXYCODONE HCL 5 MG/5ML PO SOLN: 5.0000 mg | Freq: Once | ORAL | Status: DC | PRN

## 2023-11-29 MED ORDER — ACETAMINOPHEN 500 MG PO TABS
1000.0000 mg | ORAL_TABLET | ORAL | Status: AC
  Administered 2023-11-29: 1000 mg via ORAL

## 2023-11-29 SURGICAL SUPPLY — 24 items
BLADE CLIPPER SURG (BLADE) IMPLANT
CHLORAPREP W/TINT 26 (MISCELLANEOUS) ×2 IMPLANT
CLIP APPLIE 5 13 M/L LIGAMAX5 (MISCELLANEOUS) IMPLANT
DERMABOND ADVANCED .7 DNX12 (GAUZE/BANDAGES/DRESSINGS) ×4 IMPLANT
DEVICE SECURE STRAP 25 ABSORB (INSTRUMENTS) ×2 IMPLANT
DISSECTOR BALLN SPACEMKR + OVL (BALLOONS) ×2 IMPLANT
DISSECTOR BLUNT TIP ENDO 5MM (MISCELLANEOUS) IMPLANT
ELECTRODE REM PT RTRN 9FT ADLT (ELECTROSURGICAL) ×2 IMPLANT
GAUZE 4X4 16PLY ~~LOC~~+RFID DBL (SPONGE) ×2 IMPLANT
GLOVE SURG SIGNA 7.5 PF LTX (GLOVE) ×2 IMPLANT
GOWN STRL REUS W/ TWL LRG LVL3 (GOWN DISPOSABLE) ×2 IMPLANT
GOWN STRL REUS W/ TWL XL LVL3 (GOWN DISPOSABLE) ×2 IMPLANT
IRRIGATION SUCT STRKRFLW 2 WTP (MISCELLANEOUS) IMPLANT
MESH 3DMAX 4X6 LT LRG (Mesh General) IMPLANT
MESH 3DMAX 4X6 RT LRG (Mesh General) IMPLANT
PACK BASIN DAY SURGERY FS (CUSTOM PROCEDURE TRAY) ×2 IMPLANT
PENCIL SMOKE EVACUATOR (MISCELLANEOUS) IMPLANT
SCISSORS LAP 5X35 DISP (ENDOMECHANICALS) IMPLANT
SET TROCAR LAP APPLE-HUNT 5MM (ENDOMECHANICALS) ×2 IMPLANT
SET TUBE SMOKE EVAC HIGH FLOW (TUBING) ×2 IMPLANT
SLEEVE SCD COMPRESS KNEE MED (STOCKING) ×2 IMPLANT
SUT MNCRL AB 4-0 PS2 18 (SUTURE) ×2 IMPLANT
TOWEL GREEN STERILE FF (TOWEL DISPOSABLE) ×2 IMPLANT
TRAY LAPAROSCOPIC (CUSTOM PROCEDURE TRAY) ×2 IMPLANT

## 2023-11-29 NOTE — Transfer of Care (Signed)
 Immediate Anesthesia Transfer of Care Note  Patient: Gavin [name]  Procedure(s) Performed: REPAIR, HERNIA, INGUINAL, LAPAROSCOPIC (Bilateral: Abdomen)  Patient Location: PACU  Anesthesia Type:General  Level of Consciousness: drowsy  Airway & Oxygen Therapy: Patient Spontanous Breathing and Patient connected to face mask oxygen  Post-op Assessment: Report given to RN and Post -op Vital signs reviewed and stable  Post vital signs: Reviewed and stable  Last Vitals:  Vitals Value Taken Time  BP 109/67 11/29/23 1115  Temp    Pulse 86 11/29/23 1117  Resp 20 11/29/23 1117  SpO2 100 % 11/29/23 1117  Vitals shown include unfiled device data.  Last Pain:  Vitals:   11/29/23 0816  TempSrc: Temporal  PainSc: 0-No pain      Patients Stated Pain Goal: 3 (11/29/23 0816)  Complications: No notable events documented.

## 2023-11-29 NOTE — Discharge Instructions (Addendum)
 [facility] _______[facility]  UMBILICAL OR INGUINAL HERNIA REPAIR: POST OP INSTRUCTIONS  Always review your discharge instruction sheet given to you by the facility where your surgery was performed. IF YOU HAVE DISABILITY OR FAMILY LEAVE FORMS, YOU MUST BRING THEM TO THE OFFICE FOR PROCESSING.   DO NOT GIVE THEM TO YOUR DOCTOR.  1. A  prescription for pain medication may be given to you upon discharge.  Take your pain medication as prescribed, if needed.  If narcotic pain medicine is not needed, then you may take acetaminophen (Tylenol) or ibuprofen (Advil) as needed. 2. Take your usually prescribed medications unless otherwise directed. If you need a refill on your pain medication, please contact your pharmacy.  They will contact our office to request authorization. Prescriptions will not be filled after 5 pm or on week-ends. 3. You should follow a light diet the first 24 hours after arrival home, such as soup and crackers, etc.  Be sure to include lots of fluids daily.  Resume your normal diet the day after surgery. 4.Most patients will experience some swelling and bruising around the umbilicus or in the groin and scrotum.  Ice packs and reclining will help.  Swelling and bruising can take several days to resolve.  6. It is common to experience some constipation if taking pain medication after surgery.  Increasing fluid intake and taking a stool softener (such as Colace) will usually help or prevent this problem from occurring.  A mild laxative (Milk of Magnesia or Miralax) should be taken according to package directions if there are no bowel movements after 48 hours. 7. Unless discharge instructions indicate otherwise, you may remove your bandages 24-48 hours after surgery, and you may shower at that time.  You may have steri-strips (small skin tapes) in place directly over the incision.  These strips should be left on the skin for 7-10 days.  If your surgeon used skin glue on the  incision, you may shower in 24 hours.  The glue will flake off over the next 2-3 weeks.  Any sutures or staples will be removed at the office during your follow-up visit. 8. ACTIVITIES:  You may resume regular (light) daily activities beginning the next day--such as daily self-care, walking, climbing stairs--gradually increasing activities as tolerated.  You may have sexual intercourse when it is comfortable.  Refrain from any heavy lifting or straining until approved by your doctor.  a.You may drive when you are no longer taking prescription pain medication, you can comfortably wear a seatbelt, and you can safely maneuver your car and apply brakes. b.RETURN TO WORK:   _____________________________________________  9.You should see your doctor in the office for a follow-up appointment approximately 2-3 weeks after your surgery.  Make sure that you call for this appointment within a day or two after you arrive home to insure a convenient appointment time. 10.OTHER INSTRUCTIONS: YOU MAY SHOWER STARTING TOMORROW ICE PACK, TYLENOL, AND IBUPROFEN ALSO FOR PAIN NO LIFTING MORE THAN 15 POUNDS FOR 4 WEEKS    _____________________________________  WHEN TO CALL YOUR DOCTOR: Fever over 101.0 Inability to urinate Nausea and/or vomiting Extreme swelling or bruising Continued bleeding from incision. Increased pain, redness, or drainage from the incision  The clinic staff is available to answer your questions during regular business hours.  Please don't hesitate to call and ask to speak to one of the nurses for clinical concerns.  If you have a medical emergency, go to the nearest emergency room or call 911.  A surgeon from [facility] is always on call at the hospital   [address], [place], [place]  [address] ?  [address] [address], [place], [place]   [address] [phone] ? [phone] ? FAX [phone] Web site: [email or web address]   Post Anesthesia Home  Care Instructions  Activity: Get plenty of rest for the remainder of the day. A responsible individual must stay with you for 24 hours following the procedure.  For the next 24 hours, DO NOT: -Drive a car -Advertising copywriter -Drink alcoholic beverages -Take any medication unless instructed by your physician -Make any legal decisions or sign important papers.  Meals: Start with liquid foods such as gelatin or soup. Progress to regular foods as tolerated. Avoid greasy, spicy, heavy foods. If nausea and/or vomiting occur, drink only clear liquids until the nausea and/or vomiting subsides. Call your physician if vomiting continues.  Special Instructions/Symptoms: Your throat may feel dry or sore from the anesthesia or the breathing tube placed in your throat during surgery. If this causes discomfort, gargle with warm salt water. The discomfort should disappear within 24 hours.  If you had a scopolamine patch placed behind your ear for the management of post- operative nausea and/or vomiting:  1. The medication in the patch is effective for 72 hours, after which it should be removed.  Wrap patch in a tissue and discard in the trash. Wash hands thoroughly with soap and water. 2. You may remove the patch earlier than 72 hours if you experience unpleasant side effects which may include dry mouth, dizziness or visual disturbances. 3. Avoid touching the patch. Wash your hands with soap and water after contact with the patch.    No tylenol until after 1:30 if needed

## 2023-11-29 NOTE — Anesthesia Postprocedure Evaluation (Signed)
 Anesthesia Post Note  Patient: Gavin [name]  Procedure(s) Performed: REPAIR, HERNIA, INGUINAL, LAPAROSCOPIC (Bilateral: Abdomen)     Patient location during evaluation: PACU Anesthesia Type: General Level of consciousness: awake and alert Pain management: pain level controlled Vital Signs Assessment: post-procedure vital signs reviewed and stable Respiratory status: spontaneous breathing, nonlabored ventilation, respiratory function stable and patient connected to nasal cannula oxygen Cardiovascular status: blood pressure returned to baseline and stable Postop Assessment: no apparent nausea or vomiting Anesthetic complications: no  No notable events documented.  Last Vitals:  Vitals:   11/29/23 1157 11/29/23 1200  BP:  105/70  Pulse: 64 65  Resp: 17 19  Temp:    SpO2: 98% 99%    Last Pain:  Vitals:   11/29/23 1200  TempSrc:   PainSc: 5                  Willian Harrow

## 2023-11-29 NOTE — Interval H&P Note (Signed)
 History and Physical Interval Note:no change in H and P  11/29/2023 8:35 AM  Gavin [name]  has presented today for surgery, with the diagnosis of BILATERAL INGUINAL HERNIAS.  The various methods of treatment have been discussed with the patient and family. After consideration of risks, benefits and other options for treatment, the patient has consented to  Procedure(s) with comments: REPAIR, HERNIA, INGUINAL, LAPAROSCOPIC (Bilateral) - LAPAROSCOPIC BILATERAL INGUINAL HERNIA REPAIR WITH MESH as a surgical intervention.  The patient's history has been reviewed, patient examined, no change in status, stable for surgery.  I have reviewed the patient's chart and labs.  Questions were answered to the patient's satisfaction.     Oza Blumenthal

## 2023-11-29 NOTE — Anesthesia Procedure Notes (Signed)
 Procedure Name: Intubation Date/Time: 11/29/2023 10:13 AM  Performed by: Noralyn Beams, CRNAPre-anesthesia Checklist: Patient identified, Emergency Drugs available, Suction available and Patient being monitored Patient Re-evaluated:Patient Re-evaluated prior to induction Oxygen Delivery Method: Circle system utilized Preoxygenation: Pre-oxygenation with 100% oxygen Induction Type: IV induction Ventilation: Mask ventilation without difficulty and Oral airway inserted - appropriate to patient size Laryngoscope Size: Mac and 4 Grade View: Grade II Tube type: Oral Tube size: 7.5 mm Number of attempts: 1 Airway Equipment and Method: Stylet, Oral airway and Bite block Placement Confirmation: ETT inserted through vocal cords under direct vision, positive ETCO2 and breath sounds checked- equal and bilateral Secured at: 24 cm Tube secured with: Tape Dental Injury: Teeth and Oropharynx as per pre-operative assessment

## 2023-11-29 NOTE — Anesthesia Preprocedure Evaluation (Addendum)
 Anesthesia Evaluation  Patient identified by MRN, date of birth, ID band Patient awake    Reviewed: Allergy & Precautions, NPO status , Patient's Chart, lab work & pertinent test results  Airway Mallampati: I  TM Distance: >3 FB Neck ROM: Full    Dental  (+) Teeth Intact, Dental Advisory Given   Pulmonary neg pulmonary ROS   breath sounds clear to auscultation       Cardiovascular + CAD, + Past MI and + CABG   Rhythm:Regular Rate:Normal     Neuro/Psych  Headaches    GI/Hepatic Neg liver ROS,GERD  ,,  Endo/Other  negative endocrine ROS    Renal/GU negative Renal ROS     Musculoskeletal  (+) Arthritis ,    Abdominal   Peds  Hematology negative hematology ROS (+)   Anesthesia Other Findings   Reproductive/Obstetrics                             Anesthesia Physical Anesthesia Plan  ASA: 3  Anesthesia Plan: General   Post-op Pain Management: Tylenol PO (pre-op)*   Induction: Intravenous  PONV Risk Score and Plan: 3 and Ondansetron, Dexamethasone and Midazolam  Airway Management Planned: Oral ETT  Additional Equipment: None  Intra-op Plan:   Post-operative Plan: Extubation in OR  Informed Consent: I have reviewed the patients History and Physical, chart, labs and discussed the procedure including the risks, benefits and alternatives for the proposed anesthesia with the patient or authorized representative who has indicated his/her understanding and acceptance.     Dental advisory given  Plan Discussed with: CRNA  Anesthesia Plan Comments:        Anesthesia Quick Evaluation

## 2023-11-29 NOTE — Op Note (Signed)
 Gavin [name] 11/29/2023   Pre-op Diagnosis: BILATERAL INGUINAL HERNIAS     Post-op Diagnosis: same  Procedure(s): LAPAROSCOPIC BILATERAL INGUINAL HERNIA REPAIR WITH MESH  Surgeon(s): Oza Blumenthal, MD  Anesthesia: General  Staff:  Circulator: Altamease Asters, RN Scrub Person: Gaynell Keeler  Estimated Blood Loss: Minimal               Findings: The patient was found to have a large indirect left inguinal hernia and a smaller direct inguinal hernia.  Both sides were repaired with large 3D max Prolene mesh from [employer]  Procedure: The patient was brought to the operating identifies the correct patient.  He was placed upon the operating table and general anesthesia was induced.  His abdomen was then prepped and draped in the usual sterile fashion.  I made a vertical incision below the umbilicus with a scalpel.  I carried this down to the fascia which was opened just to the left of the midline.  The rectus muscle was then elevated.  I passed the dissecting balloon underneath the rectus muscle and manipulated toward the pubis.  I then insufflated the dissecting balloon under direct vision dissecting out the preperitoneal space.  I then removed the dissecting balloon and insufflation was began with carbon dioxide.  I placed two 5 mm trocars in the patient's midline both under direct vision.  The patient's left epigastric vessel had dropped down from the abdominal wall.  There was what appeared to be a small branch of it versus adhesion which I had to clip with surgical clips and transect to better visualize the left inguinal area.  I then dissected out the left testicular cord and structures.  He had a large hernia sac containing fat which I was able to completely reduce.  I can ease identify the conjoined tendon and Cooper's ligament.  I then dissected out the right inguinal area and the patient was found to have a small direct hernia without evidence of indirect hernia.  I  brought a large piece of right-sided 3D max Prolene mesh onto the field.  I placed it through the umbilical trocar and open as an onlay on the right inguinal floor.  I then tacked it to Cooper's ligament, up the medial abdominal wall, and slightly laterally with the absorbable tacker.  A left sided piece of mesh was then brought to the field and placed through the umbilical trocar as well.  I again placed it as an onlay on the left inguinal floor.  I again tacked it to Cooper's ligament, up the medial abdominal wall, and slightly laterally.  I assured that the hernia sac was dropped down and located over the top of the mesh to avoid recurrence.  At this point, hemostasis appeared to be achieved.  Insufflation was stopped and we watched the preperitoneal space collapsed appropriately with the old hernia sac lying in her correct location and then removed the trocars before the space completely collapsed.  I then removed the umbilical trocar and closed the fascia with a figure-of-eight 0 Vicryl suture at the umbilicus.  All incisions were then anesthetized Marcaine.  I performed bilateral ilioinguinal nerve blocks with Marcaine as well.  I then closed the skin incisions with 4-0 Monocryl sutures and Dermabond.  The patient tolerated the procedure well.  All the counts were correct at the end the procedure.  I evaluated the scrotum and assured that both testicles were in their proper location.  The patient was then taken in stable  condition after extubation from the operating room to the recovery room.          Oza Blumenthal   Date: 11/29/2023  Time: 11:09 AM

## 2023-11-30 ENCOUNTER — Encounter (HOSPITAL_BASED_OUTPATIENT_CLINIC_OR_DEPARTMENT_OTHER): Payer: Self-pay | Admitting: Surgery

## 2023-12-16 ENCOUNTER — Encounter: Payer: Self-pay | Admitting: Family Medicine

## 2023-12-16 ENCOUNTER — Ambulatory Visit: Payer: Medicare HMO | Admitting: Family Medicine

## 2023-12-16 VITALS — BP 110/69 | HR 64 | Temp 98.5°F | Ht 72.0 in | Wt 196.0 lb

## 2023-12-16 DIAGNOSIS — R972 Elevated prostate specific antigen [PSA]: Secondary | ICD-10-CM | POA: Diagnosis not present

## 2023-12-16 DIAGNOSIS — Z125 Encounter for screening for malignant neoplasm of prostate: Secondary | ICD-10-CM

## 2023-12-16 DIAGNOSIS — R7303 Prediabetes: Secondary | ICD-10-CM | POA: Diagnosis not present

## 2023-12-16 DIAGNOSIS — E7800 Pure hypercholesterolemia, unspecified: Secondary | ICD-10-CM

## 2023-12-16 DIAGNOSIS — Z0000 Encounter for general adult medical examination without abnormal findings: Secondary | ICD-10-CM | POA: Diagnosis not present

## 2023-12-16 LAB — PSA, MEDICARE: PSA: 1.55 ng/mL (ref 0.10–4.00)

## 2023-12-16 LAB — POCT GLYCOSYLATED HEMOGLOBIN (HGB A1C)
HbA1c POC (<> result, manual entry): 5.7 % (ref 4.0–5.6)
HbA1c, POC (controlled diabetic range): 5.7 % (ref 0.0–7.0)
HbA1c, POC (prediabetic range): 5.7 % (ref 5.7–6.4)
Hemoglobin A1C: 5.7 % — AB (ref 4.0–5.6)

## 2023-12-16 NOTE — Progress Notes (Signed)
 Office Note 12/16/2023  CC:  Chief Complaint  Patient presents with   Medical Management of Chronic Issues    Pt is not fasting   Patient is a 73 y.o. male who is here for annual health maintenance exam and 41-month follow-up hypercholesterolemia and prediabetes. A/P as of last visit: "#1 prediabetes. Great TLC. POC Hba1c today is 5.7%.   #2 hyperlipidemia. Doing great on a atorvastatin  40 mg a day. Lipid panel and hepatic panel today.   #3 painful plantar callus.  He has been walking a lot more lately and old shoes. He is getting give it a few days of rest and see how it goes. If does not improve he will return and I will shave this down."  INTERIM HX: Gavin [name] is feeling well. He has had his bilateral inguinal hernia surgery, mesh, doing well. He also had the flexor tendon of his second toe on left foot released.  His hammertoe is now gone and he feels much better.  No acute concerns.  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   Cataract    Bilat: has lens implants bilat.   COVID-19 virus infection 04/05/2021   Elevated PSA    prostate bx benign 02/2023   Family history of colon cancer    Brother: in his 30s.   GERD (gastroesophageal reflux disease)    History of fusion of cervical spine    History of left lateral epicondylitis    History of retinal detachment    OU   Hyperlipidemia    Inguinal hernia, left    2025   Migraines    Myocardial infarct (HCC)    2004, when pt's stent clotted.  He got CABG at [facility] at that time.   Prediabetes    04/2019-->HbA1c 5.9% (unchanged compared to 2/3 yrs prior). 5.8% 11/2019  6.2% 05/2020. A1c 5.7% 11/2020.   Pseudophakia    RBBB     Past Surgical History:  Procedure  Laterality Date   CARDIOVASCULAR STRESS TEST  2013   No ischemia.  EF 70%.   CATARACT EXTRACTION, BILATERAL     with lens implants   CERVICAL FUSION  2006   C5/6   CHOLECYSTECTOMY  2004   COLONOSCOPY  09/19/2010; 11/02/13; 04/28/18   2012: repeat 3 yrs,adenomas.  2015 same.  2019 same.  2022 same->Recall 3-5 yrs.   CORONARY ARTERY BYPASS GRAFT  2004   CABGx4   CORONARY STENT PLACEMENT     BMS in 1990s.   HEMORRHOID SURGERY     2024   INGUINAL HERNIA REPAIR Bilateral 11/29/2023   Procedure: REPAIR, HERNIA, INGUINAL, LAPAROSCOPIC;  Surgeon: Oza Blumenthal, MD;  Location: [facility];  Service: General;  Laterality: Bilateral;  LAPAROSCOPIC BILATERAL INGUINAL HERNIA REPAIR WITH MESH   POLYPECTOMY     PROSTATE BIOPSY     03/04/23 benign   RETINAL DETACHMENT SURGERY N/A    2011 & 2013 both eyes affected   TONSILLECTOMY  1956    Family History  Problem Relation Age of Onset   Arthritis Mother    Diabetes Mother    Osteoporosis Mother    Depression Father    Heart attack Father    Heart  disease Father    Hyperlipidemia Father    Hypertension Father    Stroke Father    Aortic aneurysm Father    Cancer Brother    Heart disease Brother    Hyperlipidemia Brother    Hypertension Brother    Colon cancer Brother 30   Colon polyps Brother    Colon polyps Brother    Colon cancer Brother    Diabetes Maternal Grandmother    Early death Paternal Grandfather    Esophageal cancer Neg Hx    Rectal cancer Neg Hx    Stomach cancer Neg Hx     Social History   Socioeconomic History   Marital status: Married    Spouse name: Not on file   Number of children: Not on file   Years of education: Not on file   Highest education level: Master's degree (e.g., MA, MS, MEng, MEd, MSW, MBA)  Occupational History   Not on file  Tobacco Use   Smoking status: Never   Smokeless tobacco: Never  Vaping Use   Vaping status: Never Used  Substance and Sexual Activity   Alcohol  use: Yes    Alcohol/week: 1.0 standard drink of alcohol    Types: 1 Glasses of wine per week    Comment: glass of wine per week   Drug use: Never   Sexual activity: Yes  Other Topics Concern   Not on file  Social History Narrative   Married, 2 grown children.  No grandchildren.   Masters Degree: Public affairs consultant.   Ronald Cockayne; retired Art gallery manager.   Alc: 2 drinks per week.   No tob.   Social Drivers of Corporate investment banker Strain: Low Risk  (07/07/2023)   Overall Financial Resource Strain (CARDIA)    Difficulty of Paying Living Expenses: Not hard at all  Food Insecurity: No Food Insecurity (07/07/2023)   Hunger Vital Sign    Worried About Running Out of Food in the Last Year: Never true    Ran Out of Food in the Last Year: Never true  Transportation Needs: No Transportation Needs (07/07/2023)   PRAPARE - Administrator, Civil Service (Medical): No    Lack of Transportation (Non-Medical): No  Physical Activity: Sufficiently Active (07/07/2023)   Exercise Vital Sign    Days of Exercise per Week: 4 days    Minutes of Exercise per Session: 40 min  Stress: No Stress Concern Present (07/07/2023)   [employer] of Occupational Health - Occupational Stress Questionnaire    Feeling of Stress : Not at all  Social Connections: Moderately Isolated (07/07/2023)   Social Connection and Isolation Panel [NHANES]    Frequency of Communication with Friends and Family: Three times a week    Frequency of Social Gatherings with Friends and Family: Once a week    Attends Religious Services: Never    Database administrator or Organizations: No    Attends Banker Meetings: Never    Marital Status: Married  Catering manager Violence: Not At Risk (07/07/2023)   Humiliation, Afraid, Rape, and Kick questionnaire    Fear of Current or Ex-Partner: No    Emotionally Abused: No    Physically Abused: No    Sexually Abused: No    Outpatient Medications Prior to Visit   Medication Sig Dispense Refill   aspirin EC 81 MG tablet Take 81 mg by mouth daily.     atorvastatin  (LIPITOR) 40 MG tablet Take 1 tablet (40 mg total) by mouth daily.  90 tablet 3   Cholecalciferol (VITAMIN D PO) Take 1 capsule by mouth daily.     famotidine (PEPCID) 40 MG tablet Take 40 mg by mouth daily.     finasteride (PROSCAR) 5 MG tablet Take 5 mg by mouth daily.     tamsulosin (FLOMAX) 0.4 MG CAPS capsule Take 0.4 mg by mouth daily.     traMADol  (ULTRAM ) 50 MG tablet Take 1 tablet (50 mg total) by mouth every 6 (six) hours as needed for moderate pain (pain score 4-6) or severe pain (pain score 7-10). (Patient not taking: Reported on 12/16/2023) 25 tablet 0   No facility-administered medications prior to visit.    Allergies  Allergen Reactions   Penicillins Anaphylaxis    Review of Systems  Constitutional:  Negative for appetite change, chills, fatigue and fever.  HENT:  Negative for congestion, dental problem, ear pain and sore throat.   Eyes:  Negative for discharge, redness and visual disturbance.  Respiratory:  Negative for cough, chest tightness, shortness of breath and wheezing.   Cardiovascular:  Negative for chest pain, palpitations and leg swelling.  Gastrointestinal:  Negative for abdominal pain, blood in stool, diarrhea, nausea and vomiting.  Genitourinary:  Negative for difficulty urinating, dysuria, flank pain, frequency, hematuria and urgency.  Musculoskeletal:  Negative for arthralgias, back pain, joint swelling, myalgias and neck stiffness.  Skin:  Negative for pallor and rash.  Neurological:  Negative for dizziness, speech difficulty, weakness and headaches.  Hematological:  Negative for adenopathy. Does not bruise/bleed easily.  Psychiatric/Behavioral:  Negative for confusion and sleep disturbance. The patient is not nervous/anxious.     PE;    12/16/2023    9:44 AM 11/29/2023   12:34 PM 11/29/2023   12:00 PM  Vitals with BMI  Height 6\' 0"     Weight 196  lbs    BMI 26.58    Systolic 110 100 914  Diastolic 69 65 70  Pulse 64 78 65   Gen: Alert, well appearing.  Patient is oriented to person, place, time, and situation. AFFECT: pleasant, lucid thought and speech. ENT: Ears: EACs clear, normal epithelium.  TMs with good light reflex and landmarks bilaterally.  Eyes: no injection, icteris, swelling, or exudate.  EOMI, PERRLA. Nose: no drainage or turbinate edema/swelling.  No injection or focal lesion.  Mouth: lips without lesion/swelling.  Oral mucosa pink and moist.  Dentition intact and without obvious caries or gingival swelling.  Oropharynx without erythema, exudate, or swelling.  Neck: supple/nontender.  No LAD, mass, or TM.  Carotid pulses 2+ bilaterally, without bruits. CV: RRR, no m/r/g.   LUNGS: CTA bilat, nonlabored resps, good aeration in all lung fields. ABD: soft, NT, ND, BS normal.  No hepatospenomegaly or mass.  No bruits. His 3 small laparoscopy scars look excellent. EXT: no clubbing, cyanosis, or edema.  Musculoskeletal: no joint swelling, erythema, warmth, or tenderness.  ROM of all joints intact. Skin - no sores or suspicious lesions or rashes or color changes  Pertinent labs:  Lab Results  Component Value Date   TSH 3.17 06/10/2020   Lab Results  Component Value Date   WBC 6.0 12/21/2022   HGB 15.1 12/21/2022   HCT 44.7 12/21/2022   MCV 86.0 12/21/2022   PLT 228.0 12/21/2022   Lab Results  Component Value Date   CREATININE 1.04 06/28/2023   BUN 15 06/28/2023   NA 140 06/28/2023   K 4.5 06/28/2023   CL 103 06/28/2023   CO2 29 06/28/2023   Lab  Results  Component Value Date   ALT 21 06/28/2023   AST 19 06/28/2023   ALKPHOS 81 06/28/2023   BILITOT 0.8 06/28/2023   Lab Results  Component Value Date   CHOL 126 06/28/2023   Lab Results  Component Value Date   HDL 51.60 06/28/2023   Lab Results  Component Value Date   LDLCALC 53 06/28/2023   Lab Results  Component Value Date   TRIG 108.0  06/28/2023   Lab Results  Component Value Date   CHOLHDL 2 06/28/2023   Lab Results  Component Value Date   PSA 24.20 09/06/2023   PSA 13.10 01/18/2023   PSA 10.38 (H) 12/21/2022   Lab Results  Component Value Date   HGBA1C 5.7 (A) 12/16/2023   HGBA1C 5.7 12/16/2023   HGBA1C 5.7 12/16/2023   HGBA1C 5.7 12/16/2023   ASSESSMENT AND PLAN:   No problem-specific Assessment & Plan notes found for this encounter.  #1 health maintenance exam: Reviewed age and gender appropriate health maintenance issues (prudent diet, regular exercise, health risks of tobacco and excessive alcohol, use of seatbelts, fire alarms in home, use of sunscreen).  Also reviewed age and gender appropriate health screening as well as vaccine recommendations. Vaccines: all UTD Labs: Hba1c, lipids, cmet, cbc Prostate ca screening: has been evaluated by urologist for history of elevated PSA (bx benign 2024).  Patient concerned that PSA has been rising on checks with the urologist lately.  His next check is going to be in August.  Will check PSA to see trend again today. Colon ca screening: recall 04/2024.  #2 prediabetes. Great TLC. POC Hba1c today is 5.7%--> stable.   #3 hyperlipidemia. Doing great on a atorvastatin  40 mg a day. Lipid panel and hepatic panel today (he last ate some bread and coffee with milk approximately 3 hours ago).  An After Visit Summary was printed and given to the patient.  FOLLOW UP:  Return in about 6 months (around 06/17/2024) for routine chronic illness f/u.  Signed:  Arletha Lady, MD           12/16/2023

## 2023-12-16 NOTE — Patient Instructions (Addendum)
   It was very nice to see you today!   Your A1c today was 5.7  PLEASE NOTE:  If labs were collected or images ordered, we will inform you of  results once we have received them and reviewed. We will contact you either by echart message, or telephone call.     If we ordered any referrals today, please let us  know if you have not heard from their office within the next 2 weeks. You should receive a letter via MyChart confirming if the referral was approved and their office contact information to schedule.

## 2023-12-17 LAB — COMPREHENSIVE METABOLIC PANEL WITH GFR
AG Ratio: 2.1 (calc) (ref 1.0–2.5)
ALT: 19 U/L (ref 9–46)
AST: 20 U/L (ref 10–35)
Albumin: 4.4 g/dL (ref 3.6–5.1)
Alkaline phosphatase (APISO): 80 U/L (ref 35–144)
BUN: 14 mg/dL (ref 7–25)
CO2: 29 mmol/L (ref 20–32)
Calcium: 9.6 mg/dL (ref 8.6–10.3)
Chloride: 106 mmol/L (ref 98–110)
Creat: 1.03 mg/dL (ref 0.70–1.28)
Globulin: 2.1 g/dL (ref 1.9–3.7)
Glucose, Bld: 75 mg/dL (ref 65–99)
Potassium: 4.5 mmol/L (ref 3.5–5.3)
Sodium: 141 mmol/L (ref 135–146)
Total Bilirubin: 1 mg/dL (ref 0.2–1.2)
Total Protein: 6.5 g/dL (ref 6.1–8.1)
eGFR: 77 mL/min/{1.73_m2} (ref >=60)

## 2023-12-17 LAB — CBC WITH DIFFERENTIAL/PLATELET
Absolute Lymphocytes: 1657 {cells}/uL (ref 850–3900)
Absolute Monocytes: 746 {cells}/uL (ref 200–950)
Basophils Absolute: 20 {cells}/uL (ref 0–200)
Basophils Relative: 0.3 %
Eosinophils Absolute: 198 {cells}/uL (ref 15–500)
Eosinophils Relative: 3 %
HCT: 46.9 % (ref 38.5–50.0)
Hemoglobin: 15.1 g/dL (ref 13.2–17.1)
MCH: 28 pg (ref 27.0–33.0)
MCHC: 32.2 g/dL (ref 32.0–36.0)
MCV: 87 fL (ref 80.0–100.0)
MPV: 9.9 fL (ref 7.5–12.5)
Monocytes Relative: 11.3 %
Neutro Abs: 3980 {cells}/uL (ref 1500–7800)
Neutrophils Relative %: 60.3 %
Platelets: 260 10*3/uL (ref 140–400)
RBC: 5.39 10*6/uL (ref 4.20–5.80)
RDW: 13.1 % (ref 11.0–15.0)
Total Lymphocyte: 25.1 %
WBC: 6.6 10*3/uL (ref 3.8–10.8)

## 2023-12-17 LAB — LIPID PANEL
Cholesterol: 128 mg/dL (ref <200)
HDL: 61 mg/dL (ref >=40)
LDL Cholesterol (Calc): 44 mg/dL
Non-HDL Cholesterol (Calc): 67 mg/dL (ref <130)
Total CHOL/HDL Ratio: 2.1 (calc) (ref <5.0)
Triglycerides: 144 mg/dL (ref <150)

## 2023-12-22 ENCOUNTER — Ambulatory Visit: Payer: Self-pay | Admitting: Family Medicine

## 2023-12-22 NOTE — Telephone Encounter (Signed)
 No further action needed.

## 2024-01-07 ENCOUNTER — Encounter: Payer: Self-pay | Admitting: Family Medicine

## 2024-02-18 DIAGNOSIS — H5319 Other subjective visual disturbances: Secondary | ICD-10-CM | POA: Diagnosis not present

## 2024-02-18 DIAGNOSIS — H5702 Anisocoria: Secondary | ICD-10-CM | POA: Diagnosis not present

## 2024-02-18 DIAGNOSIS — H35372 Puckering of macula, left eye: Secondary | ICD-10-CM | POA: Diagnosis not present

## 2024-02-18 DIAGNOSIS — Z961 Presence of intraocular lens: Secondary | ICD-10-CM | POA: Diagnosis not present

## 2024-02-18 DIAGNOSIS — H31093 Other chorioretinal scars, bilateral: Secondary | ICD-10-CM | POA: Diagnosis not present

## 2024-02-28 DIAGNOSIS — R972 Elevated prostate specific antigen [PSA]: Secondary | ICD-10-CM | POA: Diagnosis not present

## 2024-03-22 DIAGNOSIS — L814 Other melanin hyperpigmentation: Secondary | ICD-10-CM | POA: Diagnosis not present

## 2024-03-22 DIAGNOSIS — L218 Other seborrheic dermatitis: Secondary | ICD-10-CM | POA: Diagnosis not present

## 2024-03-22 DIAGNOSIS — L300 Nummular dermatitis: Secondary | ICD-10-CM | POA: Diagnosis not present

## 2024-03-22 DIAGNOSIS — L603 Nail dystrophy: Secondary | ICD-10-CM | POA: Diagnosis not present

## 2024-03-22 DIAGNOSIS — L570 Actinic keratosis: Secondary | ICD-10-CM | POA: Diagnosis not present

## 2024-03-22 DIAGNOSIS — L821 Other seborrheic keratosis: Secondary | ICD-10-CM | POA: Diagnosis not present

## 2024-03-22 DIAGNOSIS — D225 Melanocytic nevi of trunk: Secondary | ICD-10-CM | POA: Diagnosis not present

## 2024-03-23 ENCOUNTER — Encounter: Payer: Self-pay | Admitting: Family Medicine

## 2024-04-03 ENCOUNTER — Encounter: Payer: Self-pay | Admitting: Family Medicine

## 2024-06-14 ENCOUNTER — Ambulatory Visit (INDEPENDENT_AMBULATORY_CARE_PROVIDER_SITE_OTHER): Admitting: *Deleted

## 2024-06-14 VITALS — Ht 72.0 in | Wt 196.0 lb

## 2024-06-14 DIAGNOSIS — Z0000 Encounter for general adult medical examination without abnormal findings: Secondary | ICD-10-CM

## 2024-06-14 NOTE — Progress Notes (Signed)
 Chief Complaint  Patient presents with   Medicare Wellness     Subjective:   Gavin [name] is a 73 y.o. male who presents for a Medicare Annual Wellness Visit.  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   Cataract    Bilat: has lens implants bilat.   COVID-19 virus infection 04/05/2021   Elevated PSA    prostate bx benign 02/2023   Family history of colon cancer    Brother: in his 30s.   GERD (gastroesophageal reflux disease)    History of fusion of cervical spine    History of left lateral epicondylitis    History of retinal detachment    OU   Hyperlipidemia    Inguinal hernia, left    2025   Migraines    Myocardial infarct (HCC)    2004, when pt's stent clotted.  He got CABG at [facility] at that time.   Prediabetes    04/2019-->HbA1c 5.9% (unchanged compared to 2/3 yrs prior). 5.8% 11/2019  6.2% 05/2020. A1c 5.7% 11/2020.   Pseudophakia    RBBB    Past Surgical History:  Procedure Laterality Date   CARDIOVASCULAR STRESS TEST  2013   No ischemia.  EF 70%.   CATARACT EXTRACTION, BILATERAL     with lens implants   CERVICAL FUSION  2006   C5/6   CHOLECYSTECTOMY  2004   COLONOSCOPY  09/19/2010; 11/02/13; 04/28/18   2012: repeat 3 yrs,adenomas.  2015 same.  2019 same.  2022 same->Recall 3-5 yrs.   CORONARY ARTERY BYPASS GRAFT  2004   CABGx4   CORONARY STENT PLACEMENT     BMS in 1990s.   HEMORRHOID SURGERY     2024   INGUINAL HERNIA REPAIR Bilateral 11/29/2023   Procedure: REPAIR, HERNIA, INGUINAL, LAPAROSCOPIC;  Surgeon: Vernetta Berg, MD;  Location:  SURGERY CENTER;  Service: General;  Laterality: Bilateral;  LAPAROSCOPIC BILATERAL INGUINAL HERNIA REPAIR  WITH MESH   POLYPECTOMY     PROSTATE BIOPSY     03/04/23 benign   RETINAL DETACHMENT SURGERY N/A    2011 & 2013 both eyes affected   TONSILLECTOMY  1956   Family History  Problem Relation Age of Onset   Arthritis Mother    Diabetes Mother    Osteoporosis Mother    Depression Father    Heart attack Father    Heart disease Father    Hyperlipidemia Father    Hypertension Father    Stroke Father    Aortic aneurysm Father    Cancer Brother    Heart disease Brother    Hyperlipidemia Brother    Hypertension Brother    Colon cancer Brother 30   Colon polyps Brother    Colon polyps Brother    Colon cancer Brother    Diabetes Maternal Grandmother    Early death Paternal Grandfather    Esophageal cancer Neg Hx    Rectal cancer Neg Hx    Stomach cancer Neg Hx    Social History   Occupational History   Not on file  Tobacco Use   Smoking status: Never   Smokeless tobacco: Never  Vaping Use   Vaping status:  Never Used  Substance and Sexual Activity   Alcohol use: Yes    Alcohol/week: 1.0 standard drink of alcohol    Types: 1 Glasses of wine per week    Comment: glass of wine per week   Drug use: Never   Sexual activity: Yes   Tobacco Counseling Counseling given: Not Answered  SDOH Screenings   Food Insecurity: No Food Insecurity (06/14/2024)  Housing: Low Risk  (06/14/2024)  Transportation Needs: No Transportation Needs (06/14/2024)  Utilities: Not At Risk (06/14/2024)  Alcohol Screen: Low Risk  (06/13/2024)  Depression (PHQ2-9): Low Risk  (06/14/2024)  Financial Resource Strain: Low Risk  (06/13/2024)  Physical Activity: Sufficiently Active (06/14/2024)  Social Connections: Moderately Integrated (06/14/2024)  Stress: No Stress Concern Present (06/14/2024)  Tobacco Use: Low Risk  (06/14/2024)  Health Literacy: Adequate Health Literacy (06/14/2024)   See flowsheets for full screening details  Depression Screen PHQ 2 & 9 Depression Scale- Over the past 2 weeks,  how often have you been bothered by any of the following problems? Little interest or pleasure in doing things: 0 Feeling down, depressed, or hopeless (PHQ Adolescent also includes...irritable): 0 PHQ-2 Total Score: 0 Trouble falling or staying asleep, or sleeping too much: 0 Feeling tired or having little energy: 0 Poor appetite or overeating (PHQ Adolescent also includes...weight loss): 0 Feeling bad about yourself - or that you are a failure or have let yourself or your family down: 0 Trouble concentrating on things, such as reading the newspaper or watching television (PHQ Adolescent also includes...like school work): 0 Moving or speaking so slowly that other people could have noticed. Or the opposite - being so fidgety or restless that you have been moving around a lot more than usual: 0 Thoughts that you would be better off dead, or of hurting yourself in some way: 0 PHQ-9 Total Score: 0 If you checked off any problems, how difficult have these problems made it for you to do your work, take care of things at home, or get along with other people?: Not difficult at all     Goals Addressed             This Visit's Progress    Patient Stated   On track    Continue eating healthy & maintain current level of activity     Patient Stated   On track    Get back to being active      Patient Stated   On track    Stay healthy  Finish hobbies     Patient Stated       Stay healthy       Visit info / Clinical Intake: Medicare Wellness Visit Type:: Subsequent Annual Wellness Visit Persons participating in visit:: patient Medicare Wellness Visit Mode:: Telephone If telephone:: video declined Because this visit was a virtual/telehealth visit:: unable to obtan vitals due to lack of equipment If Telephone or Video please confirm:: I connected with the patient using audio enabled telemedicine application and verified that I am speaking with the correct person using two identifiers; I  discussed the limitations of evaluation and management by telemedicine; The patient expressed understanding and agreed to proceed Patient Location:: home Provider Location:: home Information given by:: patient Interpreter Needed?: No Pre-visit prep was completed: no AWV questionnaire completed by patient prior to visit?: yes Date:: 06/12/24 Living arrangements:: lives with spouse/significant other Patient's Overall Health Status Rating: good Typical amount of pain: none Does pain affect daily life?: no Are you currently prescribed opioids?:  no  Dietary Habits and Nutritional Risks How many meals a day?: 2 Eats fruit and vegetables daily?: yes Most meals are obtained by: preparing own meals In the last 2 weeks, have you had any of the following?: none Diabetic:: no  Functional Status Activities of Daily Living (to include ambulation/medication): (Patient-Rptd) Independent Ambulation: (Patient-Rptd) Independent Medication Administration: Independent Home Management: (Patient-Rptd) Independent Manage your own finances?: yes Primary transportation is: driving Concerns about vision?: no *vision screening is required for WTM* Concerns about hearing?: no  Fall Screening Falls in the past year?: (Patient-Rptd) 0 Number of falls in past year: (Patient-Rptd) 0 Was there an injury with Fall?: (Patient-Rptd) 0 Fall Risk Category Calculator: (Patient-Rptd) 0 Patient Fall Risk Level: (Patient-Rptd) Low Fall Risk  Fall Risk Patient at Risk for Falls Due to: No Fall Risks Fall risk Follow up: Falls evaluation completed; Education provided; Falls prevention discussed  Home and Transportation Safety: All rugs have non-skid backing?: yes All stairs or steps have railings?: yes Grab bars in the bathtub or shower?: (!) no Have non-skid surface in bathtub or shower?: yes Good home lighting?: yes Regular seat belt use?: yes Hospital stays in the last year:: no  Cognitive  Assessment Difficulty concentrating, remembering, or making decisions? : no Will 6CIT or Mini Cog be Completed: yes What year is it?: 0 points What month is it?: 0 points Give patient an address phrase to remember (5 components): Its very sunny outside today in November About what time is it?: 0 points Count backwards from 20 to 1: 0 points Say the months of the year in reverse: 0 points Repeat the address phrase from earlier: 0 points 6 CIT Score: 0 points  Advance Directives (For Healthcare) Does Patient Have a Medical Advance Directive?: Yes Does patient want to make changes to medical advance directive?: No - Guardian declined Type of Advance Directive: Healthcare Power of [employer] of Healthcare Power of Attorney in Chart?: No - copy requested Would patient like information on creating a medical advance directive?: No - Patient declined  Reviewed/Updated  Reviewed/Updated: Reviewed All (Medical, Surgical, Family, Medications, Allergies, Care Teams, Patient Goals); Surgical History; Family History; Medications; Allergies; Care Teams; Patient Goals; Medical History        Objective:    Today's Vitals   06/14/24 1406  Weight: 196 lb (88.9 kg)  Height: 6' (1.829 m)   Body mass index is 26.58 kg/m.  Current Medications (verified) Outpatient Encounter Medications as of 06/14/2024  Medication Sig   aspirin EC 81 MG tablet Take 81 mg by mouth daily.   atorvastatin  (LIPITOR) 40 MG tablet Take 1 tablet (40 mg total) by mouth daily.   Cholecalciferol (VITAMIN D PO) Take 1 capsule by mouth daily.   famotidine (PEPCID) 40 MG tablet Take 40 mg by mouth daily.   tamsulosin (FLOMAX) 0.4 MG CAPS capsule Take 0.4 mg by mouth daily.   finasteride (PROSCAR) 5 MG tablet Take 5 mg by mouth daily.   traMADol  (ULTRAM ) 50 MG tablet Take 1 tablet (50 mg total) by mouth every 6 (six) hours as needed for moderate pain (pain score 4-6) or severe pain (pain score 7-10). (Patient not taking:  Reported on 12/16/2023)   No facility-administered encounter medications on file as of 06/14/2024.   Hearing/Vision screen Hearing Screening - Comments:: No trouble hearing Vision Screening - Comments:: [place] Up to date Immunizations and Health Maintenance Health Maintenance  Topic Date Due   OPHTHALMOLOGY EXAM  10/19/2023   COVID-19 Vaccine (11 - [employer]  risk 2025-26 season) 10/01/2024   Medicare Annual Wellness (AWV)  06/14/2025   Colonoscopy  05/09/2026   DTaP/Tdap/Td (4 - Td or Tdap) 10/29/2031   Pneumococcal Vaccine: 50+ Years  Completed   Influenza Vaccine  Completed   Zoster Vaccines- Shingrix  Completed   Meningococcal B Vaccine  Aged Out   Hepatitis B Vaccines 19-59 Average Risk  Discontinued   Hepatitis C Screening  Discontinued        Assessment/Plan:  This is a routine wellness examination for Dong.  Patient Care Team: Candise Aleene DEL, MD as PCP - General (Family Medicine) Court Dorn PARAS, MD as PCP - Cardiology (Cardiology) Court Dorn PARAS, MD as Consulting Physician (Cardiology) Pyrtle, Gordy HERO, MD as Consulting Physician (Gastroenterology) Devere Lonni Righter, MD as Consulting Physician (Urology)  I have personally reviewed and noted the following in the patient's chart:   Medical and social history Use of alcohol, tobacco or illicit drugs  Current medications and supplements including opioid prescriptions. Functional ability and status Nutritional status Physical activity Advanced directives List of other physicians Hospitalizations, surgeries, and ER visits in previous 12 months Vitals Screenings to include cognitive, depression, and falls Referrals and appointments  No orders of the defined types were placed in this encounter.  In addition, I have reviewed and discussed with patient certain preventive protocols, quality metrics, and best practice recommendations. A written personalized care plan for preventive services as well as general  preventive health recommendations were provided to patient.   Mliss Graff, LPN   [id number]   Return in 1 year (on 06/14/2025).  After Visit Summary: (MyChart) Due to this being a telephonic visit, the after visit summary with patients personalized plan was offered to patient via MyChart   Nurse Notes:

## 2024-06-14 NOTE — Patient Instructions (Signed)
 Mr. Gavin [name],  Thank you for taking the time for your Medicare Wellness Visit. I appreciate your continued commitment to your health goals. Please review the care plan we discussed, and feel free to reach out if I can assist you further.  Please note that Annual Wellness Visits do not include a physical exam. Some assessments may be limited, especially if the visit was conducted virtually. If needed, we may recommend an in-person follow-up with your provider.  Ongoing Care Seeing your primary care provider every 3 to 6 months helps us  monitor your health and provide consistent, personalized care.   Referrals If a referral was made during today's visit and you haven't received any updates within two weeks, please contact the referred provider directly to check on the status.  Recommended Screenings:  Health Maintenance  Topic Date Due   Eye exam for diabetics  10/19/2023   COVID-19 Vaccine (11 - Pfizer risk 2025-26 season) 10/01/2024   Medicare Annual Wellness Visit  06/14/2025   Colon Cancer Screening  05/09/2026   DTaP/Tdap/Td vaccine (4 - Td or Tdap) 10/29/2031   Pneumococcal Vaccine for age over 68  Completed   Flu Shot  Completed   Zoster (Shingles) Vaccine  Completed   Meningitis B Vaccine  Aged Out   Hepatitis B Vaccine  Discontinued   Hepatitis C Screening  Discontinued       06/13/2024    8:26 AM  Advanced Directives  Does Patient Have a Medical Advance Directive? Yes  Type of Advance Directive Healthcare Power of Attorney  Does patient want to make changes to medical advance directive? No - Guardian declined  Copy of Healthcare Power of Attorney in Chart? No - copy requested    Vision: Annual vision screenings are recommended for early detection of glaucoma, cataracts, and diabetic retinopathy. These exams can also reveal signs of chronic conditions such as diabetes and high blood pressure.  Dental: Annual dental screenings help detect early signs of oral cancer, gum  disease, and other conditions linked to overall health, including heart disease and diabetes.  Please see the attached documents for additional preventive care recommendations.    Mr. Gavin [name] , Thank you for taking time to come for your Medicare Wellness Visit. I appreciate your ongoing commitment to your health goals. Please review the following plan we discussed and let me know if I can assist you in the future.   Screening recommendations/referrals: Colonoscopy:  Recommended yearly ophthalmology/optometry visit for glaucoma screening and checkup Recommended yearly dental visit for hygiene and checkup  Vaccinations: Influenza vaccine:  Pneumococcal vaccine:  Tdap vaccine:  Shingles vaccine:       Preventive Care 65 Years and Older, Male Preventive care refers to lifestyle choices and visits with your health care provider that can promote health and wellness. What does preventive care include? A yearly physical exam. This is also called an annual well check. Dental exams once or twice a year. Routine eye exams. Ask your health care provider how often you should have your eyes checked. Personal lifestyle choices, including: Daily care of your teeth and gums. Regular physical activity. Eating a healthy diet. Avoiding tobacco and drug use. Limiting alcohol use. Practicing safe sex. Taking low doses of aspirin every day. Taking vitamin and mineral supplements as recommended by your health care provider. What happens during an annual well check? The services and screenings done by your health care provider during your annual well check will depend on your age, overall health, lifestyle risk factors, and family  history of disease. Counseling  Your health care provider may ask you questions about your: Alcohol use. Tobacco use. Drug use. Emotional well-being. Home and relationship well-being. Sexual activity. Eating habits. History of falls. Memory and ability to understand  (cognition). Work and work astronomer. Screening  You may have the following tests or measurements: Height, weight, and BMI. Blood pressure. Lipid and cholesterol levels. These may be checked every 5 years, or more frequently if you are over 15 years old. Skin check. Lung cancer screening. You may have this screening every year starting at age 66 if you have a 30-pack-year history of smoking and currently smoke or have quit within the past 15 years. Fecal occult blood test (FOBT) of the stool. You may have this test every year starting at age 84. Flexible sigmoidoscopy or colonoscopy. You may have a sigmoidoscopy every 5 years or a colonoscopy every 10 years starting at age 64. Prostate cancer screening. Recommendations will vary depending on your family history and other risks. Hepatitis C blood test. Hepatitis B blood test. Sexually transmitted disease (STD) testing. Diabetes screening. This is done by checking your blood sugar (glucose) after you have not eaten for a while (fasting). You may have this done every 1-3 years. Abdominal aortic aneurysm (AAA) screening. You may need this if you are a current or former smoker. Osteoporosis. You may be screened starting at age 57 if you are at high risk. Talk with your health care provider about your test results, treatment options, and if necessary, the need for more tests. Vaccines  Your health care provider may recommend certain vaccines, such as: Influenza vaccine. This is recommended every year. Tetanus, diphtheria, and acellular pertussis (Tdap, Td) vaccine. You may need a Td booster every 10 years. Zoster vaccine. You may need this after age 53. Pneumococcal 13-valent conjugate (PCV13) vaccine. One dose is recommended after age 61. Pneumococcal polysaccharide (PPSV23) vaccine. One dose is recommended after age 73. Talk to your health care provider about which screenings and vaccines you need and how often you need them. This  information is not intended to replace advice given to you by your health care provider. Make sure you discuss any questions you have with your health care provider. Document Released: 08/02/2015 Document Revised: 03/25/2016 Document Reviewed: 05/07/2015 Elsevier Interactive Patient Education  2017 [employer].  Fall Prevention in the Home Falls can cause injuries. They can happen to people of all ages. There are many things you can do to make your home safe and to help prevent falls. What can I do on the outside of my home? Regularly fix the edges of walkways and driveways and fix any cracks. Remove anything that might make you trip as you walk through a door, such as a raised step or threshold. Trim any bushes or trees on the path to your home. Use bright outdoor lighting. Clear any walking paths of anything that might make someone trip, such as rocks or tools. Regularly check to see if handrails are loose or broken. Make sure that both sides of any steps have handrails. Any raised decks and porches should have guardrails on the edges. Have any leaves, snow, or ice cleared regularly. Use sand or salt on walking paths during winter. Clean up any spills in your garage right away. This includes oil or grease spills. What can I do in the bathroom? Use night lights. Install grab bars by the toilet and in the tub and shower. Do not use towel bars as grab bars.  Use non-skid mats or decals in the tub or shower. If you need to sit down in the shower, use a plastic, non-slip stool. Keep the floor dry. Clean up any water that spills on the floor as soon as it happens. Remove soap buildup in the tub or shower regularly. Attach bath mats securely with double-sided non-slip rug tape. Do not have throw rugs and other things on the floor that can make you trip. What can I do in the bedroom? Use night lights. Make sure that you have a light by your bed that is easy to reach. Do not use any sheets or  blankets that are too big for your bed. They should not hang down onto the floor. Have a firm chair that has side arms. You can use this for support while you get dressed. Do not have throw rugs and other things on the floor that can make you trip. What can I do in the kitchen? Clean up any spills right away. Avoid walking on wet floors. Keep items that you use a lot in easy-to-reach places. If you need to reach something above you, use a strong step stool that has a grab bar. Keep electrical cords out of the way. Do not use floor polish or wax that makes floors slippery. If you must use wax, use non-skid floor wax. Do not have throw rugs and other things on the floor that can make you trip. What can I do with my stairs? Do not leave any items on the stairs. Make sure that there are handrails on both sides of the stairs and use them. Fix handrails that are broken or loose. Make sure that handrails are as long as the stairways. Check any carpeting to make sure that it is firmly attached to the stairs. Fix any carpet that is loose or worn. Avoid having throw rugs at the top or bottom of the stairs. If you do have throw rugs, attach them to the floor with carpet tape. Make sure that you have a light switch at the top of the stairs and the bottom of the stairs. If you do not have them, ask someone to add them for you. What else can I do to help prevent falls? Wear shoes that: Do not have high heels. Have rubber bottoms. Are comfortable and fit you well. Are closed at the toe. Do not wear sandals. If you use a stepladder: Make sure that it is fully opened. Do not climb a closed stepladder. Make sure that both sides of the stepladder are locked into place. Ask someone to hold it for you, if possible. Clearly mark and make sure that you can see: Any grab bars or handrails. First and last steps. Where the edge of each step is. Use tools that help you move around (mobility aids) if they are  needed. These include: Canes. Walkers. Scooters. Crutches. Turn on the lights when you go into a dark area. Replace any light bulbs as soon as they burn out. Set up your furniture so you have a clear path. Avoid moving your furniture around. If any of your floors are uneven, fix them. If there are any pets around you, be aware of where they are. Review your medicines with your doctor. Some medicines can make you feel dizzy. This can increase your chance of falling. Ask your doctor what other things that you can do to help prevent falls. This information is not intended to replace advice given to you by your health  care provider. Make sure you discuss any questions you have with your health care provider. Document Released: 05/02/2009 Document Revised: 12/12/2015 Document Reviewed: 08/10/2014 Elsevier Interactive Patient Education  2017 [employer].

## 2024-06-28 ENCOUNTER — Encounter: Payer: Self-pay | Admitting: Family Medicine

## 2024-06-28 ENCOUNTER — Ambulatory Visit: Admitting: Family Medicine

## 2024-06-28 VITALS — BP 104/70 | HR 70 | Temp 98.2°F | Ht 72.0 in | Wt 198.2 lb

## 2024-06-28 DIAGNOSIS — E7800 Pure hypercholesterolemia, unspecified: Secondary | ICD-10-CM

## 2024-06-28 DIAGNOSIS — Z125 Encounter for screening for malignant neoplasm of prostate: Secondary | ICD-10-CM | POA: Diagnosis not present

## 2024-06-28 DIAGNOSIS — R682 Dry mouth, unspecified: Secondary | ICD-10-CM | POA: Diagnosis not present

## 2024-06-28 DIAGNOSIS — R7303 Prediabetes: Secondary | ICD-10-CM | POA: Diagnosis not present

## 2024-06-28 LAB — PSA, MEDICARE: PSA: 3.99 ng/mL (ref 0.10–4.00)

## 2024-06-28 NOTE — Progress Notes (Signed)
 OFFICE VISIT  06/28/2024  CC:  Chief Complaint  Patient presents with   Annual Exam    Pt is fasting    Patient is a 73 y.o. male who presents for 62-month follow-up hypercholesterolemia and prediabetes. A/P as of last visit: #1 prediabetes. Great TLC. POC Hba1c today is 5.7%--> stable.   #2 hyperlipidemia. Doing great on a atorvastatin  40 mg a day. Lipid panel and hepatic panel today (he last ate some bread and coffee with milk approximately 3 hours ago).  INTERIM HX: Gavin [name] is feeling very well other than struggling with dry mouth.  He says it started about a year ago when he got a dental procedure and some retainers.  Initially had excessive salivation but after short period that he has had significantly dry mouth and change in taste.  His change in taste affects meat flavor, sweets, and salt.   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   Cataract    Bilat: has lens implants bilat.   COVID-19 virus infection 04/05/2021   Depression 1988   Elevated PSA    prostate bx benign 02/2023   Family history of colon cancer    Brother: in his 30s.   GERD (gastroesophageal reflux disease)    History of fusion of cervical spine    History of left lateral epicondylitis    History of retinal detachment    OU   Hyperlipidemia    Hypertension 1980   Inguinal hernia, left    2025   Migraines    Myocardial infarct (HCC)    2004, when pt's stent clotted.  He got [facility] at [facility] at that time.   Prediabetes    04/2019-->HbA1c 5.9% (unchanged compared to 2/3 yrs prior). 5.8% 11/2019  6.2% 05/2020. A1c 5.7% 11/2020.   Pseudophakia    RBBB     Past Surgical History:  Procedure Laterality Date    CARDIOVASCULAR STRESS TEST  2013   No ischemia.  EF 70%.   CATARACT EXTRACTION, BILATERAL     with lens implants   CERVICAL FUSION  2006   C5/6   CHOLECYSTECTOMY  2004   COLON SURGERY  1986 ff   polyps   COLONOSCOPY  09/19/2010; 11/02/13; 04/28/18   2012: repeat 3 yrs,[facility].  2015 same.  2019 same.  2022 same->Recall 3-5 yrs.   CORONARY ARTERY BYPASS GRAFT  2004   CABGx4   CORONARY STENT PLACEMENT     BMS in 1990s.   EYE SURGERY  2010-2012   lens replacement x2, torn retina x2   HEMORRHOID SURGERY     2024   HERNIA REPAIR     INGUINAL HERNIA REPAIR Bilateral 11/29/2023   Procedure: REPAIR, HERNIA, INGUINAL, LAPAROSCOPIC;  Surgeon: Vernetta Berg, MD;  Location: [facility];  Service: General;  Laterality: Bilateral;  LAPAROSCOPIC BILATERAL INGUINAL HERNIA REPAIR WITH MESH   POLYPECTOMY     PROSTATE BIOPSY     03/04/23 benign   RETINAL DETACHMENT SURGERY N/A    2011 & 2013 both eyes affected   SPINE SURGERY  2006   fusion c5/c6   TONSILLECTOMY  1956    Outpatient Medications Prior to  Visit  Medication Sig Dispense Refill   aspirin EC 81 MG tablet Take 81 mg by mouth daily.     atorvastatin  (LIPITOR) 40 MG tablet Take 1 tablet (40 mg total) by mouth daily. 90 tablet 3   Cholecalciferol (VITAMIN D PO) Take 1 capsule by mouth daily.     famotidine (PEPCID) 40 MG tablet Take 40 mg by mouth daily.     tamsulosin (FLOMAX) 0.4 MG CAPS capsule Take 0.4 mg by mouth daily.     finasteride (PROSCAR) 5 MG tablet Take 5 mg by mouth daily.     traMADol  (ULTRAM ) 50 MG tablet Take 1 tablet (50 mg total) by mouth every 6 (six) hours as needed for moderate pain (pain score 4-6) or severe pain (pain score 7-10). (Patient not taking: Reported on 12/16/2023) 25 tablet 0   No facility-administered medications prior to visit.    Allergies  Allergen Reactions   Penicillins Anaphylaxis    Review of Systems As per HPI  PE:    06/28/2024    7:58 AM 06/14/2024    2:06 PM  12/16/2023    9:44 AM  Vitals with BMI  Height 6' 0 6' 0 6' 0  Weight 198 lbs 3 oz 196 lbs 196 lbs  BMI 26.87 26.58 26.58  Systolic 104  110  Diastolic 70  69  Pulse 70  64     Physical Exam  Gen: Alert, well appearing.  Patient is oriented to person, place, time, and situation. AFFECT: pleasant, lucid thought and speech. Oral cavity moist and pink, without focal lesion or swelling. No palpable enlargement of salivary glands. Neck: No bruits. Cardiovascular: Regular rhythm and rate without murmur Lungs are clear bilaterally, breathing nonlabored. Extremities: No edema.  LABS:  Last CBC Lab Results  Component Value Date   WBC 6.6 12/16/2023   HGB 15.1 12/16/2023   HCT 46.9 12/16/2023   MCV 87.0 12/16/2023   MCH 28.0 12/16/2023   RDW 13.1 12/16/2023   PLT 260 12/16/2023   Last metabolic panel Lab Results  Component Value Date   GLUCOSE 75 12/16/2023   NA 141 12/16/2023   K 4.5 12/16/2023   CL 106 12/16/2023   CO2 29 12/16/2023   BUN 14 12/16/2023   CREATININE 1.03 12/16/2023   EGFR 77 12/16/2023   CALCIUM  9.6 12/16/2023   PROT 6.5 12/16/2023   ALBUMIN 4.3 06/28/2023   BILITOT 1.0 12/16/2023   ALKPHOS 81 06/28/2023   AST 20 12/16/2023   ALT 19 12/16/2023   Last lipids Lab Results  Component Value Date   CHOL 128 12/16/2023   HDL 61 12/16/2023   LDLCALC 44 12/16/2023   TRIG 144 12/16/2023   CHOLHDL 2.1 12/16/2023   Last hemoglobin A1c Lab Results  Component Value Date   HGBA1C 5.7 (A) 12/16/2023   HGBA1C 5.7 12/16/2023   HGBA1C 5.7 12/16/2023   HGBA1C 5.7 12/16/2023   Last thyroid functions Lab Results  Component Value Date   TSH 3.17 06/10/2020   Lab Results  Component Value Date   PSA 1.55 12/16/2023   PSA 24.20 09/06/2023   PSA 13.10 01/18/2023   IMPRESSION AND PLAN:  #1 prediabetes. Great TLC. Monitor A1c today.   #2 hyperlipidemia. Doing great on a atorvastatin  40 mg a day. Lipid panel today.  #3 Prostate ca screening: has  been evaluated by urologist for history of elevated PSA (bx benign 2024).  Most recent PSA in May of this year was down to 1.55. PSA today.  #  4 dry mouth and taste changes. This is came directly after getting some dental work but it does not make clear sense.  He has tried discontinuing medications (Flomax and finasteride) but this did not make any difference.  An After Visit Summary was printed and given to the patient.  FOLLOW UP: Return in about 6 months (around 12/27/2024) for annual CPE (fasting). Next CPE 6 months Signed:  Gerlene Hockey, MD           06/28/2024

## 2024-06-29 ENCOUNTER — Ambulatory Visit: Payer: Self-pay | Admitting: Family Medicine

## 2024-06-29 LAB — LIPID PANEL
Cholesterol: 112 mg/dL (ref <200)
HDL: 56 mg/dL (ref >=40)
LDL Cholesterol (Calc): 36 mg/dL
Non-HDL Cholesterol (Calc): 56 mg/dL (ref <130)
Total CHOL/HDL Ratio: 2 (calc) (ref <5.0)
Triglycerides: 115 mg/dL (ref <150)

## 2024-06-29 LAB — HEMOGLOBIN A1C
Hgb A1c MFr Bld: 5.8 % — ABNORMAL HIGH (ref <5.7)
Mean Plasma Glucose: 120 mg/dL
eAG (mmol/L): 6.6 mmol/L

## 2024-06-29 LAB — BASIC METABOLIC PANEL WITH GFR
BUN: 14 mg/dL (ref 7–25)
CO2: 30 mmol/L (ref 20–32)
Calcium: 9.3 mg/dL (ref 8.6–10.3)
Chloride: 104 mmol/L (ref 98–110)
Creat: 1.03 mg/dL (ref 0.70–1.28)
Glucose, Bld: 102 mg/dL — ABNORMAL HIGH (ref 65–99)
Potassium: 4.8 mmol/L (ref 3.5–5.3)
Sodium: 141 mmol/L (ref 135–146)
eGFR: 76 mL/min/1.73m2 (ref >=60)

## 2024-08-03 ENCOUNTER — Other Ambulatory Visit: Payer: Self-pay | Admitting: Family Medicine

## 2024-08-03 NOTE — Telephone Encounter (Signed)
 Spoke with pt to confirm if he was still taking, last refill 06/2023. Pt confirmed he was and the pharmacy listed is correct

## 2024-08-21 ENCOUNTER — Encounter: Payer: Self-pay | Admitting: Cardiovascular Disease

## 2024-08-21 ENCOUNTER — Telehealth: Payer: Self-pay | Admitting: Internal Medicine

## 2024-08-21 NOTE — Telephone Encounter (Signed)
 I have developed what seems to be an external hemorrhoid which is causing me quite a bit of discomfort. It is a bulge right outside the anus. I would like to have it examined and treated

## 2024-08-22 ENCOUNTER — Ambulatory Visit: Admitting: Internal Medicine

## 2024-08-22 ENCOUNTER — Encounter: Payer: Self-pay | Admitting: Internal Medicine

## 2024-08-22 VITALS — BP 114/64 | HR 99 | Ht 72.0 in | Wt 198.5 lb

## 2024-08-22 DIAGNOSIS — K645 Perianal venous thrombosis: Secondary | ICD-10-CM

## 2024-08-22 DIAGNOSIS — K6289 Other specified diseases of anus and rectum: Secondary | ICD-10-CM

## 2024-08-22 DIAGNOSIS — Z860100 Personal history of colon polyps, unspecified: Secondary | ICD-10-CM

## 2024-08-22 DIAGNOSIS — Z860101 Personal history of adenomatous and serrated colon polyps: Secondary | ICD-10-CM | POA: Diagnosis not present

## 2024-08-22 NOTE — Patient Instructions (Addendum)
 Please purchase the following medications over the counter and take as directed: Recti-care Advance cream as needed and hydrocortisone  cream as needed.  Call our if your symptoms are not better.   How to Take a Sitz Bath A sitz bath is a warm water bath that may be used to care for your rectum, genital area, or the area between your rectum and genitals (perineum). In a sitz bath, the water only comes up to your hips and covers your buttocks. A sitz bath may be done in a bathtub or with a portable sitz bath that fits over the toilet. Your health care provider may recommend a sitz bath to help: Relieve pain and discomfort after delivering a baby. Relieve pain and itching from hemorrhoids or anal fissures. Relieve pain after certain surgeries. Relax muscles that are sore or tight. How to take a sitz bath Take 2-4 sitz baths a day, or as many as told by your health care provider. Bathtub sitz bath To take a sitz bath in a bathtub: Partially fill a bathtub with warm water. The water should be deep enough to cover your hips and buttocks when you are sitting in the bathtub. Follow your health care provider's instructions if you are told to put medicine in the water. Sit in the water. Open the bathtub drain a little, and leave it open during your bath. Turn on the warm water again, enough to replace the water that is draining out. Keep the water running throughout your bath. This helps keep the water at the right level and temperature. Soak in the water for 15-20 minutes, or as long as told by your health care provider. When you are done, be careful when you stand up. You may feel dizzy. After the sitz bath, pat yourself dry. Do not rub your skin to dry it.  Over-the-toilet sitz bath To take a sitz bath with an over-the-toilet basin: Follow the manufacturer's instructions. Fill the basin with warm water. Follow your health care provider's instructions if you were told to put medicine in the  water. Sit on the seat. Make sure the water covers your buttocks and perineum. Soak in the water for 15-20 minutes, or as long as told by your health care provider. After the sitz bath, pat yourself dry. Do not rub your skin to dry it. Clean and dry the basin between uses. Discard the basin if it cracks, or according to the manufacturer's instructions.  Contact a health care provider if: Your pain or itching gets worse. Stop doing sitz baths if your symptoms get worse. You have new symptoms. Stop doing sitz baths until you talk with your health care provider. Summary A sitz bath is a warm water bath in which the water only comes up to your hips and covers your buttocks. Your health care provider may recommend a sitz bath to help relieve pain and discomfort after delivering a baby, relieve pain and itching from hemorrhoids or anal fissures, relieve pain after certain surgeries, or help to relax muscles that are sore or tight. Take 2-4 sitz baths a day, or as many as told by your health care provider. Soak in the water for 15-20 minutes. Stop doing sitz baths if your symptoms get worse. This information is not intended to replace advice given to you by your health care provider. Make sure you discuss any questions you have with your health care provider. Document Revised: 10/07/2021 Document Reviewed: 10/07/2021 Elsevier Patient Education  2024 [employer].

## 2024-08-22 NOTE — Progress Notes (Signed)
" ° °  Subjective:    Patient ID: Gavin [name], male    DOB: 1951/01/04, 74 y.o.   MRN: [id number]  HPI Hilmar Moldovan Bryan Goin is a 74 year old male with multiple adenomatous colonic polyps and prior symptomatic internal hemorrhoids who presents for follow-up of rectal pain and discomfort.  He has a history of multiple adenomatous colonic polyps and symptomatic internal hemorrhoids, previously treated with three sessions of hemorrhoid banding from August 2024 to November 2024.  Approximately 7-10 days prior to this visit, difficulty achieving cleanliness after bowel movements developed, requiring the use of wipes, with soreness localized at the anal verge. After sitting for about five hours in a hard chair, pain and a protrusion extending from the anus occurred. The protrusion remains present but is now smaller than initially. Pain and a burning sensation up inside persist, without rectal bleeding. Hydrocortisone  cream has been used approximately three times with persistent discomfort. The patient reports the protrusion feels further out compared to prior episodes.  A bowel movement 2-3 weeks prior to symptom onset was preceded by a strong urge and associated with a full-body spasm, but did not result in immediate pain or protrusion. No recent constipation or hard stools. Stool softeners and fiber supplementation have been used consistently.  He started using 2.5% hydrocortisone  from prior prescription in the last 48 hours. Warm water baths have helped also  Review of Systems As per HPI, otherwise negative  Current Medications, Allergies, Past Medical History, Past Surgical History, Family History and Social History were reviewed in [name] record.    Objective:   Physical Exam BP 114/64   Pulse 99   Ht 6' (1.829 m)   Wt 198 lb 8 oz (90 kg)   BMI 26.92 kg/m  Gen: awake, alert, NAD HEENT: anicteric  Rectal: External right lateral small to medium  sized thrombosed external hemorrhoid; internal exam not performed Neuro: nonfocal        Assessment & Plan:  External hemorrhoid, acute and thrombosed Acute, symptomatic, inflamed external hemorrhoid causing pain and burning without bleeding. No internal hemorrhoid recurrence seen. - Recommended sitz baths. - Advised topical hydrocortisone  2.5% cream 2-3 times daily. - Discussed Recticare advanced (topical lidocaine  plus vasoconstrictor) for significant pain, apply pea-sized amount as needed per instructions on the tube. - Advised consistent bowel habits, stool softeners, and fiber supplementation. - Instructed to call if symptoms do not improve or worsen.  History of adenomatous colon polyps -On surveillance colonoscopy protocol  History of internal hemorrhoids Status post banding, resolved to this point  30 minutes total spent today including patient facing time, coordination of care, reviewing medical history/procedures/pertinent radiology studies, and documentation of the encounter.  "

## 2024-12-27 ENCOUNTER — Ambulatory Visit: Admitting: Family Medicine
